# Patient Record
Sex: Male | Born: 1950 | Race: White | Hispanic: No | Marital: Married | Smoking: Former smoker
Health system: Southern US, Community
[De-identification: ages and names within clinical notes are randomized; demographics above are authoritative.]

## PROBLEM LIST (undated history)

## (undated) DIAGNOSIS — D1391 Familial adenomatous polyposis: Secondary | ICD-10-CM

## (undated) DIAGNOSIS — Z972 Presence of dental prosthetic device (complete) (partial): Secondary | ICD-10-CM

## (undated) DIAGNOSIS — I10 Essential (primary) hypertension: Secondary | ICD-10-CM

## (undated) DIAGNOSIS — D126 Benign neoplasm of colon, unspecified: Secondary | ICD-10-CM

## (undated) DIAGNOSIS — Z7901 Long term (current) use of anticoagulants: Secondary | ICD-10-CM

## (undated) DIAGNOSIS — E0590 Thyrotoxicosis, unspecified without thyrotoxic crisis or storm: Secondary | ICD-10-CM

## (undated) DIAGNOSIS — I2510 Atherosclerotic heart disease of native coronary artery without angina pectoris: Secondary | ICD-10-CM

## (undated) DIAGNOSIS — C61 Malignant neoplasm of prostate: Secondary | ICD-10-CM

## (undated) DIAGNOSIS — E210 Primary hyperparathyroidism: Secondary | ICD-10-CM

## (undated) DIAGNOSIS — H409 Unspecified glaucoma: Secondary | ICD-10-CM

## (undated) DIAGNOSIS — Z860100 Personal history of colon polyps, unspecified: Secondary | ICD-10-CM

## (undated) DIAGNOSIS — Z955 Presence of coronary angioplasty implant and graft: Secondary | ICD-10-CM

## (undated) DIAGNOSIS — Z803 Family history of malignant neoplasm of breast: Secondary | ICD-10-CM

## (undated) DIAGNOSIS — M1990 Unspecified osteoarthritis, unspecified site: Secondary | ICD-10-CM

## (undated) DIAGNOSIS — E785 Hyperlipidemia, unspecified: Secondary | ICD-10-CM

## (undated) DIAGNOSIS — K635 Polyp of colon: Secondary | ICD-10-CM

## (undated) DIAGNOSIS — I219 Acute myocardial infarction, unspecified: Secondary | ICD-10-CM

## (undated) HISTORY — DX: Polyp of colon: K63.5

## (undated) HISTORY — DX: Hyperlipidemia, unspecified: E78.5

## (undated) HISTORY — PX: APPENDECTOMY: SHX54

## (undated) HISTORY — PX: COLONOSCOPY: SHX174

## (undated) HISTORY — PX: TONSILLECTOMY: SUR1361

## (undated) HISTORY — DX: Essential (primary) hypertension: I10

## (undated) HISTORY — DX: Family history of malignant neoplasm of breast: Z80.3

## (undated) HISTORY — DX: Acute myocardial infarction, unspecified: I21.9

## (undated) HISTORY — DX: Familial adenomatous polyposis: D13.91

## (undated) HISTORY — DX: Benign neoplasm of colon, unspecified: D12.6

---
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
## 1972-03-18 HISTORY — PX: APPENDECTOMY: SHX54

## 2007-03-19 DIAGNOSIS — I219 Acute myocardial infarction, unspecified: Secondary | ICD-10-CM

## 2007-03-19 DIAGNOSIS — I252 Old myocardial infarction: Secondary | ICD-10-CM

## 2007-03-19 HISTORY — PX: CORONARY ANGIOPLASTY WITH STENT PLACEMENT: SHX49

## 2007-03-19 HISTORY — DX: Acute myocardial infarction, unspecified: I21.9

## 2007-03-19 HISTORY — DX: Old myocardial infarction: I25.2

## 2008-03-18 DIAGNOSIS — Z955 Presence of coronary angioplasty implant and graft: Secondary | ICD-10-CM

## 2008-03-18 HISTORY — DX: Presence of coronary angioplasty implant and graft: Z95.5

## 2008-03-18 HISTORY — PX: CORONARY ANGIOPLASTY WITH STENT PLACEMENT: SHX49

## 2016-02-06 ENCOUNTER — Encounter: Payer: Self-pay | Admitting: Family Medicine

## 2016-02-06 ENCOUNTER — Encounter: Payer: Self-pay | Admitting: Gastroenterology

## 2016-02-06 ENCOUNTER — Ambulatory Visit (INDEPENDENT_AMBULATORY_CARE_PROVIDER_SITE_OTHER): Payer: Medicare Other | Admitting: Family Medicine

## 2016-02-06 VITALS — BP 180/110 | HR 94 | Temp 98.3°F | Resp 16 | Ht 70.0 in | Wt 197.0 lb

## 2016-02-06 DIAGNOSIS — Z7689 Persons encountering health services in other specified circumstances: Secondary | ICD-10-CM

## 2016-02-06 DIAGNOSIS — I252 Old myocardial infarction: Secondary | ICD-10-CM | POA: Diagnosis not present

## 2016-02-06 DIAGNOSIS — Z1211 Encounter for screening for malignant neoplasm of colon: Secondary | ICD-10-CM | POA: Diagnosis not present

## 2016-02-06 DIAGNOSIS — I10 Essential (primary) hypertension: Secondary | ICD-10-CM

## 2016-02-06 DIAGNOSIS — Z125 Encounter for screening for malignant neoplasm of prostate: Secondary | ICD-10-CM | POA: Diagnosis not present

## 2016-02-06 DIAGNOSIS — Z23 Encounter for immunization: Secondary | ICD-10-CM | POA: Diagnosis not present

## 2016-02-06 DIAGNOSIS — I2510 Atherosclerotic heart disease of native coronary artery without angina pectoris: Secondary | ICD-10-CM | POA: Insufficient documentation

## 2016-02-06 LAB — CBC WITH DIFFERENTIAL/PLATELET
BASOS ABS: 0 {cells}/uL (ref 0–200)
BASOS PCT: 0 %
EOS ABS: 384 {cells}/uL (ref 15–500)
Eosinophils Relative: 4 %
HCT: 48.1 % (ref 38.5–50.0)
HEMOGLOBIN: 16.1 g/dL (ref 13.0–17.0)
LYMPHS ABS: 2592 {cells}/uL (ref 850–3900)
Lymphocytes Relative: 27 %
MCH: 30 pg (ref 27.0–33.0)
MCHC: 33.5 g/dL (ref 32.0–36.0)
MCV: 89.7 fL (ref 80.0–100.0)
MONO ABS: 1632 {cells}/uL — AB (ref 200–950)
MPV: 10.7 fL (ref 7.5–12.5)
Monocytes Relative: 17 %
NEUTROS ABS: 4992 {cells}/uL (ref 1500–7800)
Neutrophils Relative %: 52 %
PLATELETS: 268 10*3/uL (ref 140–400)
RBC: 5.36 MIL/uL (ref 4.20–5.80)
RDW: 13.7 % (ref 11.0–15.0)
WBC: 9.6 10*3/uL (ref 3.8–10.8)

## 2016-02-06 LAB — COMPLETE METABOLIC PANEL WITH GFR
ALBUMIN: 4.3 g/dL (ref 3.6–5.1)
ALK PHOS: 73 U/L (ref 40–115)
ALT: 21 U/L (ref 9–46)
AST: 17 U/L (ref 10–35)
BILIRUBIN TOTAL: 0.4 mg/dL (ref 0.2–1.2)
BUN: 14 mg/dL (ref 7–25)
CO2: 28 mmol/L (ref 20–31)
CREATININE: 0.72 mg/dL (ref 0.70–1.25)
Calcium: 11.4 mg/dL — ABNORMAL HIGH (ref 8.6–10.3)
Chloride: 107 mmol/L (ref 98–110)
GLUCOSE: 100 mg/dL — AB (ref 70–99)
Potassium: 4.7 mmol/L (ref 3.5–5.3)
Sodium: 142 mmol/L (ref 135–146)
TOTAL PROTEIN: 6.9 g/dL (ref 6.1–8.1)

## 2016-02-06 LAB — LIPID PANEL
Cholesterol: 180 mg/dL (ref <200)
HDL: 66 mg/dL (ref >40)
LDL CALC: 79 mg/dL (ref <100)
TRIGLYCERIDES: 175 mg/dL — AB (ref <150)
Total CHOL/HDL Ratio: 2.7 Ratio (ref <5.0)
VLDL: 35 mg/dL — ABNORMAL HIGH (ref <30)

## 2016-02-06 LAB — PSA: PSA: 4.1 ng/mL — AB (ref <=4.0)

## 2016-02-06 MED ORDER — SIMVASTATIN 40 MG PO TABS: 40.0000 mg | ORAL_TABLET | Freq: Every day | ORAL | 3 refills | Status: DC

## 2016-02-06 MED ORDER — LISINOPRIL 20 MG PO TABS: 20.0000 mg | ORAL_TABLET | Freq: Every day | ORAL | 3 refills | Status: DC

## 2016-02-06 MED ORDER — CARVEDILOL 6.25 MG PO TABS: 6.2500 mg | ORAL_TABLET | Freq: Two times a day (BID) | ORAL | 3 refills | Status: DC

## 2016-02-06 NOTE — Progress Notes (Signed)
Subjective:    Patient ID: Todd Patterson, male    DOB: Jun 16, 1950, 65 y.o.   MRN: [id number]  HPI Patient is a very pleasant 65 year old white male here today to establish care. Past medical history is significant for myocardial infarction in 2008. Patient underwent PTCA with stenting.  Patient states that he had a bare-metal stent. He was continued on aspirin and Plavix for 2 years and then was instructed to continue aspirin thereafter. He also has a history of hyperlipidemia and hypertension.  Blood pressure is elevated today significantly. Patient has not taking carvedilol this morning. He is never had prostate cancer screening. He has never had a colonoscopy. He has never had Pneumovax 23 N. Prevnar 13. He is due for a shingles shot. He is also due for a flu shot. History reviewed. No pertinent past medical history. History reviewed. No pertinent surgical history. No current outpatient prescriptions on file prior to visit.   No current facility-administered medications on file prior to visit.    No Known Allergies Social History   Social History  . Marital status: Married    Spouse name: N/A  . Number of children: N/A  . Years of education: N/A   Occupational History  . Not on file.   Social History Main Topics  . Smoking status: Former Smoker    Types: Cigarettes    Quit date: 03/19/2007  . Smokeless tobacco: Never Used  . Alcohol use 1.2 oz/week    2 Cans of beer per week  . Drug use: No  . Sexual activity: Not on file   Other Topics Concern  . Not on file   Social History Narrative  . No narrative on file   History reviewed. No pertinent family history.    Review of Systems  All other systems reviewed and are negative.      Objective:   Physical Exam  Constitutional: He is oriented to person, place, and time. He appears well-developed and well-nourished. No distress.  HENT:  Head: Normocephalic and atraumatic.  Right Ear: External ear normal.  Left Ear:  External ear normal.  Nose: Nose normal.  Mouth/Throat: Oropharynx is clear and moist. No oropharyngeal exudate.  Eyes: Conjunctivae and EOM are normal. Pupils are equal, round, and reactive to light. Right eye exhibits no discharge. Left eye exhibits no discharge. No scleral icterus.  Neck: Normal range of motion. Neck supple. No JVD present. No tracheal deviation present. No thyromegaly present.  Cardiovascular: Normal rate, regular rhythm, normal heart sounds and intact distal pulses.  Exam reveals no gallop and no friction rub.   No murmur heard. Pulmonary/Chest: Effort normal and breath sounds normal. No stridor. No respiratory distress. He has no wheezes. He has no rales. He exhibits no tenderness.  Abdominal: Soft. Bowel sounds are normal. He exhibits no distension and no mass. There is no tenderness. There is no rebound and no guarding.  Musculoskeletal: Normal range of motion. He exhibits no edema, tenderness or deformity.  Lymphadenopathy:    He has no cervical adenopathy.  Neurological: He is alert and oriented to person, place, and time. He has normal reflexes. He displays normal reflexes. No cranial nerve deficit. He exhibits normal muscle tone. Coordination normal.  Skin: Skin is warm. No rash noted. He is not diaphoretic. No erythema. No pallor.  Psychiatric: He has a normal mood and affect. His behavior is normal. Judgment and thought content normal.  Vitals reviewed.         Assessment & Plan:  Benign essential HTN - Plan: lisinopril (PRINIVIL,ZESTRIL) 20 MG tablet, CBC with Differential/Platelet, COMPLETE METABOLIC PANEL WITH GFR, Lipid panel  Prostate cancer screening - Plan: PSA  Need for prophylactic vaccination against Streptococcus pneumoniae (pneumococcus) and influenza - Plan: Flu Vaccine QUAD 36+ mos IM, Pneumococcal polysaccharide vaccine 23-valent greater than or equal to 2yo subcutaneous/IM  Establishing care with new doctor, encounter for  ASCVD  (arteriosclerotic cardiovascular disease) - Plan: Ambulatory referral to Cardiology  History of MI (myocardial infarction) - Plan: Ambulatory referral to Cardiology  Colon cancer screening - Plan: Ambulatory referral to Gastroenterology  First concern is his elevated blood pressure. Begin lisinopril 20 mg by mouth daily and recheck blood pressure in 2 weeks. I would like to consult cardiology to get him established with a local cardiologist. I'll check a CMP and a fasting lipid panel. His goal LDL cholesterol be less than 70. If not at goal, I will switch the patient to high-dose statin such as Lipitor or Crestor. Patient received Pneumovax 23 today. I will give him Prevnar 13 next year. He also received his flu shot. We discussed the shingles shot but he deferred that for now. I will schedule the patient to meet with the gastroenterologist to schedule a colonoscopy. I will check a PSA for prostate cancer screening. Patient also due for AAA screening, will defer that for now until the patient gets established locally with cardiologist

## 2016-02-15 ENCOUNTER — Other Ambulatory Visit: Payer: Self-pay | Admitting: Family Medicine

## 2016-02-19 ENCOUNTER — Other Ambulatory Visit: Payer: Medicare Other

## 2016-02-20 LAB — PTH, INTACT AND CALCIUM
Calcium: 11.1 mg/dL — ABNORMAL HIGH (ref 8.6–10.3)
PTH: 108 pg/mL — ABNORMAL HIGH (ref 14–64)

## 2016-02-26 ENCOUNTER — Encounter: Payer: Self-pay | Admitting: Family Medicine

## 2016-02-26 ENCOUNTER — Ambulatory Visit (INDEPENDENT_AMBULATORY_CARE_PROVIDER_SITE_OTHER): Payer: Medicare Other | Admitting: Family Medicine

## 2016-02-26 VITALS — BP 130/80 | HR 82 | Temp 98.7°F | Resp 18 | Wt 196.0 lb

## 2016-02-26 DIAGNOSIS — R972 Elevated prostate specific antigen [PSA]: Secondary | ICD-10-CM

## 2016-02-26 DIAGNOSIS — I2510 Atherosclerotic heart disease of native coronary artery without angina pectoris: Secondary | ICD-10-CM | POA: Diagnosis not present

## 2016-02-26 DIAGNOSIS — R059 Cough, unspecified: Secondary | ICD-10-CM

## 2016-02-26 DIAGNOSIS — R05 Cough: Secondary | ICD-10-CM

## 2016-02-26 DIAGNOSIS — E213 Hyperparathyroidism, unspecified: Secondary | ICD-10-CM

## 2016-02-26 MED ORDER — HYDROCODONE-HOMATROPINE 5-1.5 MG/5ML PO SYRP: 5.0000 mL | ORAL_SOLUTION | Freq: Three times a day (TID) | ORAL | 0 refills | Status: DC | PRN

## 2016-02-26 NOTE — Progress Notes (Signed)
Subjective:    Patient ID: Todd Patterson, male    DOB: 1950/12/18, 65 y.o.   MRN: [id number]  HPI  Please see the patient's last office visit when he establish care. There was a coincidental finding of an elevated calcium level of 11.4. I had the patient recheck that here and it was confirmed to be elevated 11.1 and his PTH was also elevated at 108. This is consistent with hyperparathyroidism/primary hyperparathyroidism. Patient would like to discuss this prior see an endocrinologist. He has no history of kidney stones. He has no history of osteoporosis that he knows of. He is not taking calcium supplements or any medication that would raise his calcium. He was also found to have an elevated PSA of 4.1. He has not seen a primary care physician in years and therefore no one has been checking his PSA so we have no values to compare to. Otherwise he is completely asymptomatic. He, hesitancy, frequency, or nocturia. He also reports a 5 day history of cough and congestion and rhinorrhea. He denies any shortness of breath or chest pain or fever. Past Medical History:  Diagnosis Date  . CAD (coronary artery disease)   . Hyperlipidemia   . Hypertension   . Myocardial infarction    Past Surgical History:  Procedure Laterality Date  . APPENDECTOMY    . CORONARY ANGIOPLASTY WITH STENT PLACEMENT     Current Outpatient Prescriptions on File Prior to Visit  Medication Sig Dispense Refill  . aspirin 325 MG tablet Take 325 mg by mouth daily.    . carvedilol (COREG) 6.25 MG tablet Take 1 tablet (6.25 mg total) by mouth 2 (two) times daily with a meal. 180 tablet 3  . lisinopril (PRINIVIL,ZESTRIL) 20 MG tablet Take 1 tablet (20 mg total) by mouth daily. 90 tablet 3  . simvastatin (ZOCOR) 40 MG tablet Take 1 tablet (40 mg total) by mouth daily. 90 tablet 3   No current facility-administered medications on file prior to visit.    No Known Allergies Social History   Social History  . Marital status:  Married    Spouse name: N/A  . Number of children: N/A  . Years of education: N/A   Occupational History  . Not on file.   Social History Main Topics  . Smoking status: Former Smoker    Types: Cigarettes    Quit date: 03/19/2007  . Smokeless tobacco: Never Used  . Alcohol use 1.2 oz/week    2 Cans of beer per week  . Drug use: No  . Sexual activity: Not on file   Other Topics Concern  . Not on file   Social History Narrative  . No narrative on file     Review of Systems  All other systems reviewed and are negative.      Objective:   Physical Exam  HENT:  Right Ear: External ear normal.  Left Ear: External ear normal.  Nose: Mucosal edema and rhinorrhea present.  Mouth/Throat: Oropharynx is clear and moist. No oropharyngeal exudate.  Neck: Neck supple.  Cardiovascular: Normal rate, regular rhythm and normal heart sounds.   Pulmonary/Chest: Effort normal and breath sounds normal. No respiratory distress. He has no wheezes. He has no rales.  Vitals reviewed.         Assessment & Plan:  Cough - Plan: HYDROcodone-homatropine (HYCODAN) 5-1.5 MG/5ML syrup  Hyperparathyroidism ([place]) - Plan: Ambulatory referral to Endocrinology  Elevated PSA  Patient's cough is consistent with a viral upper respiratory infection. I  recommended Mucinex DM. He can use Hycodan as needed for breakthrough coughing. Symptoms should improve over the next 3-4 days. I see no indication for serious bacterial illness. 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 I will consult an endocrinologist to discuss management options such as surgery versus monitoring and bisphosphonate therapy. Patient is asymptomatic. Therefore recommended that we recheck his PSA in 6 months to follow his trajectory. If rapidly rising, he would benefit from urology consultation.

## 2016-02-27 ENCOUNTER — Ambulatory Visit: Payer: Medicare Other | Admitting: Family Medicine

## 2016-03-19 ENCOUNTER — Other Ambulatory Visit: Payer: Self-pay | Admitting: *Deleted

## 2016-03-19 ENCOUNTER — Telehealth: Payer: Self-pay | Admitting: Family Medicine

## 2016-03-19 DIAGNOSIS — I10 Essential (primary) hypertension: Secondary | ICD-10-CM

## 2016-03-19 MED ORDER — LISINOPRIL 20 MG PO TABS: 20.0000 mg | ORAL_TABLET | Freq: Every day | ORAL | 3 refills | Status: DC

## 2016-03-19 NOTE — Telephone Encounter (Signed)
Medication called/sent to requested pharmacy  

## 2016-03-19 NOTE — Telephone Encounter (Signed)
[facility] does not have lisinopril on file for patient so if you can call this in for him in a three month supply  [phone]

## 2016-03-26 ENCOUNTER — Ambulatory Visit (AMBULATORY_SURGERY_CENTER): Payer: Self-pay

## 2016-03-26 VITALS — Ht 69.0 in | Wt 196.6 lb

## 2016-03-26 DIAGNOSIS — Z1211 Encounter for screening for malignant neoplasm of colon: Secondary | ICD-10-CM

## 2016-03-26 MED ORDER — NA SULFATE-K SULFATE-MG SULF 17.5-3.13-1.6 GM/177ML PO SOLN: ORAL | 0 refills | Status: DC

## 2016-03-26 NOTE — Progress Notes (Signed)
Per pt, no allergies to soy or egg products.Pt not taking any weight loss meds or using  O2 at home. 

## 2016-03-27 ENCOUNTER — Encounter: Payer: Self-pay | Admitting: Gastroenterology

## 2016-04-04 ENCOUNTER — Ambulatory Visit: Payer: Medicare HMO | Admitting: Cardiology

## 2016-04-09 ENCOUNTER — Other Ambulatory Visit: Payer: Self-pay | Admitting: Internal Medicine

## 2016-04-09 ENCOUNTER — Encounter: Payer: Self-pay | Admitting: Gastroenterology

## 2016-04-09 DIAGNOSIS — R7989 Other specified abnormal findings of blood chemistry: Secondary | ICD-10-CM

## 2016-04-09 DIAGNOSIS — E213 Hyperparathyroidism, unspecified: Secondary | ICD-10-CM | POA: Diagnosis not present

## 2016-04-15 ENCOUNTER — Encounter (HOSPITAL_COMMUNITY): Payer: Medicare HMO

## 2016-04-15 ENCOUNTER — Other Ambulatory Visit (HOSPITAL_COMMUNITY): Payer: Medicare HMO

## 2016-04-15 ENCOUNTER — Telehealth: Payer: Self-pay | Admitting: Cardiovascular Disease

## 2016-04-16 ENCOUNTER — Encounter (HOSPITAL_COMMUNITY)
Admission: RE | Admit: 2016-04-16 | Discharge: 2016-04-16 | Disposition: A | Discharge status: Home / Self Care | Payer: Medicare HMO | Source: Ambulatory Visit | Attending: Internal Medicine | Admitting: Internal Medicine

## 2016-04-16 DIAGNOSIS — R7989 Other specified abnormal findings of blood chemistry: Secondary | ICD-10-CM

## 2016-04-16 DIAGNOSIS — E215 Disorder of parathyroid gland, unspecified: Secondary | ICD-10-CM | POA: Diagnosis present

## 2016-04-16 DIAGNOSIS — E213 Hyperparathyroidism, unspecified: Secondary | ICD-10-CM | POA: Diagnosis not present

## 2016-04-16 MED ORDER — TECHNETIUM TC 99M SESTAMIBI GENERIC - CARDIOLITE
25.0000 | Freq: Once | INTRAVENOUS | Status: AC | PRN
  Administered 2016-04-16: 25 via INTRAVENOUS

## 2016-04-22 DIAGNOSIS — E213 Hyperparathyroidism, unspecified: Secondary | ICD-10-CM | POA: Diagnosis not present

## 2016-04-23 ENCOUNTER — Ambulatory Visit: Payer: Medicare HMO | Admitting: Cardiovascular Disease

## 2016-04-24 ENCOUNTER — Encounter: Payer: Self-pay | Admitting: Gastroenterology

## 2016-04-24 ENCOUNTER — Ambulatory Visit (AMBULATORY_SURGERY_CENTER): Payer: Medicare HMO | Admitting: Gastroenterology

## 2016-04-24 VITALS — BP 148/80 | HR 57 | Temp 99.1°F | Resp 14 | Ht 69.0 in | Wt 196.0 lb

## 2016-04-24 DIAGNOSIS — D121 Benign neoplasm of appendix: Secondary | ICD-10-CM

## 2016-04-24 DIAGNOSIS — D124 Benign neoplasm of descending colon: Secondary | ICD-10-CM | POA: Diagnosis not present

## 2016-04-24 DIAGNOSIS — D120 Benign neoplasm of cecum: Secondary | ICD-10-CM | POA: Diagnosis not present

## 2016-04-24 DIAGNOSIS — D125 Benign neoplasm of sigmoid colon: Secondary | ICD-10-CM | POA: Diagnosis not present

## 2016-04-24 DIAGNOSIS — D122 Benign neoplasm of ascending colon: Secondary | ICD-10-CM | POA: Diagnosis not present

## 2016-04-24 DIAGNOSIS — Z1212 Encounter for screening for malignant neoplasm of rectum: Secondary | ICD-10-CM | POA: Diagnosis not present

## 2016-04-24 DIAGNOSIS — D123 Benign neoplasm of transverse colon: Secondary | ICD-10-CM | POA: Diagnosis not present

## 2016-04-24 DIAGNOSIS — Z1211 Encounter for screening for malignant neoplasm of colon: Secondary | ICD-10-CM | POA: Diagnosis not present

## 2016-04-24 DIAGNOSIS — K635 Polyp of colon: Secondary | ICD-10-CM

## 2016-04-24 MED ORDER — SODIUM CHLORIDE 0.9 % IV SOLN: 500.0000 mL | INTRAVENOUS | Status: DC

## 2016-04-24 NOTE — Op Note (Signed)
[place] Patient Name: Kolston Huso Procedure Date: 04/24/2016 10:52 AM MRN: [id number] Endoscopist: Mauri Pole , MD Age: 66 Referring MD:  Date of Birth: March 16, 1951 Gender: Male Account #: [id number] Procedure:                Colonoscopy Indications:              Screening for colorectal malignant neoplasm, This                            is the patient's first colonoscopy Medicines:                Monitored Anesthesia Care Procedure:                Pre-Anesthesia Assessment:                           - Prior to the procedure, a History and Physical                            was performed, and patient medications and                            allergies were reviewed. The patient's tolerance of                            previous anesthesia was also reviewed. The risks                            and benefits of the procedure and the sedation                            options and risks were discussed with the patient.                            All questions were answered, and informed consent                            was obtained. Prior Anticoagulants: The patient has                            taken no previous anticoagulant or antiplatelet                            agents. ASA Grade Assessment: II - A patient with                            mild systemic disease. After reviewing the risks                            and benefits, the patient was deemed in                            satisfactory condition to undergo the procedure.  After obtaining informed consent, the colonoscope                            was passed under direct vision. Throughout the                            procedure, the patient's blood pressure, pulse, and                            oxygen saturations were monitored continuously. The                            Model CF-HQ190L [id number]) scope was introduced                            through the anus and  advanced to the the terminal                            ileum, with identification of the appendiceal                            orifice and IC valve. The colonoscopy was                            technically difficult and complex due to the                            patient's body habitus, the patient's oxygen                            desaturation and ineffective sedation. Successful                            completion of the procedure was aided by applying                            abdominal pressure. The patient tolerated the                            procedure well. The quality of the bowel                            preparation was excellent. The terminal ileum,                            ileocecal valve, appendiceal orifice, and rectum                            were photographed. Scope In: 10:59:29 AM Scope Out: 11:41:13 AM Scope Withdrawal Time: 0 hours 33 minutes 48 seconds  Total Procedure Duration: 0 hours 41 minutes 44 seconds  Findings:                 The perianal and digital rectal examinations were  normal.                           A 4 mm polyp was found in the appendiceal orifice.                            The polyp was sessile. The polyp was removed with a                            cold snare. Resection and retrieval were complete.                           A 40 mm polypoid lesion was found in the cecum just                            behind the ileocecal valve. The lesion was                            multi-lobulated, friable and was bleeding with                            brisk ooze at biopsy sites. Mucosa was biopsied                            with a cold forceps for histology. One specimen                            bottle was sent to pathology.                           Four sessile to semi-pedunculated polyps were found                            in the sigmoid colon x1, descending colon x2 and                             transverse colon x1 . The polyps were 15 to 20 mm                            in size. These polyps were removed with a hot                            snare. Resection and retrieval were complete.                           Two sessile polyps were found in the sigmoid colon                            X1 and ascending colon x1. The polyps were 7 to 9                            mm in size. These polyps were removed with a  cold                            snare. Resection and retrieval were complete.                           Non-bleeding internal hemorrhoids were found during                            retroflexion. The hemorrhoids were small. Complications:            No immediate complications. Estimated Blood Loss:     Estimated blood loss was minimal. Impression:               - One 4 mm polyp at the appendiceal orifice,                            removed with a cold snare. Resected and retrieved.                           - Rule out malignancy, polypoid lesion in the                            cecum. Biopsied.                           - Four 15 to 20 mm polyps in the sigmoid colon, in                            the descending colon and in the transverse colon,                            removed with a hot snare. Resected and retrieved.                           - Two 7 to 9 mm polyps in the sigmoid colon and in                            the ascending colon, removed with a cold snare.                            Resected and retrieved.                           - Non-bleeding internal hemorrhoids. Recommendation:           - Patient has a contact number available for                            emergencies. The signs and symptoms of potential                            delayed complications were discussed with the  patient. Return to normal activities tomorrow.                            Written discharge instructions were provided to the                             patient.                           - Resume previous diet.                           - Continue present medications.                           - No aspirin, ibuprofen, naproxen, or other                            non-steroidal anti-inflammatory drugs for 2 weeks.                           - Await pathology results.                           - Repeat colonoscopy is recommended for                            surveillance of multiple polyps and also removal of                            large cecal polyp. The colonoscopy date will be                            determined after pathology results from today's                            exam become available for review.                           - Return to GI clinic PRN. Mauri Pole, MD 04/24/2016 11:51:55 AM This report has been signed electronically.

## 2016-04-24 NOTE — Patient Instructions (Signed)
Discharge instructions given. Handouts on polyps and hemorrhoids. No aspirin,ibuprofen,naproxen,or other non-steroidal anti-inflammatory drugs for 2 weeks. Resume previous medications. YOU HAD AN ENDOSCOPIC PROCEDURE TODAY AT [facility]:   Refer to the procedure report that was given to you for any specific questions about what was found during the examination.  If the procedure report does not answer your questions, please call your gastroenterologist to clarify.  If you requested that your care partner not be given the details of your procedure findings, then the procedure report has been included in a sealed envelope for you to review at your convenience later.  YOU SHOULD EXPECT: Some feelings of bloating in the abdomen. Passage of more gas than usual.  Walking can help get rid of the air that was put into your GI tract during the procedure and reduce the bloating. If you had a lower endoscopy (such as a colonoscopy or flexible sigmoidoscopy) you may notice spotting of blood in your stool or on the toilet paper. If you underwent a bowel prep for your procedure, you may not have a normal bowel movement for a few days.  Please Note:  You might notice some irritation and congestion in your nose or some drainage.  This is from the oxygen used during your procedure.  There is no need for concern and it should clear up in a day or so.  SYMPTOMS TO REPORT IMMEDIATELY:   Following lower endoscopy (colonoscopy or flexible sigmoidoscopy):  Excessive amounts of blood in the stool  Significant tenderness or worsening of abdominal pains  Swelling of the abdomen that is new, acute  Fever of 100F or higher  For urgent or emergent issues, a gastroenterologist can be reached at any hour by calling [phone].   DIET:  We do recommend a small meal at first, but then you may proceed to your regular diet.  Drink plenty of fluids but you should avoid alcoholic beverages for 24  hours.  ACTIVITY:  You should plan to take it easy for the rest of today and you should NOT DRIVE or use heavy machinery until tomorrow (because of the sedation medicines used during the test).    FOLLOW UP: Our staff will call the number listed on your records the next business day following your procedure to check on you and address any questions or concerns that you may have regarding the information given to you following your procedure. If we do not reach you, we will leave a message.  However, if you are feeling well and you are not experiencing any problems, there is no need to return our call.  We will assume that you have returned to your regular daily activities without incident.  If any biopsies were taken you will be contacted by phone or by letter within the next 1-3 weeks.  Please call us at [phone] if you have not heard about the biopsies in 3 weeks.    SIGNATURES/CONFIDENTIALITY: You and/or your care partner have signed paperwork which will be entered into your electronic medical record.  These signatures attest to the fact that that the information above on your After Visit Summary has been reviewed and is understood.  Full responsibility of the confidentiality of this discharge information lies with you and/or your care-partner.

## 2016-04-24 NOTE — Progress Notes (Signed)
Called to room to assist during endoscopic procedure.  Patient ID and intended procedure confirmed with present staff. Received instructions for my participation in the procedure from the performing physician.  

## 2016-04-24 NOTE — Progress Notes (Signed)
1150 called [facility] TO PICK UP [facility] SPECIMEN.

## 2016-04-24 NOTE — Progress Notes (Signed)
A and O x3. Report to RN. Tolerated MAC anesthesia well.

## 2016-04-24 NOTE — Progress Notes (Signed)
Patient's pathology to be sent [facility], pe Dr. Silverio Decamp.

## 2016-04-25 ENCOUNTER — Telehealth: Payer: Self-pay | Admitting: *Deleted

## 2016-04-25 NOTE — Telephone Encounter (Signed)
  Follow up Call-  Call back number 04/24/2016  Post procedure Call Back phone  # [phone]  Permission to leave phone message Yes  Some recent data might be hidden     Patient questions:  Do you have a fever, pain , or abdominal swelling? No. Pain Score  0 *  Have you tolerated food without any problems? Yes.    Have you been able to return to your normal activities? Yes.    Do you have any questions about your discharge instructions: Diet   No. Medications  No. Follow up visit  No.  Do you have questions or concerns about your Care? No.  Actions: * If pain score is 4 or above: No action needed, pain <4.

## 2016-04-26 ENCOUNTER — Ambulatory Visit (INDEPENDENT_AMBULATORY_CARE_PROVIDER_SITE_OTHER): Payer: Medicare HMO | Admitting: Cardiovascular Disease

## 2016-04-26 ENCOUNTER — Encounter: Payer: Self-pay | Admitting: Cardiovascular Disease

## 2016-04-26 VITALS — BP 148/80 | HR 68 | Ht 69.0 in | Wt 195.6 lb

## 2016-04-26 DIAGNOSIS — E785 Hyperlipidemia, unspecified: Secondary | ICD-10-CM | POA: Insufficient documentation

## 2016-04-26 DIAGNOSIS — I10 Essential (primary) hypertension: Secondary | ICD-10-CM | POA: Diagnosis not present

## 2016-04-26 DIAGNOSIS — I2510 Atherosclerotic heart disease of native coronary artery without angina pectoris: Secondary | ICD-10-CM | POA: Diagnosis not present

## 2016-04-26 DIAGNOSIS — E7800 Pure hypercholesterolemia, unspecified: Secondary | ICD-10-CM

## 2016-04-26 DIAGNOSIS — I270 Primary pulmonary hypertension: Secondary | ICD-10-CM | POA: Insufficient documentation

## 2016-04-26 NOTE — Progress Notes (Signed)
04/26/2016 Todd Patterson   Jun 20, 1950  [id number]  Primary Physician Odette Fraction, MD Primary Cardiologist: Lorretta Harp MD Renae Gloss  HPI:  Todd Patterson is a delightful 66 year old mildly overweight married Caucasian male father of 2 daughters, grandfather of 4 grandchildren who was referred by Dr. Dennard Schaumann to be established in our cardiovascular practice for ongoing care. He has a history of treated hypertension and hyperlipidemia as well as discontinue tobacco abuse. He smoked 2 packs a day up until 2010 when he had his heart attack (80 pack years). He also takes several beers a day. He had a heart attack back in 2000 and had LAD stenting in [place] with a drug-eluting stent. He was followed by cardiologist in [place] after that who did a stress echo that was apparently normal. He was relocated from [place] to [place] 7 months ago to retiring to be closer to family. He is completely symptomatic.   Current Outpatient Prescriptions  Medication Sig Dispense Refill  . aspirin 325 MG tablet Take 325 mg by mouth daily.    . carvedilol (COREG) 6.25 MG tablet Take 1 tablet (6.25 mg total) by mouth 2 (two) times daily with a meal. 180 tablet 3  . HYDROcodone-homatropine (HYCODAN) 5-1.5 MG/5ML syrup Take 5 mLs by mouth every 8 (eight) hours as needed for cough. 120 mL 0  . ibuprofen (ADVIL,MOTRIN) 200 MG tablet Take 200 mg by mouth as needed.    Marland Kitchen lisinopril (PRINIVIL,ZESTRIL) 20 MG tablet Take 1 tablet (20 mg total) by mouth daily. 90 tablet 3  . simvastatin (ZOCOR) 40 MG tablet Take 1 tablet (40 mg total) by mouth daily. 90 tablet 3   Current Facility-Administered Medications  Medication Dose Route Frequency Provider Last Rate Last Dose  . 0.9 %  sodium chloride infusion  500 mL Intravenous Continuous Mauri Pole, MD        No Known Allergies  Social History   Social History  . Marital status: Married    Spouse name: N/A  . Number of children: N/A  . Years  of education: N/A   Occupational History  . Not on file.   Social History Main Topics  . Smoking status: Former Smoker    Types: Cigarettes    Quit date: 03/19/2007  . Smokeless tobacco: Never Used  . Alcohol use 7.2 - 8.4 oz/week    12 - 14 Cans of beer per week  . Drug use: No  . Sexual activity: Not on file   Other Topics Concern  . Not on file   Social History Narrative  . No narrative on file     Review of Systems: General: negative for chills, fever, night sweats or weight changes.  Cardiovascular: negative for chest pain, dyspnea on exertion, edema, orthopnea, palpitations, paroxysmal nocturnal dyspnea or shortness of breath Dermatological: negative for rash Respiratory: negative for cough or wheezing Urologic: negative for hematuria Abdominal: negative for nausea, vomiting, diarrhea, bright red blood per rectum, melena, or hematemesis Neurologic: negative for visual changes, syncope, or dizziness All other systems reviewed and are otherwise negative except as noted above.    Blood pressure (!) 148/80, pulse 68, height 5\' 9"  (1.753 m), weight 195 lb 9.6 oz (88.7 kg), SpO2 95 %.  General appearance: alert and no distress Neck: no adenopathy, no carotid bruit, no JVD, supple, symmetrical, trachea midline and thyroid not enlarged, symmetric, no tenderness/mass/nodules Lungs: clear to auscultation bilaterally Heart: regular rate and rhythm, S1, S2 normal, no murmur,  click, rub or gallop Extremities: extremities normal, atraumatic, no cyanosis or edema  EKG sinus rhythm at 68 with septal Q waves. I personally reviewed this EKG  ASSESSMENT AND PLAN:   CAD (coronary artery disease) History of CAD status post anterior MI, Roxanol LAD PCI and drug-eluting stenting in [place]. He was seen a cardiologist in [place] up until he recently moved to [place] 7 months ago. He had a stress echo which apparently was normal. He is totally asymptomatic. He was taken off Plavix 2  years after stent implantation.  Hyperlipidemia History of hyperlipidemia on statin therapy with recent lipid profile performed by his PCP 02/06/16 revealing total cholesterol 180, LDL 79 and HDL of 66.  Essential hypertension History of hypertension blood pressure measured 140/80. He is on carvedilol and lisinopril. Continue current meds at current dosing      Lorretta Harp MD [facility], [facility] 04/26/2016 9:50 AM

## 2016-04-26 NOTE — Assessment & Plan Note (Signed)
History of hypertension blood pressure measured 140/80. He is on carvedilol and lisinopril. Continue current meds at current dosing

## 2016-04-26 NOTE — Assessment & Plan Note (Signed)
History of CAD status post anterior MI, Roxanol LAD PCI and drug-eluting stenting in [place]. He was seen a cardiologist in [place] up until he recently moved to [place] 7 months ago. He had a stress echo which apparently was normal. He is totally asymptomatic. He was taken off Plavix 2 years after stent implantation.

## 2016-04-26 NOTE — Patient Instructions (Signed)

## 2016-04-26 NOTE — Assessment & Plan Note (Signed)
History of hyperlipidemia on statin therapy with recent lipid profile performed by his PCP 02/06/16 revealing total cholesterol 180, LDL 79 and HDL of 66.

## 2016-05-08 ENCOUNTER — Encounter: Payer: Medicare HMO | Admitting: Gastroenterology

## 2016-05-15 ENCOUNTER — Encounter: Payer: Self-pay | Admitting: Physician Assistant

## 2016-05-15 ENCOUNTER — Ambulatory Visit (INDEPENDENT_AMBULATORY_CARE_PROVIDER_SITE_OTHER): Payer: Medicare HMO | Admitting: Physician Assistant

## 2016-05-15 VITALS — BP 140/70 | HR 67 | Temp 98.1°F | Resp 14 | Wt 198.6 lb

## 2016-05-15 DIAGNOSIS — J988 Other specified respiratory disorders: Secondary | ICD-10-CM | POA: Diagnosis not present

## 2016-05-15 DIAGNOSIS — B9689 Other specified bacterial agents as the cause of diseases classified elsewhere: Principal | ICD-10-CM

## 2016-05-15 MED ORDER — AZITHROMYCIN 250 MG PO TABS: ORAL_TABLET | ORAL | 0 refills | Status: DC

## 2016-05-15 NOTE — Progress Notes (Signed)
Patient ID: Todd Patterson MRN: [id number], DOB: 07/03/1950, 66 y.o. Date of Encounter: 05/15/2016, 4:27 PM    Chief Complaint:  Chief Complaint  Patient presents with  . Sore Throat    goes up into ear  . Cough    x3weeks  . Nasal Congestion    a little     HPI: 66 y.o. year old male presents with above.   Initially he says that he has had a tickle and scratch in his throat for about 2-3 weeks. Using cough drops and Mucinex. Later in the conversation says that he had a bad cough but that went away and then this developed. Made mention of visit here--- reviewed chart-- that OV was back on December 11 that he reported having a cough-=- at that time was felt to be viral and was to treat with Mucinex and Hycodan.  He then says-- yes, that was right-- he did not realize it had been that long--- but that it started back then and now has persisted with this. He has had no mucus/drainage from his nose and does not feel congestion in his nose. Does feel drainage in his throat and coughs up phlegm. Has had no fever.    Home Meds:   Outpatient Medications Prior to Visit  Medication Sig Dispense Refill  . aspirin 325 MG tablet Take 325 mg by mouth daily.    . carvedilol (COREG) 6.25 MG tablet Take 1 tablet (6.25 mg total) by mouth 2 (two) times daily with a meal. 180 tablet 3  . HYDROcodone-homatropine (HYCODAN) 5-1.5 MG/5ML syrup Take 5 mLs by mouth every 8 (eight) hours as needed for cough. 120 mL 0  . ibuprofen (ADVIL,MOTRIN) 200 MG tablet Take 200 mg by mouth as needed.    Marland Kitchen lisinopril (PRINIVIL,ZESTRIL) 20 MG tablet Take 1 tablet (20 mg total) by mouth daily. 90 tablet 3  . simvastatin (ZOCOR) 40 MG tablet Take 1 tablet (40 mg total) by mouth daily. 90 tablet 3   Facility-Administered Medications Prior to Visit  Medication Dose Route Frequency Provider Last Rate Last Dose  . 0.9 %  sodium chloride infusion  500 mL Intravenous Continuous Kavitha Nandigam V, MD        Allergies: No  Known Allergies    Review of Systems: See HPI for pertinent ROS. All other ROS negative.    Physical Exam: Blood pressure 140/70, pulse 67, temperature 98.1 F (36.7 C), temperature source Oral, resp. rate 14, weight 198 lb 9.6 oz (90.1 kg), SpO2 98 %., Body mass index is 29.33 kg/m. General:  WNWD WM. Appears in no acute distress. HEENT: Normocephalic, atraumatic, eyes without discharge, sclera non-icteric, nares are without discharge. Bilateral auditory canals clear, TM's are without perforation, pearly grey and translucent with reflective cone of light bilaterally. Oral cavity moist, posterior pharynx without exudate, erythema, peritonsillar abscess.  Neck: Supple. No thyromegaly. No lymphadenopathy. Lungs: Clear bilaterally to auscultation without wheezes, rales, or rhonchi. Breathing is unlabored. Heart: Regular rhythm. No murmurs, rubs, or gallops. Msk:  Strength and tone normal for age. Extremities/Skin: Warm and dry.  Neuro: Alert and oriented X 3. Moves all extremities spontaneously. Gait is normal. CNII-XII grossly in tact. Psych:  Responds to questions appropriately with a normal affect.     ASSESSMENT AND PLAN:  66 y.o. year old male with  1. Bacterial respiratory infection He is to take antibiotic as directed. Follow-up if symptoms do not resolve within 1 week after completion of antibiotic. - azithromycin (ZITHROMAX) 250  MG tablet; Day 1: Take 2 daily. Days 2 -5: Take 1 daily.  Dispense: 6 tablet; Refill: 0   Signed, [address] [place], [place], [facility] 05/15/2016 4:27 PM

## 2016-06-04 ENCOUNTER — Ambulatory Visit: Payer: Self-pay | Admitting: General Surgery

## 2016-06-04 DIAGNOSIS — E210 Primary hyperparathyroidism: Secondary | ICD-10-CM | POA: Diagnosis not present

## 2016-06-04 NOTE — H&P (Signed)
STEPEHN Patterson 06/04/2016 9:22 AM Location: [facility] Patient #: [id number] DOB: Nov 08, 1950 Married / Language: English / Race: White Male  History of Present Illness Todd Hollingshead MD; 06/04/2016 12:20 PM) The patient is a 66 year old male.   Note:He is referred by Dr. Jeanann Lewandowsky for consultation regarding hyperparathyroidism and a nuclear medicine scan suggesting a parathyroid adenoma on the right side. He was noted to have hypercalcemia. Further workup demonstrated increased parathyroid hormone level (Calcium 11.1, intact PTH 108). 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  He does not have any history of kidney stones or specific overt symptoms of hyperparathyroidism. His past medical history is notable for a myocardial infarction which required cardiac catheterization and a stent placement in 2010. He saw Dr. Quay Burow here recently and was felt to be doing well from a heart standpoint.  Past Surgical History Todd Patterson, [place]; 06/04/2016 9:23 AM) No pertinent past surgical history  Diagnostic Studies History Todd Patterson, [place]; 06/04/2016 9:23 AM) Colonoscopy within last year  Allergies Todd Patterson, CMA; 06/04/2016 9:23 AM) No Known Drug Allergies 06/04/2016 Allergies Reconciled  Medication History Todd Patterson, CMA; 06/04/2016 9:24 AM) Carvedilol (6.25MG  Tablet, Oral daily) Active. Lisinopril (20MG  Tablet, Oral daily) Active. Simvastatin (40MG  Tablet, Oral daily) Active. Aspirin (81MG  Tablet, Oral daily) Active. Ibuprofen (200MG  Tablet, Oral as needed) Active. Medications Reconciled  Social History Todd Patterson, [place]; 06/04/2016 9:23 AM) Alcohol use Occasional alcohol use. Caffeine use  Coffee. No drug use Tobacco use Former smoker.  Family History Todd Patterson, [place]; 06/04/2016 9:23 AM) Breast Cancer Sister.  Other Problems Todd Patterson, [place]; 06/04/2016 9:23 AM) Chest pain Congestive Heart Failure High blood pressure Hypercholesterolemia Myocardial infarction     Review of Systems Todd Patterson CMA; 06/04/2016 9:23 AM) General Not Present- Appetite Loss, Chills, Fatigue, Fever, Night Sweats, Weight Gain and Weight Loss. Skin Not Present- Change in Wart/Mole, Dryness, Hives, Jaundice, New Lesions, Non-Healing Wounds, Rash and Ulcer. HEENT Not Present- Earache, Hearing Loss, Hoarseness, Nose Bleed, Oral Ulcers, Ringing in the Ears, Seasonal Allergies, Sinus Pain, Sore Throat, Visual Disturbances, Wears glasses/contact lenses and Yellow Eyes. Respiratory Present- Snoring. Not Present- Bloody sputum, Chronic Cough, Difficulty Breathing and Wheezing. Cardiovascular Not Present- Chest Pain, Difficulty Breathing Lying Down, Leg Cramps, Palpitations, Rapid Heart Rate, Shortness of Breath and Swelling of Extremities. Gastrointestinal Not Present- Abdominal Pain, Bloating, Bloody Stool, Change in Bowel Habits, Chronic diarrhea, Constipation, Difficulty Swallowing, Excessive gas, Gets full quickly at meals, Hemorrhoids, Indigestion, Nausea, Rectal Pain and Vomiting. Male Genitourinary Not Present- Blood in Urine, Change in Urinary Stream, Frequency, Impotence, Nocturia, Painful Urination, Urgency and Urine Leakage. Neurological Not Present- Decreased Memory, Fainting, Headaches, Numbness, Seizures, Tingling, Tremor, Trouble walking and Weakness. Psychiatric Not Present- Anxiety, Bipolar, Change in Sleep Pattern, Depression, Fearful and Frequent crying. Endocrine Not Present- Cold Intolerance, Excessive Hunger, Hair Changes, Heat Intolerance, Hot flashes and New Diabetes.  Vitals Bary Castilla Bradford CMA; 06/04/2016 9:25 AM) 06/04/2016 9:24 AM Weight: 197.2 lb Height:  70in Body Surface Area: 2.07 m Body Mass Index: 28.29 kg/m  Temp.: 97.45F  Pulse: 73 (Regular)  BP: 142/78 (Sitting, Left Arm, Standard)      Physical Exam Todd Hollingshead MD; 06/04/2016 10:03 AM)  The physical exam findings are as follows: Note:GENERAL APPEARANCE: Overweight male in NAD. Pleasant and cooperative.  EARS,  NOSE, MOUTH THROAT: [place]/AT external ears: no lesions or deformities external nose: no lesions or deformities hearing: grossly normal lips: moist, no deformities EYES external: conjunctiva, lids, sclerae normal pupils: equal, round   NECK: Supple, no obvious mass or thyroid mass/enlargement, no trachea deviation  CV ascultation: RRR, no murmur extremity edema: no extremity varicosities: no  RESP auscultation: breath sounds equal and clear respiratory effort: normal  GASTROINTESTINAL abdomen: Soft, non-tender, non-distended, no masses liver and spleen: not enlarged. hernia: none present scar: none present  MUSCULOSKELETAL station and gait: normal muscle strength: grossly normal all extremities deformities: none instability: none  LYMPHATIC: No palpable cervical, supraclavicular adenopathy.  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  Plan: I recommended parathyroidectomy. The procedure and risks have been discussed. Risks include but not limited to bleeding, infection, wound healing problems, anesthesia, injury to recurrent laryngeal nerve and permanent hoarseness, hypocalcemia, voice changes, dysphagia. He seems to understand and would like to proceed. I will send a note to Dr. Gwenlyn Found to see if he needs any preoperative cardiac  workup.  Jackolyn Confer, M.D.

## 2016-06-05 ENCOUNTER — Ambulatory Visit: Payer: Self-pay | Admitting: General Surgery

## 2016-06-05 ENCOUNTER — Telehealth: Payer: Self-pay | Admitting: Cardiovascular Disease

## 2016-06-05 NOTE — Telephone Encounter (Signed)
Requesting surgical clearance:  1. Type of surgery: Parathyroidectomy  2. Surgeon: Jackolyn Confer, MD  3.Surgical Date:  Pending clearance  4. Medications that need to be held: ASA 325   5. CAD: Yes  6. I will defer to:  Dr. Pearla Dubonnet Information:  [facility], [place] Phone:  [phone] Fax:  [phone]

## 2016-06-06 NOTE — Telephone Encounter (Signed)
Cleared at low risk for parathyroidectomy. OK to interrupt anti platelet Rx

## 2016-06-06 NOTE — Telephone Encounter (Signed)
Clearance routed to number provided via EPIC. 

## 2016-06-14 NOTE — Telephone Encounter (Signed)
Closed encounter °

## 2016-06-17 ENCOUNTER — Ambulatory Visit (AMBULATORY_SURGERY_CENTER): Payer: Self-pay

## 2016-06-17 VITALS — Ht 70.0 in | Wt 199.2 lb

## 2016-06-17 DIAGNOSIS — Z860100 Personal history of colon polyps, unspecified: Secondary | ICD-10-CM

## 2016-06-17 MED ORDER — SUPREP BOWEL PREP KIT 17.5-3.13-1.6 GM/177ML PO SOLN: 1.0000 | Freq: Once | ORAL | 0 refills | Status: AC

## 2016-06-17 NOTE — Progress Notes (Signed)
No allergies to eggs or soy No past problems with anesthesia No home oxygen No diet meds  Declined emmi 

## 2016-06-27 NOTE — Patient Instructions (Signed)
Todd Patterson  06/27/2016   Your procedure is scheduled on: 07/04/16  Report to [facility] Main  Entrance Take [place]  elevators to 3rd floor to  [place] at     0800 AM.    Call this number if you have problems the morning of surgery [phone]    Remember: ONLY 1 PERSON MAY GO WITH YOU TO SHORT STAY TO GET  READY MORNING OF YOUR SURGERY.  Do not eat food or drink liquids :After Midnight.     Take these medicines the morning of surgery with A SIP OF WATER: Coreg                                You may not have any metal on your body including hair pins and              piercings  Do not wear jewelry,  lotions, powders or perfumes, deodorant                       Men may shave face and neck.   Do not bring valuables to the hospital. [place].  Contacts, dentures or bridgework may not be worn into surgery.  Leave suitcase in the car. After surgery it may be brought to your room.                 Please read over the following fact sheets you were given: _____________________________________________________________________           [facility] - Preparing for Surgery Before surgery, you can play an important role.  Because skin is not sterile, your skin needs to be as free of germs as possible.  You can reduce the number of germs on your skin by washing with CHG (chlorahexidine gluconate) soap before surgery.  CHG is an antiseptic cleaner which kills germs and bonds with the skin to continue killing germs even after washing. Please DO NOT use if you have an allergy to CHG or antibacterial soaps.  If your skin becomes reddened/irritated stop using the CHG and inform your nurse when you arrive at Short Stay. Do not shave (including legs and underarms) for at least 48 hours prior to the first CHG shower.  You may shave your face/neck. Please follow these instructions carefully:  1.  Shower with CHG Soap  the night before surgery and the  morning of Surgery.  2.  If you choose to wash your hair, wash your hair first as usual with your  normal  shampoo.  3.  After you shampoo, rinse your hair and body thoroughly to remove the  shampoo.                           4.  Use CHG as you would any other liquid soap.  You can apply chg directly  to the skin and wash                       Gently with a scrungie or clean washcloth.  5.  Apply the CHG Soap to your body ONLY FROM THE NECK DOWN.   Do not use on face/  open                           Wound or open sores. Avoid contact with eyes, ears mouth and genitals (private parts).                       Wash face,  Genitals (private parts) with your normal soap.             6.  Wash thoroughly, paying special attention to the area where your surgery  will be performed.  7.  Thoroughly rinse your body with warm water from the neck down.  8.  DO NOT shower/wash with your normal soap after using and rinsing off  the CHG Soap.                9.  Pat yourself dry with a clean towel.            10.  Wear clean pajamas.            11.  Place clean sheets on your bed the night of your first shower and do not  sleep with pets. Day of Surgery : Do not apply any lotions/deodorants the morning of surgery.  Please wear clean clothes to the hospital/surgery center.  FAILURE TO FOLLOW THESE INSTRUCTIONS MAY RESULT IN THE CANCELLATION OF YOUR SURGERY PATIENT SIGNATURE_________________________________  NURSE SIGNATURE__________________________________  ________________________________________________________________________

## 2016-07-02 ENCOUNTER — Encounter (HOSPITAL_COMMUNITY): Payer: Self-pay

## 2016-07-02 ENCOUNTER — Encounter (HOSPITAL_COMMUNITY)
Admission: RE | Admit: 2016-07-02 | Discharge: 2016-07-02 | Disposition: A | Discharge status: Home / Self Care | Payer: Medicare HMO | Source: Ambulatory Visit | Attending: General Surgery | Admitting: General Surgery

## 2016-07-02 ENCOUNTER — Ambulatory Visit (HOSPITAL_COMMUNITY)
Admission: RE | Admit: 2016-07-02 | Discharge: 2016-07-02 | Disposition: A | Discharge status: Home / Self Care | Payer: Medicare HMO | Source: Ambulatory Visit | Attending: Anesthesiology | Admitting: Anesthesiology

## 2016-07-02 DIAGNOSIS — E210 Primary hyperparathyroidism: Secondary | ICD-10-CM | POA: Diagnosis not present

## 2016-07-02 DIAGNOSIS — I10 Essential (primary) hypertension: Secondary | ICD-10-CM | POA: Diagnosis not present

## 2016-07-02 DIAGNOSIS — Z01818 Encounter for other preprocedural examination: Secondary | ICD-10-CM

## 2016-07-02 DIAGNOSIS — R918 Other nonspecific abnormal finding of lung field: Secondary | ICD-10-CM | POA: Insufficient documentation

## 2016-07-02 DIAGNOSIS — Z01812 Encounter for preprocedural laboratory examination: Secondary | ICD-10-CM | POA: Insufficient documentation

## 2016-07-02 DIAGNOSIS — I700 Atherosclerosis of aorta: Secondary | ICD-10-CM | POA: Diagnosis not present

## 2016-07-02 HISTORY — DX: Thyrotoxicosis, unspecified without thyrotoxic crisis or storm: E05.90

## 2016-07-02 LAB — CBC WITH DIFFERENTIAL/PLATELET
Basophils Absolute: 0 10*3/uL (ref 0.0–0.1)
Basophils Relative: 0 %
Eosinophils Absolute: 0.4 10*3/uL (ref 0.0–0.7)
Eosinophils Relative: 4 %
HCT: 43.2 % (ref 39.0–52.0)
HEMOGLOBIN: 14.6 g/dL (ref 13.0–17.0)
LYMPHS ABS: 2.4 10*3/uL (ref 0.7–4.0)
LYMPHS PCT: 26 %
MCH: 30.1 pg (ref 26.0–34.0)
MCHC: 33.8 g/dL (ref 30.0–36.0)
MCV: 89.1 fL (ref 78.0–100.0)
MONO ABS: 1.3 10*3/uL — AB (ref 0.1–1.0)
MONOS PCT: 14 %
NEUTROS ABS: 5 10*3/uL (ref 1.7–7.7)
NEUTROS PCT: 56 %
Platelets: 240 10*3/uL (ref 150–400)
RBC: 4.85 MIL/uL (ref 4.22–5.81)
RDW: 13.1 % (ref 11.5–15.5)
WBC: 9.2 10*3/uL (ref 4.0–10.5)

## 2016-07-02 LAB — COMPREHENSIVE METABOLIC PANEL
ALBUMIN: 3.9 g/dL (ref 3.5–5.0)
ALK PHOS: 63 U/L (ref 38–126)
ALT: 20 U/L (ref 17–63)
ANION GAP: 6 (ref 5–15)
AST: 19 U/L (ref 15–41)
BILIRUBIN TOTAL: 0.7 mg/dL (ref 0.3–1.2)
BUN: 20 mg/dL (ref 6–20)
CALCIUM: 11.1 mg/dL — AB (ref 8.9–10.3)
CO2: 27 mmol/L (ref 22–32)
Chloride: 105 mmol/L (ref 101–111)
Creatinine, Ser: 0.68 mg/dL (ref 0.61–1.24)
GLUCOSE: 91 mg/dL (ref 65–99)
Potassium: 4.4 mmol/L (ref 3.5–5.1)
Sodium: 138 mmol/L (ref 135–145)
TOTAL PROTEIN: 7 g/dL (ref 6.5–8.1)

## 2016-07-02 NOTE — Progress Notes (Signed)
ekg 04/26/16 epic

## 2016-07-02 NOTE — Progress Notes (Signed)
CXR done 07/02/16 routed via epic to Dr. Zella Richer

## 2016-07-04 ENCOUNTER — Encounter (HOSPITAL_COMMUNITY)
Admission: RE | Disposition: A | Discharge status: Home / Self Care | Payer: Self-pay | Source: Ambulatory Visit | Attending: General Surgery

## 2016-07-04 ENCOUNTER — Encounter (HOSPITAL_COMMUNITY): Payer: Self-pay | Admitting: *Deleted

## 2016-07-04 ENCOUNTER — Ambulatory Visit (HOSPITAL_COMMUNITY)
Admission: RE | Admit: 2016-07-04 | Discharge: 2016-07-05 | Disposition: A | Discharge status: Home / Self Care | Payer: Medicare HMO | Source: Ambulatory Visit | Attending: General Surgery | Admitting: General Surgery

## 2016-07-04 ENCOUNTER — Ambulatory Visit (HOSPITAL_COMMUNITY): Payer: Medicare HMO | Admitting: Certified Registered Nurse Anesthetist

## 2016-07-04 ENCOUNTER — Encounter: Payer: Medicare HMO | Admitting: Gastroenterology

## 2016-07-04 DIAGNOSIS — Z87891 Personal history of nicotine dependence: Secondary | ICD-10-CM | POA: Diagnosis not present

## 2016-07-04 DIAGNOSIS — I10 Essential (primary) hypertension: Secondary | ICD-10-CM | POA: Diagnosis not present

## 2016-07-04 DIAGNOSIS — E785 Hyperlipidemia, unspecified: Secondary | ICD-10-CM | POA: Diagnosis not present

## 2016-07-04 DIAGNOSIS — E7800 Pure hypercholesterolemia, unspecified: Secondary | ICD-10-CM | POA: Insufficient documentation

## 2016-07-04 DIAGNOSIS — I252 Old myocardial infarction: Secondary | ICD-10-CM | POA: Insufficient documentation

## 2016-07-04 DIAGNOSIS — Z79899 Other long term (current) drug therapy: Secondary | ICD-10-CM | POA: Diagnosis not present

## 2016-07-04 DIAGNOSIS — E210 Primary hyperparathyroidism: Secondary | ICD-10-CM | POA: Diagnosis not present

## 2016-07-04 DIAGNOSIS — D351 Benign neoplasm of parathyroid gland: Secondary | ICD-10-CM | POA: Diagnosis not present

## 2016-07-04 DIAGNOSIS — Z955 Presence of coronary angioplasty implant and graft: Secondary | ICD-10-CM | POA: Diagnosis not present

## 2016-07-04 DIAGNOSIS — I509 Heart failure, unspecified: Secondary | ICD-10-CM | POA: Diagnosis not present

## 2016-07-04 DIAGNOSIS — Z7982 Long term (current) use of aspirin: Secondary | ICD-10-CM | POA: Insufficient documentation

## 2016-07-04 DIAGNOSIS — I2510 Atherosclerotic heart disease of native coronary artery without angina pectoris: Secondary | ICD-10-CM | POA: Insufficient documentation

## 2016-07-04 DIAGNOSIS — I110 Hypertensive heart disease with heart failure: Secondary | ICD-10-CM | POA: Insufficient documentation

## 2016-07-04 HISTORY — PX: PARATHYROIDECTOMY: SHX19

## 2016-07-04 SURGERY — PARATHYROIDECTOMY
Anesthesia: General

## 2016-07-04 MED ORDER — CEFAZOLIN SODIUM-DEXTROSE 2-4 GM/100ML-% IV SOLN
2.0000 g | INTRAVENOUS | Status: AC
  Administered 2016-07-04: 2 g via INTRAVENOUS
  Filled 2016-07-04: qty 100

## 2016-07-04 MED ORDER — SCOPOLAMINE 1 MG/3DAYS TD PT72
MEDICATED_PATCH | TRANSDERMAL | Status: AC
  Administered 2016-07-04: 1.5 mg via TRANSDERMAL
  Filled 2016-07-04: qty 1

## 2016-07-04 MED ORDER — LIDOCAINE 2% (20 MG/ML) 5 ML SYRINGE
INTRAMUSCULAR | Status: AC
  Filled 2016-07-04: qty 5

## 2016-07-04 MED ORDER — CARVEDILOL 6.25 MG PO TABS
6.2500 mg | ORAL_TABLET | Freq: Two times a day (BID) | ORAL | Status: AC
  Administered 2016-07-04: 6.25 mg via ORAL
  Filled 2016-07-04: qty 1

## 2016-07-04 MED ORDER — CHLORHEXIDINE GLUCONATE CLOTH 2 % EX PADS: 6.0000 | MEDICATED_PAD | Freq: Once | CUTANEOUS | Status: DC

## 2016-07-04 MED ORDER — MIDAZOLAM HCL 2 MG/2ML IJ SOLN
INTRAMUSCULAR | Status: AC
  Filled 2016-07-04: qty 2

## 2016-07-04 MED ORDER — BUPIVACAINE HCL (PF) 0.25 % IJ SOLN
INTRAMUSCULAR | Status: AC
  Filled 2016-07-04: qty 30

## 2016-07-04 MED ORDER — ONDANSETRON HCL 4 MG/2ML IJ SOLN: 4.0000 mg | INTRAMUSCULAR | Status: DC | PRN

## 2016-07-04 MED ORDER — MORPHINE SULFATE (PF) 10 MG/ML IV SOLN
2.0000 mg | INTRAVENOUS | Status: DC | PRN
  Administered 2016-07-04: 3 mg via INTRAVENOUS
  Filled 2016-07-04: qty 1

## 2016-07-04 MED ORDER — ONDANSETRON HCL 4 MG/2ML IJ SOLN
INTRAMUSCULAR | Status: DC | PRN
  Administered 2016-07-04: 4 mg via INTRAVENOUS

## 2016-07-04 MED ORDER — MIDAZOLAM HCL 5 MG/5ML IJ SOLN
INTRAMUSCULAR | Status: DC | PRN
  Administered 2016-07-04: 2 mg via INTRAVENOUS

## 2016-07-04 MED ORDER — FENTANYL CITRATE (PF) 100 MCG/2ML IJ SOLN
INTRAMUSCULAR | Status: AC
  Filled 2016-07-04: qty 2

## 2016-07-04 MED ORDER — SCOPOLAMINE 1 MG/3DAYS TD PT72
1.0000 | MEDICATED_PATCH | TRANSDERMAL | Status: DC
  Administered 2016-07-04: 1.5 mg via TRANSDERMAL

## 2016-07-04 MED ORDER — LISINOPRIL 20 MG PO TABS
20.0000 mg | ORAL_TABLET | Freq: Every day | ORAL | Status: DC
  Administered 2016-07-04 – 2016-07-05 (×2): 20 mg via ORAL
  Filled 2016-07-04 (×2): qty 1

## 2016-07-04 MED ORDER — OXYCODONE HCL 5 MG PO TABS
5.0000 mg | ORAL_TABLET | ORAL | Status: DC | PRN
  Administered 2016-07-04 – 2016-07-05 (×4): 10 mg via ORAL
  Filled 2016-07-04 (×4): qty 2

## 2016-07-04 MED ORDER — DEXAMETHASONE SODIUM PHOSPHATE 10 MG/ML IJ SOLN
INTRAMUSCULAR | Status: AC
  Filled 2016-07-04: qty 1

## 2016-07-04 MED ORDER — HYDROMORPHONE HCL 2 MG/ML IJ SOLN
0.2500 mg | INTRAMUSCULAR | Status: DC | PRN
  Administered 2016-07-04 (×4): 0.5 mg via INTRAVENOUS

## 2016-07-04 MED ORDER — HYDROMORPHONE HCL 2 MG/ML IJ SOLN
INTRAMUSCULAR | Status: AC
  Administered 2016-07-04: 0.5 mg via INTRAVENOUS
  Filled 2016-07-04: qty 1

## 2016-07-04 MED ORDER — ONDANSETRON 4 MG PO TBDP: 4.0000 mg | ORAL_TABLET | Freq: Four times a day (QID) | ORAL | Status: DC | PRN

## 2016-07-04 MED ORDER — EPHEDRINE 5 MG/ML INJ
INTRAVENOUS | Status: AC
  Filled 2016-07-04: qty 10

## 2016-07-04 MED ORDER — LISINOPRIL 20 MG PO TABS: 20.0000 mg | ORAL_TABLET | Freq: Every day | ORAL | Status: DC

## 2016-07-04 MED ORDER — LACTATED RINGERS IV SOLN
INTRAVENOUS | Status: DC
  Administered 2016-07-04: 08:00:00 via INTRAVENOUS

## 2016-07-04 MED ORDER — ONDANSETRON HCL 4 MG/2ML IJ SOLN
INTRAMUSCULAR | Status: AC
  Filled 2016-07-04: qty 2

## 2016-07-04 MED ORDER — KCL IN DEXTROSE-NACL 20-5-0.9 MEQ/L-%-% IV SOLN
INTRAVENOUS | Status: DC
  Administered 2016-07-04 – 2016-07-05 (×2): via INTRAVENOUS
  Filled 2016-07-04 (×2): qty 1000

## 2016-07-04 MED ORDER — HYDROMORPHONE HCL 2 MG/ML IJ SOLN: 0.2500 mg | INTRAMUSCULAR | Status: DC | PRN

## 2016-07-04 MED ORDER — PROPOFOL 10 MG/ML IV BOLUS
INTRAVENOUS | Status: AC
  Filled 2016-07-04: qty 20

## 2016-07-04 MED ORDER — CARVEDILOL 6.25 MG PO TABS
6.2500 mg | ORAL_TABLET | Freq: Two times a day (BID) | ORAL | Status: DC
  Administered 2016-07-04 – 2016-07-05 (×2): 6.25 mg via ORAL
  Filled 2016-07-04 (×2): qty 1

## 2016-07-04 MED ORDER — ROCURONIUM BROMIDE 50 MG/5ML IV SOSY
PREFILLED_SYRINGE | INTRAVENOUS | Status: AC
  Filled 2016-07-04: qty 5

## 2016-07-04 MED ORDER — FENTANYL CITRATE (PF) 100 MCG/2ML IJ SOLN
INTRAMUSCULAR | Status: DC | PRN
  Administered 2016-07-04 (×4): 50 ug via INTRAVENOUS

## 2016-07-04 MED ORDER — EPHEDRINE SULFATE-NACL 50-0.9 MG/10ML-% IV SOSY
PREFILLED_SYRINGE | INTRAVENOUS | Status: DC | PRN
  Administered 2016-07-04 (×2): 5 mg via INTRAVENOUS

## 2016-07-04 MED ORDER — SUGAMMADEX SODIUM 200 MG/2ML IV SOLN
INTRAVENOUS | Status: AC
  Filled 2016-07-04: qty 2

## 2016-07-04 MED ORDER — SUGAMMADEX SODIUM 200 MG/2ML IV SOLN
INTRAVENOUS | Status: DC | PRN
  Administered 2016-07-04: 200 mg via INTRAVENOUS

## 2016-07-04 MED ORDER — PROMETHAZINE HCL 25 MG/ML IJ SOLN: 6.2500 mg | INTRAMUSCULAR | Status: DC | PRN

## 2016-07-04 MED ORDER — LIDOCAINE 2% (20 MG/ML) 5 ML SYRINGE
INTRAMUSCULAR | Status: DC | PRN
  Administered 2016-07-04: 100 mg via INTRAVENOUS

## 2016-07-04 MED ORDER — PROPOFOL 10 MG/ML IV BOLUS
INTRAVENOUS | Status: DC | PRN
  Administered 2016-07-04: 180 mg via INTRAVENOUS

## 2016-07-04 MED ORDER — ROCURONIUM BROMIDE 10 MG/ML (PF) SYRINGE
PREFILLED_SYRINGE | INTRAVENOUS | Status: DC | PRN
  Administered 2016-07-04 (×2): 10 mg via INTRAVENOUS
  Administered 2016-07-04: 50 mg via INTRAVENOUS

## 2016-07-04 MED ORDER — DEXAMETHASONE SODIUM PHOSPHATE 10 MG/ML IJ SOLN
INTRAMUSCULAR | Status: DC | PRN
  Administered 2016-07-04: 10 mg via INTRAVENOUS

## 2016-07-04 SURGICAL SUPPLY — 41 items
ATTRACTOMAT 16X20 MAGNETIC DRP (DRAPES) ×2 IMPLANT
BENZOIN TINCTURE PRP APPL 2/3 (GAUZE/BANDAGES/DRESSINGS) ×2 IMPLANT
BLADE HEX COATED 2.75 (ELECTRODE) ×2 IMPLANT
BLADE SURG 15 STRL LF DISP TIS (BLADE) ×2 IMPLANT
BLADE SURG 15 STRL SS (BLADE) ×2
CHLORAPREP W/TINT 26ML (MISCELLANEOUS) ×2 IMPLANT
CLIP TI MEDIUM 6 (CLIP) ×8 IMPLANT
CLIP TI WIDE RED SMALL 6 (CLIP) ×8 IMPLANT
DISSECTOR ROUND CHERRY 3/8 STR (MISCELLANEOUS) ×2 IMPLANT
DRAIN PENROSE 18X1/4 LTX STRL (WOUND CARE) IMPLANT
DRAPE LAPAROTOMY T 98X78 PEDS (DRAPES) ×2 IMPLANT
ELECT PENCIL ROCKER SW 15FT (MISCELLANEOUS) ×2 IMPLANT
ELECT REM PT RETURN 15FT ADLT (MISCELLANEOUS) ×2 IMPLANT
GAUZE SPONGE 4X4 12PLY STRL (GAUZE/BANDAGES/DRESSINGS) ×2 IMPLANT
GAUZE SPONGE 4X4 16PLY XRAY LF (GAUZE/BANDAGES/DRESSINGS) ×4 IMPLANT
GLOVE BIOGEL PI IND STRL 7.0 (GLOVE) ×1 IMPLANT
GLOVE BIOGEL PI INDICATOR 7.0 (GLOVE) ×1
GLOVE ECLIPSE 8.0 STRL XLNG CF (GLOVE) ×2 IMPLANT
GLOVE INDICATOR 8.0 STRL GRN (GLOVE) ×4 IMPLANT
GOWN STRL REUS W/TWL LRG LVL3 (GOWN DISPOSABLE) ×2 IMPLANT
GOWN STRL REUS W/TWL XL LVL3 (GOWN DISPOSABLE) ×4 IMPLANT
HEMOSTAT SURGICEL 2X14 (HEMOSTASIS) ×2 IMPLANT
ILLUMINATOR WAVEGUIDE N/F (MISCELLANEOUS) ×2 IMPLANT
KIT BASIN OR (CUSTOM PROCEDURE TRAY) ×2 IMPLANT
NS IRRIG 1000ML POUR BTL (IV SOLUTION) ×2 IMPLANT
PACK BASIC VI WITH GOWN DISP (CUSTOM PROCEDURE TRAY) ×2 IMPLANT
SHEARS HARMONIC 9CM CVD (BLADE) IMPLANT
STAPLER VISISTAT 35W (STAPLE) ×2 IMPLANT
STRIP CLOSURE SKIN 1/2X4 (GAUZE/BANDAGES/DRESSINGS) ×2 IMPLANT
SUT MNCRL AB 4-0 PS2 18 (SUTURE) ×2 IMPLANT
SUT SILK 2 0 (SUTURE) ×1
SUT SILK 2-0 18XBRD TIE 12 (SUTURE) ×1 IMPLANT
SUT SILK 3 0 (SUTURE) ×1
SUT SILK 3-0 18XBRD TIE 12 (SUTURE) ×1 IMPLANT
SUT VIC AB 3-0 SH 18 (SUTURE) ×4 IMPLANT
SUT VICRYL 3 0 BR 18  UND (SUTURE)
SUT VICRYL 3 0 BR 18 UND (SUTURE) IMPLANT
SYR BULB IRRIGATION 50ML (SYRINGE) ×2 IMPLANT
TOWEL OR 17X26 10 PK STRL BLUE (TOWEL DISPOSABLE) ×2 IMPLANT
TOWEL OR NON WOVEN STRL DISP B (DISPOSABLE) ×2 IMPLANT
YANKAUER SUCT BULB TIP 10FT TU (MISCELLANEOUS) ×2 IMPLANT

## 2016-07-04 NOTE — Anesthesia Preprocedure Evaluation (Addendum)
Anesthesia Evaluation  Patient identified by MRN, date of birth, ID band Patient awake    Reviewed: Allergy & Precautions, NPO status , Patient's Chart, lab work & pertinent test results  History of Anesthesia Complications Negative for: history of anesthetic complications  Airway Mallampati: II  TM Distance: >3 FB Neck ROM: Full    Dental no notable dental hx. (+) Dental Advisory Given   Pulmonary former smoker,    Pulmonary exam normal        Cardiovascular hypertension, + CAD, + Past MI and + Cardiac Stents  Normal cardiovascular exam     Neuro/Psych negative neurological ROS  negative psych ROS   GI/Hepatic negative GI ROS, Neg liver ROS,   Endo/Other  Hyperthyroidism   Renal/GU negative Renal ROS     Musculoskeletal   Abdominal   Peds  Hematology   Anesthesia Other Findings   Reproductive/Obstetrics                             Anesthesia Physical Anesthesia Plan  ASA: III  Anesthesia Plan: General   Post-op Pain Management:    Induction: Intravenous  Airway Management Planned: Oral ETT  Additional Equipment:   Intra-op Plan:   Post-operative Plan: Extubation in OR  Informed Consent: I have reviewed the patients History and Physical, chart, labs and discussed the procedure including the risks, benefits and alternatives for the proposed anesthesia with the patient or authorized representative who has indicated his/her understanding and acceptance.   Dental advisory given  Plan Discussed with: Anesthesiologist  Anesthesia Plan Comments: (Did not take beta blocker, will need metoprolol intra-op.)       Anesthesia Quick Evaluation

## 2016-07-04 NOTE — Transfer of Care (Signed)
Immediate Anesthesia Transfer of Care Note  Patient: Todd Patterson  Procedure(s) Performed: Procedure(s): PARATHYROIDECTOMY (N/A)  Patient Location: PACU  Anesthesia Type:General  Level of Consciousness:  sedated, patient cooperative and responds to stimulation  Airway & Oxygen Therapy:Patient Spontanous Breathing and Patient connected to face mask oxgen  Post-op Assessment:  Report given to PACU RN and Post -op Vital signs reviewed and stable  Post vital signs:  Reviewed and stable  Last Vitals:  Vitals:   07/04/16 0752  BP: (!) 161/77  Pulse: 68  Resp: 18  Temp: 57.9 C    Complications: No apparent anesthesia complications

## 2016-07-04 NOTE — Anesthesia Postprocedure Evaluation (Addendum)
Anesthesia Post Note  Patient: Todd Patterson  Procedure(s) Performed: Procedure(s) (LRB): PARATHYROIDECTOMY (N/A)  Patient location during evaluation: PACU Anesthesia Type: General Level of consciousness: sedated Pain management: pain level controlled Vital Signs Assessment: post-procedure vital signs reviewed and stable Respiratory status: spontaneous breathing and respiratory function stable Cardiovascular status: stable Anesthetic complications: no       Last Vitals:  Vitals:   07/04/16 1400 07/04/16 1406  BP: (!) 174/84 (!) 175/84  Pulse: 62 65  Resp: 12 14  Temp: 36.6 C 36.4 C    Last Pain:  Vitals:   07/04/16 1400  TempSrc:   PainSc: 3                  Marlane Hirschmann DANIEL

## 2016-07-04 NOTE — Op Note (Signed)
OPERATIVE REPORT - PARATHYROIDECTOMY  Preoperative diagnosis: Primary hyperparathyroidism  Postop diagnosis: Same  Procedure: Right neck exploration and right superior parathyroidectomy  Surgeon:  Jackolyn Confer, M.D.  Asst:  Armandina Gemma M.D.  Anesthesia: General  Estimated blood loss: Less than 100 cc  Indications: This is a 66 year old male with hypercalcemia and elevated parathyroid hormone level. Sestamibi scan demonstrated area of enhancement in the right inferior thyroid lobe area. Findings are all consistent with primary hyperparathyroidism. He now presents for parathyroidectomy.  Procedure: He was seen in the holding area. He was brought to operating room and placed in a supine position on the operating room table. Following administration of general anesthesia, the patient was positioned and then prepped and draped in the usual strict aseptic fashion. A timeout was performed.  After ascertaining that an adequate level of anesthesia been achieved, a small right neck incision was made sharply. Dissection was carried through subcutaneous tissues and platysma. Hemostasis was obtained with the electrocautery. Skin flaps were developed circumferentially and a Weitlander retractor was placed for exposure.  Strap muscles were incised in the midline. Strap muscles were reflected exposing the right thyroid lobe. With gentle blunt dissection the inferior and medial aspect of the thyroid lobe was mobilized.  A sinus scan, this appeared to be near the inferior aspect of the right thyroid lobe. However in looking in that area we did not identify an obvious enlarged parathyroid gland. I extended the incision a little bit toward the left for better exposure. The superior pole was partially mobilized and the medial aspect of the right lobe was completely mobilized. The recurrent laryngeal nerve was identified. Immediately deep to the recurrent laryngeal nerve wasn't fullness. Gentle dissection was  performed and an enlarged right superior parathyroid gland was identified. We continued to use blunt dissection and gently retracted the recurrent laryngeal nerve medially in order to be able to mobilize the enlarged parathyroid gland. The vascular supply was coming superiorly and. These vessels were isolated close to the gland. They were clipped and divided. The gland was then handed off the field and sent to pathology. Frozen section was consistent with parathyroid tissue and highly suspicious for parathyroid adenoma. The recurrent laryngeal nerve was inspected and was intact.  The neck was irrigated with warm saline and good hemostasis was noted. Fibrillar was placed in the operative field. Strap muscles were reapproximated in the midline with interrupted 3-0 Vicryl sutures. Platysma was closed with interrupted 3-0 Vicryl sutures. Skin was closed with a running 4-0 Monocryl subcuticular suture. The wound was washed and dried and benzoin and Steri-Strips were applied. Sterile gauze dressing was applied. He tolerated the procedure well without any apparent complications and was taken to the PACU in satisfactory condition.  Jackolyn Confer, M.D.

## 2016-07-04 NOTE — Interval H&P Note (Signed)
History and Physical Interval Note:  07/04/2016 9:47 AM  Todd Patterson  has presented today for surgery, with the diagnosis of Primary hyperparathyroidism  The various methods of treatment have been discussed with the patient and family. After consideration of risks, benefits and other options for treatment, the patient has consented to  Procedure(s): PARATHYROIDECTOMY (N/A) as a surgical intervention .  The patient's history has been reviewed, patient examined, no change in status, stable for surgery.  I have reviewed the patient's chart and labs.  Questions were answered to the patient's satisfaction.     Patricio Popwell Lenna Sciara

## 2016-07-04 NOTE — Discharge Instructions (Signed)
[facility], [place] [phone]  THYROID/ PARATHYROID SURGERY: POST OP INSTRUCTIONS  Always review your discharge instruction sheet given to you by the facility where your surgery was performed.  IF YOU HAVE DISABILITY OR FAMILY LEAVE FORMS, YOU MUST BRING THEM TO THE OFFICE FOR PROCESSING.  PLEASE DO NOT GIVE THEM TO YOUR DOCTOR.  1. A prescription for pain medication may be given to you upon discharge.  Take your pain medication as prescribed, if needed.  If narcotic pain medicine is not needed, then you may take acetaminophen (Tylenol) or ibuprofen (Advil) as needed. 2. Take your usually prescribed medications unless otherwise directed. 3. If you need a refill on your pain medication, please contact your pharmacy. They will contact our office to request authorization.  Prescriptions will not be filled after 5pm or on week-ends. 4. You should follow a light diet the first 24 hours after arrival home, such as soup and crackers, etc.  Be sure to include lots of fluids daily.  Resume your normal diet the day after surgery. 5. Most patients will experience some swelling and bruising on the chest and neck area.  Ice packs will help.  Swelling and bruising can take several days to resolve.  6. It is common to experience some constipation if taking pain medication after surgery.  Increasing fluid intake and taking a stool softener will usually help or prevent this problem from occurring.  A mild laxative (Milk of Magnesia or Miralax) should be taken according to package directions if there are no bowel movements after 48 hours. 7. Unless discharge instructions indicate otherwise, you may remove your bandages 48 hours after surgery, and you may shower at that time.  You may have steri-strips (small skin tapes) in place directly over the incision.  These strips should be left on the skin.  If your surgeon used skin glue on the incision, you may shower in 24 hours.  The glue will flake off  over the next 2-3 weeks.  Any sutures or staples will be removed at the office during your follow-up visit. 8. ACTIVITIES:  You may resume regular (light) daily activities beginning the next day--such as daily self-care, walking, climbing stairs--gradually increasing activities as tolerated.  You may have sexual intercourse when it is comfortable.  Refrain from any heavy lifting or straining for one week. a. You may drive when you no longer are taking prescription pain medication, you can comfortably wear a seatbelt, and you can safely maneuver your car and apply brakes b. RETURN TO WORK:  __________________________________________________________ 9. You should see your doctor in the office for a follow-up appointment approximately two weeks after your surgery.  Make sure that you call for this appointment within a day or two after you arrive home to insure a convenient appointment time. 10. OTHER INSTRUCTIONS: ____________________________________________________________________________ _________________________________________________________________________________________________________________ _________________________________________________________________________________________________________________   WHEN TO CALL YOUR DOCTOR: 1. Fever over 101.0 2. Inability to urinate 3. Nausea and/or vomiting 4. Extreme swelling or bruising 5. Continued bleeding from incision. 6. Increased pain, redness, or drainage from the incision. 7. Difficulty swallowing or breathing 8. Muscle cramping or spasms. 9. Numbness or tingling in hands or feet or around lips.  The clinic staff is available to answer your questions during regular business hours.  Please dont hesitate to call and ask to speak to one of the nurses if you have concerns.  For further questions, please visit [email or web address]

## 2016-07-04 NOTE — H&P (View-Only) (Signed)
Todd Patterson 06/04/2016 9:22 AM Location: [facility] Patient #: [id number] DOB: 1950/06/12 Married / Language: English / Race: White Male  History of Present Illness Todd Hollingshead MD; 06/04/2016 12:20 PM) The patient is a 66 year old male.   Note:He is referred by Dr. Jeanann Lewandowsky for consultation regarding hyperparathyroidism and a nuclear medicine scan suggesting a parathyroid adenoma on the right side. He was noted to have hypercalcemia. Further workup demonstrated increased parathyroid hormone level (Calcium 11.1, intact PTH 108). 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  He does not have any history of kidney stones or specific overt symptoms of hyperparathyroidism. His past medical history is notable for a myocardial infarction which required cardiac catheterization and a stent placement in 2010. He saw Dr. Quay Burow here recently and was felt to be doing well from a heart standpoint.  Past Surgical History Nance Pear, [place]; 06/04/2016 9:23 AM) No pertinent past surgical history  Diagnostic Studies History Nance Pear, [place]; 06/04/2016 9:23 AM) Colonoscopy within last year  Allergies Nance Pear, CMA; 06/04/2016 9:23 AM) No Known Drug Allergies 06/04/2016 Allergies Reconciled  Medication History Nance Pear, CMA; 06/04/2016 9:24 AM) Carvedilol (6.25MG  Tablet, Oral daily) Active. Lisinopril (20MG  Tablet, Oral daily) Active. Simvastatin (40MG  Tablet, Oral daily) Active. Aspirin (81MG  Tablet, Oral daily) Active. Ibuprofen (200MG  Tablet, Oral as needed) Active. Medications Reconciled  Social History Nance Pear, [place]; 06/04/2016 9:23 AM) Alcohol use Occasional alcohol use. Caffeine use  Coffee. No drug use Tobacco use Former smoker.  Family History Nance Pear, [place]; 06/04/2016 9:23 AM) Breast Cancer Sister.  Other Problems Nance Pear, [place]; 06/04/2016 9:23 AM) Chest pain Congestive Heart Failure High blood pressure Hypercholesterolemia Myocardial infarction     Review of Systems Nance Pear CMA; 06/04/2016 9:23 AM) General Not Present- Appetite Loss, Chills, Fatigue, Fever, Night Sweats, Weight Gain and Weight Loss. Skin Not Present- Change in Wart/Mole, Dryness, Hives, Jaundice, New Lesions, Non-Healing Wounds, Rash and Ulcer. HEENT Not Present- Earache, Hearing Loss, Hoarseness, Nose Bleed, Oral Ulcers, Ringing in the Ears, Seasonal Allergies, Sinus Pain, Sore Throat, Visual Disturbances, Wears glasses/contact lenses and Yellow Eyes. Respiratory Present- Snoring. Not Present- Bloody sputum, Chronic Cough, Difficulty Breathing and Wheezing. Cardiovascular Not Present- Chest Pain, Difficulty Breathing Lying Down, Leg Cramps, Palpitations, Rapid Heart Rate, Shortness of Breath and Swelling of Extremities. Gastrointestinal Not Present- Abdominal Pain, Bloating, Bloody Stool, Change in Bowel Habits, Chronic diarrhea, Constipation, Difficulty Swallowing, Excessive gas, Gets full quickly at meals, Hemorrhoids, Indigestion, Nausea, Rectal Pain and Vomiting. Male Genitourinary Not Present- Blood in Urine, Change in Urinary Stream, Frequency, Impotence, Nocturia, Painful Urination, Urgency and Urine Leakage. Neurological Not Present- Decreased Memory, Fainting, Headaches, Numbness, Seizures, Tingling, Tremor, Trouble walking and Weakness. Psychiatric Not Present- Anxiety, Bipolar, Change in Sleep Pattern, Depression, Fearful and Frequent crying. Endocrine Not Present- Cold Intolerance, Excessive Hunger, Hair Changes, Heat Intolerance, Hot flashes and New Diabetes.  Vitals Bary Castilla Bradford CMA; 06/04/2016 9:25 AM) 06/04/2016 9:24 AM Weight: 197.2 lb Height:  70in Body Surface Area: 2.07 m Body Mass Index: 28.29 kg/m  Temp.: 97.63F  Pulse: 73 (Regular)  BP: 142/78 (Sitting, Left Arm, Standard)      Physical Exam Todd Hollingshead MD; 06/04/2016 10:03 AM)  The physical exam findings are as follows: Note:GENERAL APPEARANCE: Overweight male in NAD. Pleasant and cooperative.  EARS,  NOSE, MOUTH THROAT: [place]/AT external ears: no lesions or deformities external nose: no lesions or deformities hearing: grossly normal lips: moist, no deformities EYES external: conjunctiva, lids, sclerae normal pupils: equal, round   NECK: Supple, no obvious mass or thyroid mass/enlargement, no trachea deviation  CV ascultation: RRR, no murmur extremity edema: no extremity varicosities: no  RESP auscultation: breath sounds equal and clear respiratory effort: normal  GASTROINTESTINAL abdomen: Soft, non-tender, non-distended, no masses liver and spleen: not enlarged. hernia: none present scar: none present  MUSCULOSKELETAL station and gait: normal muscle strength: grossly normal all extremities deformities: none instability: none  LYMPHATIC: No palpable cervical, supraclavicular adenopathy.  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  Plan: I recommended parathyroidectomy. The procedure and risks have been discussed. Risks include but not limited to bleeding, infection, wound healing problems, anesthesia, injury to recurrent laryngeal nerve and permanent hoarseness, hypocalcemia, voice changes, dysphagia. He seems to understand and would like to proceed. I will send a note to Dr. Gwenlyn Found to see if he needs any preoperative cardiac  workup.  Jackolyn Confer, M.D.

## 2016-07-04 NOTE — Anesthesia Procedure Notes (Signed)
Procedure Name: Intubation Date/Time: 07/04/2016 11:16 AM Performed by: Carleene Cooper A Pre-anesthesia Checklist: Patient identified, Emergency Drugs available, Suction available, Patient being monitored and Timeout performed Patient Re-evaluated:Patient Re-evaluated prior to inductionOxygen Delivery Method: Circle system utilized Preoxygenation: Pre-oxygenation with 100% oxygen Intubation Type: IV induction Ventilation: Mask ventilation without difficulty Laryngoscope Size: Miller and 2 Grade View: Grade I Tube type: Oral Tube size: 7.5 mm Number of attempts: 2 Airway Equipment and Method: Stylet Placement Confirmation: ETT inserted through vocal cords under direct vision,  positive ETCO2 and breath sounds checked- equal and bilateral Secured at: 22 cm Tube secured with: Tape Dental Injury: Teeth and Oropharynx as per pre-operative assessment  Comments: DL x 1 by CRNA with MAC 4 blade. Grade 1-2 view. - ETCO2. ETT removed. Masked. VSS. DL x 1 by Dr. Tobias Alexander with Mil 2 blade. Grade 1 view. ATOI. +ETCO2. BBS=

## 2016-07-05 DIAGNOSIS — E210 Primary hyperparathyroidism: Secondary | ICD-10-CM | POA: Diagnosis not present

## 2016-07-05 DIAGNOSIS — Z87891 Personal history of nicotine dependence: Secondary | ICD-10-CM | POA: Diagnosis not present

## 2016-07-05 DIAGNOSIS — Z7982 Long term (current) use of aspirin: Secondary | ICD-10-CM | POA: Diagnosis not present

## 2016-07-05 DIAGNOSIS — I252 Old myocardial infarction: Secondary | ICD-10-CM | POA: Diagnosis not present

## 2016-07-05 DIAGNOSIS — E7800 Pure hypercholesterolemia, unspecified: Secondary | ICD-10-CM | POA: Diagnosis not present

## 2016-07-05 DIAGNOSIS — I110 Hypertensive heart disease with heart failure: Secondary | ICD-10-CM | POA: Diagnosis not present

## 2016-07-05 DIAGNOSIS — I509 Heart failure, unspecified: Secondary | ICD-10-CM | POA: Diagnosis not present

## 2016-07-05 DIAGNOSIS — I2510 Atherosclerotic heart disease of native coronary artery without angina pectoris: Secondary | ICD-10-CM | POA: Diagnosis not present

## 2016-07-05 DIAGNOSIS — Z955 Presence of coronary angioplasty implant and graft: Secondary | ICD-10-CM | POA: Diagnosis not present

## 2016-07-05 DIAGNOSIS — Z79899 Other long term (current) drug therapy: Secondary | ICD-10-CM | POA: Diagnosis not present

## 2016-07-05 LAB — BASIC METABOLIC PANEL
ANION GAP: 9 (ref 5–15)
BUN: 17 mg/dL (ref 6–20)
CHLORIDE: 103 mmol/L (ref 101–111)
CO2: 28 mmol/L (ref 22–32)
Calcium: 9.5 mg/dL (ref 8.9–10.3)
Creatinine, Ser: 0.72 mg/dL (ref 0.61–1.24)
GFR calc Af Amer: >60 mL/min (ref >60)
GFR calc non Af Amer: >60 mL/min (ref >60)
Glucose, Bld: 136 mg/dL — ABNORMAL HIGH (ref 65–99)
POTASSIUM: 4.2 mmol/L (ref 3.5–5.1)
SODIUM: 140 mmol/L (ref 135–145)

## 2016-07-05 MED ORDER — OXYCODONE HCL 5 MG PO TABS: 5.0000 mg | ORAL_TABLET | ORAL | 0 refills | Status: DC | PRN

## 2016-07-05 NOTE — Progress Notes (Signed)
Assessment Active Problems:   Primary hyperparathyroidism ([place]) s/p right superior parathyroidectomy- Calcium down to 9.5.   Plan:  Discharge today.  Instructions given to him.   LOS: 0 days     1 Day Post-Op  Chief Complaint/Subjective: Some neck soreness.  Walking halls.  Wife in room.  Objective: Vital signs in last 24 hours: Temp:  [97.6 F (36.4 C)-97.9 F (36.6 C)] 97.9 F (36.6 C) (04/20 0453) Pulse Rate:  [58-72] 72 (04/20 0833) Resp:  [12-18] 17 (04/20 0453) BP: (155-178)/(73-88) 155/73 (04/20 0833) SpO2:  [94 %-99 %] 97 % (04/20 0453) Last BM Date: 07/04/16  Intake/Output from previous day: 04/19 0701 - 04/20 0700 In: 2471.3 [I.V.:2471.3] Out: 1425 [Urine:1425] Intake/Output this shift: No intake/output data recorded.  PE: General- In NAD.  Awake and alert. Voice good. Neck-dressing dry, minimal swelling.  Lab Results:  No results for input(s): WBC, HGB, HCT, PLT in the last 72 hours. BMET  Recent Labs  07/05/16 0430  NA 140  K 4.2  CL 103  CO2 28  GLUCOSE 136*  BUN 17  CREATININE 0.72  CALCIUM 9.5   PT/INR No results for input(s): LABPROT, INR in the last 72 hours. Comprehensive Metabolic Panel:    Component Value Date/Time   NA 140 07/05/2016 0430   NA 138 07/02/2016 0826   K 4.2 07/05/2016 0430   K 4.4 07/02/2016 0826   CL 103 07/05/2016 0430   CL 105 07/02/2016 0826   CO2 28 07/05/2016 0430   CO2 27 07/02/2016 0826   BUN 17 07/05/2016 0430   BUN 20 07/02/2016 0826   CREATININE 0.72 07/05/2016 0430   CREATININE 0.68 07/02/2016 0826   CREATININE 0.72 02/06/2016 1030   GLUCOSE 136 (H) 07/05/2016 0430   GLUCOSE 91 07/02/2016 0826   CALCIUM 9.5 07/05/2016 0430   CALCIUM 11.1 (H) 07/02/2016 0826   AST 19 07/02/2016 0826   AST 17 02/06/2016 1030   ALT 20 07/02/2016 0826   ALT 21 02/06/2016 1030   ALKPHOS 63 07/02/2016 0826   ALKPHOS 73 02/06/2016 1030   BILITOT 0.7 07/02/2016 0826   BILITOT 0.4 02/06/2016 1030   PROT 7.0  07/02/2016 0826   PROT 6.9 02/06/2016 1030   ALBUMIN 3.9 07/02/2016 0826   ALBUMIN 4.3 02/06/2016 1030     Studies/Results: No results found.  Anti-infectives: Anti-infectives    Start     Dose/Rate Route Frequency Ordered Stop   07/04/16 0752  ceFAZolin (ANCEF) IVPB 2g/100 mL premix     2 g 200 mL/hr over 30 Minutes Intravenous On call to O.R. 07/04/16 0752 07/04/16 1117       Todd Patterson J 07/05/2016

## 2016-07-23 ENCOUNTER — Encounter: Payer: Self-pay | Admitting: Gastroenterology

## 2016-08-06 ENCOUNTER — Ambulatory Visit (AMBULATORY_SURGERY_CENTER): Payer: Medicare HMO | Admitting: Gastroenterology

## 2016-08-06 ENCOUNTER — Encounter: Payer: Self-pay | Admitting: Gastroenterology

## 2016-08-06 VITALS — BP 149/83 | HR 53 | Temp 99.5°F | Resp 12 | Ht 70.0 in | Wt 199.0 lb

## 2016-08-06 DIAGNOSIS — Z860100 Personal history of colon polyps, unspecified: Secondary | ICD-10-CM

## 2016-08-06 DIAGNOSIS — I10 Essential (primary) hypertension: Secondary | ICD-10-CM | POA: Diagnosis not present

## 2016-08-06 DIAGNOSIS — D128 Benign neoplasm of rectum: Secondary | ICD-10-CM

## 2016-08-06 DIAGNOSIS — K621 Rectal polyp: Secondary | ICD-10-CM

## 2016-08-06 DIAGNOSIS — D120 Benign neoplasm of cecum: Secondary | ICD-10-CM | POA: Diagnosis not present

## 2016-08-06 DIAGNOSIS — I252 Old myocardial infarction: Secondary | ICD-10-CM | POA: Diagnosis not present

## 2016-08-06 MED ORDER — SODIUM CHLORIDE 0.9 % IV SOLN: 500.0000 mL | INTRAVENOUS | Status: DC

## 2016-08-06 NOTE — Op Note (Addendum)
[facility] Patient Name: Todd Patterson Procedure Date: 08/06/2016 8:41 AM MRN: [id number] Endoscopist: Mauri Pole , MD Age: 66 Referring MD:  Date of Birth: 12/24/1950 Gender: Male Account #: [id number] Procedure:                Colonoscopy Indications:              High risk colon cancer surveillance: Personal                            history of adenoma (10 mm or greater in size), High                            risk colon cancer surveillance: Personal history of                            adenoma with villous component, High risk colon                            cancer surveillance: Personal history of multiple                            (3 or more) adenomas Medicines:                Monitored Anesthesia Care Procedure:                Pre-Anesthesia Assessment:                           - Prior to the procedure, a History and Physical                            was performed, and patient medications and                            allergies were reviewed. The patient's tolerance of                            previous anesthesia was also reviewed. The risks                            and benefits of the procedure and the sedation                            options and risks were discussed with the patient.                            All questions were answered, and informed consent                            was obtained. Prior Anticoagulants: The patient has                            taken no previous anticoagulant or antiplatelet  agents. ASA Grade Assessment: II - A patient with                            mild systemic disease. After reviewing the risks                            and benefits, the patient was deemed in                            satisfactory condition to undergo the procedure.                           After obtaining informed consent, the colonoscope                            was passed under direct vision.  Throughout the                            procedure, the patient's blood pressure, pulse, and                            oxygen saturations were monitored continuously. The                            Colonoscope was introduced through the anus and                            advanced to the the terminal ileum, with                            identification of the appendiceal orifice and IC                            valve. The colonoscopy was performed without                            difficulty. The patient tolerated the procedure                            well. The quality of the bowel preparation was                            excellent. The terminal ileum, ileocecal valve,                            appendiceal orifice, and rectum were photographed. Scope In: 8:50:16 AM Scope Out: 9:44:54 AM Scope Withdrawal Time: 0 hours 48 minutes 58 seconds  Total Procedure Duration: 0 hours 54 minutes 38 seconds  Findings:                 The perianal exam findings include a perianal rash                            with scratch marks.  A greater than 50 mm polyp was found in the cecum.                            The polyp was multi-lobulated. Polyp resection was                            incomplete due to polyp size (too large to be                            completely excised) with prolonged                            sedation/procedure time and the polypectomy being                            technically difficult and complex. The polyp was                            partially removed with a hot snare. Polyp resection                            was incomplete. The resected tissue was retrieved.                            To prevent bleeding after the polypectomy, one                            hemostatic clip was successfully placed (MR                            conditional) at the edge where noted some oozing.                            There was no bleeding at the  end of the procedure.                            To repair the defect on opposite wall from snare                            trauma while cutting the resected polyp tissue to                            retrieve, the tissue edges were approximated and                            two hemostatic clips were successfully placed (MR                            conditional). Closure of the defect was successful.                            There was no bleeding at the end of the procedure.  A 2 mm polyp was found in the rectum. The polyp was                            sessile. The polyp was removed with a cold biopsy                            forceps. Resection and retrieval were complete.                           Multiple small-mouthed diverticula were found in                            the sigmoid colon.                           Non-bleeding internal hemorrhoids were found during                            retroflexion. The hemorrhoids were small. Complications:            No immediate complications. Estimated Blood Loss:     Estimated blood loss was minimal. Impression:               - Perianal rash found on perianal exam.                           - One greater than 50 mm polyp in the cecum,                            removed with a hot snare. Incomplete resection.                            Resected tissue retrieved. Clip (MR conditional)                            was placed. Clips (MR conditional) were placed.                           - One 2 mm polyp in the rectum, removed with a cold                            biopsy forceps. Resected and retrieved.                           - Diverticulosis in the sigmoid colon.                           - Non-bleeding internal hemorrhoids. Recommendation:           - Patient has a contact number available for                            emergencies. The signs and symptoms of potential  delayed  complications were discussed with the                            patient. Return to normal activities tomorrow.                            Written discharge instructions were provided to the                            patient.                           - Full liquid diet today, then advance as tolerated                            to soft diet for 1 week.                           - Continue present medications.                           - No aspirin, ibuprofen, naproxen, or other                            non-steroidal anti-inflammatory drugs for 2 weeks.                           - Await pathology results.                           - Repeat colonoscopy is recommended for                            retreatment. The colonoscopy date will be                            determined after pathology results from today's                            exam become available for review. Mauri Pole, MD 08/06/2016 9:56:03 AM This report has been signed electronically.

## 2016-08-06 NOTE — Patient Instructions (Addendum)
YOU HAD AN ENDOSCOPIC PROCEDURE TODAY AT [facility]:   Refer to the procedure report that was given to you for any specific questions about what was found during the examination.  If the procedure report does not answer your questions, please call your gastroenterologist to clarify.  If you requested that your care partner not be given the details of your procedure findings, then the procedure report has been included in a sealed envelope for you to review at your convenience later.  YOU SHOULD EXPECT: Some feelings of bloating in the abdomen. Passage of more gas than usual.  Walking can help get rid of the air that was put into your GI tract during the procedure and reduce the bloating. If you had a lower endoscopy (such as a colonoscopy or flexible sigmoidoscopy) you may notice spotting of blood in your stool or on the toilet paper. If you underwent a bowel prep for your procedure, you may not have a normal bowel movement for a few days.  Please Note:  You might notice some irritation and congestion in your nose or some drainage.  This is from the oxygen used during your procedure.  There is no need for concern and it should clear up in a day or so.  SYMPTOMS TO REPORT IMMEDIATELY:   Following lower endoscopy (colonoscopy or flexible sigmoidoscopy):  Excessive amounts of blood in the stool  Significant tenderness or worsening of abdominal pains  Swelling of the abdomen that is new, acute  Fever of 100F or higher   For urgent or emergent issues, a gastroenterologist can be reached at any hour by calling [phone].   DIET:  Please follow handout for diet full liquid diet the rest of today.  Advance to sift diet tomorrow for 1 week.  Then advance to your regulkar diet after 1 week of soft foods.  Drink plenty of fluids but you should avoid alcoholic beverages for 24 hours.  ACTIVITY:  You should plan to take it easy for the rest of today and you should NOT DRIVE or use  heavy machinery until tomorrow (because of the sedation medicines used during the test).    FOLLOW UP: Our staff will call the number listed on your records the next business day following your procedure to check on you and address any questions or concerns that you may have regarding the information given to you following your procedure. If we do not reach you, we will leave a message.  However, if you are feeling well and you are not experiencing any problems, there is no need to return our call.  We will assume that you have returned to your regular daily activities without incident.  If any biopsies were taken you will be contacted by phone or by letter within the next 1-3 weeks.  Please call us at [phone] if you have not heard about the biopsies in 3 weeks.    SIGNATURES/CONFIDENTIALITY: You and/or your care partner have signed paperwork which will be entered into your electronic medical record.  These signatures attest to the fact that that the information above on your After Visit Summary has been reviewed and is understood.  Full responsibility of the confidentiality of this discharge information lies with you and/or your care-partner.    Handouts were given to your care partner on full liquid diet, soft diet, polyps, diverticulosis, and hemorrhoids. Please stay on a full liquid diet the rest of today then advance to a soft diet for 1  week per Dr. Silverio Decamp.  You may resume your regular diet after the week of soft diet.   No aspirin, aspirin products,  ibuprofen, naproxen, advil, motrin, aleve, or other non-steroidal anti-inflammatory drugs for 14 days after polyp removal. You may resume your other current medications today. Await biopsy results. Please call if any questions or concerns.

## 2016-08-06 NOTE — Progress Notes (Signed)
No problems noted in the recovery room. Maw  Per Dr. Silverio Decamp pt may take asa 81 mg, but if he chooses to hold it pt to call his cardiologist to get approval.  Pt's wife said she will call them to day.  Maw

## 2016-08-06 NOTE — Progress Notes (Signed)
  [facility] Post-op Note  Patient: Todd Patterson  Procedure(s) Performed: colonoscopy  Patient Location: [facility] - Recovery Area  Anesthesia Type: Deep Sedation/Propofol  Level of Consciousness: awake, oriented and patient cooperative  Airway and Oxygen Therapy: Patient Spontanous Breathing  Post-op Pain: none  Post-op Assessment:  Post-op Vital signs reviewed, Patient's Cardiovascular Status Stable, Respiratory Function Stable, Patent Airway, No signs of Nausea or vomiting and Pain level controlled  Post-op Vital Signs: Reviewed and stable  Complications: No apparent anesthesia complications  Christasia Angeletti E Katiria Calame 9:50 AM

## 2016-08-06 NOTE — Progress Notes (Signed)
Called to room to assist during endoscopic procedure.  Patient ID and intended procedure confirmed with present staff. Received instructions for my participation in the procedure from the performing physician.  

## 2016-08-07 ENCOUNTER — Telehealth: Payer: Self-pay | Admitting: *Deleted

## 2016-08-07 NOTE — Telephone Encounter (Signed)
  Follow up Call-  Call back number 08/06/2016 04/24/2016  Post procedure Call Back phone  # [phone]  Permission to leave phone message Yes Yes  Some recent data might be hidden     Patient questions:  Do you have a fever, pain , or abdominal swelling? No. Pain Score  0 *  Have you tolerated food without any problems? Yes.    Have you been able to return to your normal activities? Yes.    Do you have any questions about your discharge instructions: Diet   No. Medications  No. Follow up visit  No.  Do you have questions or concerns about your Care? No.  Actions: * If pain score is 4 or above: No action needed, pain <4.

## 2016-08-17 NOTE — Addendum Note (Signed)
Addendum  created 08/17/16 0831 by Duane Boston, MD   Sign clinical note

## 2016-08-19 DIAGNOSIS — E210 Primary hyperparathyroidism: Secondary | ICD-10-CM | POA: Diagnosis not present

## 2016-09-11 ENCOUNTER — Other Ambulatory Visit: Payer: Self-pay | Admitting: Family Medicine

## 2016-10-17 DIAGNOSIS — D120 Benign neoplasm of cecum: Secondary | ICD-10-CM | POA: Diagnosis not present

## 2016-10-17 DIAGNOSIS — D126 Benign neoplasm of colon, unspecified: Secondary | ICD-10-CM | POA: Diagnosis not present

## 2016-10-17 DIAGNOSIS — Z1211 Encounter for screening for malignant neoplasm of colon: Secondary | ICD-10-CM | POA: Diagnosis not present

## 2016-12-31 ENCOUNTER — Encounter: Payer: Self-pay | Admitting: Family Medicine

## 2016-12-31 ENCOUNTER — Ambulatory Visit (INDEPENDENT_AMBULATORY_CARE_PROVIDER_SITE_OTHER): Payer: Medicare HMO | Admitting: Family Medicine

## 2016-12-31 VITALS — BP 132/84 | HR 63 | Temp 98.0°F | Resp 16 | Ht 68.0 in | Wt 204.0 lb

## 2016-12-31 DIAGNOSIS — I10 Essential (primary) hypertension: Secondary | ICD-10-CM | POA: Diagnosis not present

## 2016-12-31 DIAGNOSIS — Z125 Encounter for screening for malignant neoplasm of prostate: Secondary | ICD-10-CM | POA: Diagnosis not present

## 2016-12-31 DIAGNOSIS — I2510 Atherosclerotic heart disease of native coronary artery without angina pectoris: Secondary | ICD-10-CM | POA: Diagnosis not present

## 2016-12-31 DIAGNOSIS — Z0000 Encounter for general adult medical examination without abnormal findings: Secondary | ICD-10-CM | POA: Diagnosis not present

## 2016-12-31 DIAGNOSIS — E213 Hyperparathyroidism, unspecified: Secondary | ICD-10-CM

## 2016-12-31 DIAGNOSIS — Z23 Encounter for immunization: Secondary | ICD-10-CM | POA: Diagnosis not present

## 2016-12-31 DIAGNOSIS — Z1159 Encounter for screening for other viral diseases: Secondary | ICD-10-CM

## 2016-12-31 MED ORDER — LOSARTAN POTASSIUM 50 MG PO TABS: 50.0000 mg | ORAL_TABLET | Freq: Every day | ORAL | 3 refills | Status: DC

## 2016-12-31 NOTE — Progress Notes (Signed)
Subjective:    Patient ID: Todd Patterson, male    DOB: 22-Jun-1950, 66 y.o.   MRN: [id number]  HPI Patient is a very pleasant 66 year old white male here today for CPE.  Past medical history is significant for myocardial infarction in 2008. Patient underwent PTCA with stenting.  Patient states that he had a bare-metal stent. He was continued on aspirin and Plavix for 2 years and then was instructed to continue aspirin thereafter. He also has a history of hyperlipidemia and hypertension.  Since I last saw the patient, he underwent a parathyroidectomy for hyperparathyroidism He also has undergone 2 colonoscopies and is scheduled for a third due to a colon polyp greater than 3 cm per his report. The biopsy result was negative for malignancy however they are watching him closely and he is scheduled for colonoscopy later this year again at [facility]. He is due for a PSA. Pneumovax 23 is up-to-date. He is due for Prevnar 13 and a flu shot. We also discussed the Shingles vaccine Past Medical History:  Diagnosis Date  . Hyperlipidemia   . Hypertension   . Hyperthyroidism    hyperparthyroid  . Myocardial infarction [facility]) 2009   Has 1 stent   Past Surgical History:  Procedure Laterality Date  . APPENDECTOMY    . CORONARY ANGIOPLASTY WITH STENT PLACEMENT  2009   1 stent  . PARATHYROIDECTOMY N/A 07/04/2016   Procedure: PARATHYROIDECTOMY;  Surgeon: Jackolyn Confer, MD;  Location: [facility];  Service: General;  Laterality: N/A;  . TONSILLECTOMY     Current Outpatient Prescriptions on File Prior to Visit  Medication Sig Dispense Refill  . carvedilol (COREG) 6.25 MG tablet TAKE 1 TABLET (6.25 MG TOTAL) BY MOUTH 2 (TWO) TIMES A DAY. 180 tablet 1  . ibuprofen (ADVIL,MOTRIN) 200 MG tablet Take 600 mg by mouth daily as needed for mild pain (pain).     Marland Kitchen lisinopril (PRINIVIL,ZESTRIL) 20 MG tablet Take 1 tablet (20 mg total) by mouth daily. 90 tablet 3  . oxyCODONE (OXY IR/ROXICODONE) 5 MG immediate release tablet  Take 1-2 tablets (5-10 mg total) by mouth every 4 (four) hours as needed for moderate pain. 30 tablet 0  . simvastatin (ZOCOR) 40 MG tablet TAKE 1 TABLET (40 MG TOTAL) BY MOUTH NIGHTLY. 90 tablet 1   Current Facility-Administered Medications on File Prior to Visit  Medication Dose Route Frequency Provider Last Rate Last Dose  . 0.9 %  sodium chloride infusion  500 mL Intravenous Continuous Nandigam, Kavitha V, MD      . 0.9 %  sodium chloride infusion  500 mL Intravenous Continuous Nandigam, Venia Minks, MD       Allergies  Allergen Reactions  . Lisinopril Cough   Social History   Social History  . Marital status: Married    Spouse name: N/A  . Number of children: N/A  . Years of education: N/A   Occupational History  . Not on file.   Social History Main Topics  . Smoking status: Former Smoker    Types: Cigarettes    Quit date: 03/19/2007  . Smokeless tobacco: Never Used  . Alcohol use 7.2 - 8.4 oz/week    12 - 14 Cans of beer per week  . Drug use: No  . Sexual activity: Yes   Other Topics Concern  . Not on file   Social History Narrative  . No narrative on file   Family History  Problem Relation Age of Onset  . Dementia Mother   .  Heart disease Father   . Cancer Sister   . Breast cancer Sister       Review of Systems  All other systems reviewed and are negative.      Objective:   Physical Exam  Constitutional: He is oriented to person, place, and time. He appears well-developed and well-nourished. No distress.  HENT:  Head: Normocephalic and atraumatic.  Right Ear: External ear normal.  Left Ear: External ear normal.  Nose: Nose normal.  Mouth/Throat: Oropharynx is clear and moist. No oropharyngeal exudate.  Eyes: Pupils are equal, round, and reactive to light. Conjunctivae and EOM are normal. Right eye exhibits no discharge. Left eye exhibits no discharge. No scleral icterus.  Neck: Normal range of motion. Neck supple. No JVD present. No tracheal  deviation present. No thyromegaly present.  Cardiovascular: Normal rate, regular rhythm, normal heart sounds and intact distal pulses.  Exam reveals no gallop and no friction rub.   No murmur heard. Pulmonary/Chest: Effort normal and breath sounds normal. No stridor. No respiratory distress. He has no wheezes. He has no rales. He exhibits no tenderness.  Abdominal: Soft. Bowel sounds are normal. He exhibits no distension and no mass. There is no tenderness. There is no rebound and no guarding.  Musculoskeletal: Normal range of motion. He exhibits no edema, tenderness or deformity.  Lymphadenopathy:    He has no cervical adenopathy.  Neurological: He is alert and oriented to person, place, and time. He has normal reflexes. No cranial nerve deficit. He exhibits normal muscle tone. Coordination normal.  Skin: Skin is warm. No rash noted. He is not diaphoretic. No erythema. No pallor.  Psychiatric: He has a normal mood and affect. His behavior is normal. Judgment and thought content normal.  Vitals reviewed.         Assessment & Plan:  Benign essential HTN - Plan: CBC with Differential/Platelet, COMPLETE METABOLIC PANEL WITH GFR, Lipid panel  Prostate cancer screening - Plan: PSA  Encounter for hepatitis C screening test for low risk patient - Plan: Hepatitis C Antibody  Flu vaccine need - Plan: Flu Vaccine QUAD 36+ mos IM  ASCVD (arteriosclerotic cardiovascular disease)  Hyperparathyroidism ([place])  General medical exam blood pressure is well controlled. He discontinued lisinopril due to cough. I will replace it with losartan 50 mg a day. I will check a fasting lipid panel.Goal LDL cholesterol is less than 70. He is due for hepatitis C screening. He is also due for PSA for prostate cancer screening. He received his flu shot today. I recommended Prevnar 13 but we do not have that vaccine in stock. He can get that again at a later date. I will check a CMP to monitor his calcium levels. I  will check a CBC as well as a fasting lipid panel. The remainder of his preventative care is up-to-date

## 2017-01-01 LAB — CBC WITH DIFFERENTIAL/PLATELET
Basophils Absolute: 58 cells/uL (ref 0–200)
Basophils Relative: 0.6 %
EOS PCT: 4.6 %
Eosinophils Absolute: 446 cells/uL (ref 15–500)
HCT: 45.2 % (ref 38.5–50.0)
Hemoglobin: 15.3 g/dL (ref 13.2–17.1)
LYMPHS ABS: 2774 {cells}/uL (ref 850–3900)
MCH: 29.7 pg (ref 27.0–33.0)
MCHC: 33.8 g/dL (ref 32.0–36.0)
MCV: 87.6 fL (ref 80.0–100.0)
MONOS PCT: 10.6 %
MPV: 10.7 fL (ref 7.5–12.5)
NEUTROS PCT: 55.6 %
Neutro Abs: 5393 cells/uL (ref 1500–7800)
PLATELETS: 275 10*3/uL (ref 140–400)
RBC: 5.16 10*6/uL (ref 4.20–5.80)
RDW: 12.5 % (ref 11.0–15.0)
TOTAL LYMPHOCYTE: 28.6 %
WBC mixed population: 1028 cells/uL — ABNORMAL HIGH (ref 200–950)
WBC: 9.7 10*3/uL (ref 3.8–10.8)

## 2017-01-01 LAB — COMPLETE METABOLIC PANEL WITH GFR
AG Ratio: 1.5 (calc) (ref 1.0–2.5)
ALKALINE PHOSPHATASE (APISO): 51 U/L (ref 40–115)
ALT: 20 U/L (ref 9–46)
AST: 14 U/L (ref 10–35)
Albumin: 4.1 g/dL (ref 3.6–5.1)
BUN: 11 mg/dL (ref 7–25)
CO2: 26 mmol/L (ref 20–32)
CREATININE: 0.72 mg/dL (ref 0.70–1.25)
Calcium: 9.3 mg/dL (ref 8.6–10.3)
Chloride: 104 mmol/L (ref 98–110)
GFR, EST NON AFRICAN AMERICAN: 97 mL/min/{1.73_m2} (ref >=60)
GFR, Est African American: 113 mL/min/{1.73_m2} (ref >=60)
GLOBULIN: 2.7 g/dL (ref 1.9–3.7)
GLUCOSE: 92 mg/dL (ref 65–99)
Potassium: 4.7 mmol/L (ref 3.5–5.3)
SODIUM: 140 mmol/L (ref 135–146)
Total Bilirubin: 0.5 mg/dL (ref 0.2–1.2)
Total Protein: 6.8 g/dL (ref 6.1–8.1)

## 2017-01-01 LAB — LIPID PANEL
CHOL/HDL RATIO: 3 (calc) (ref <5.0)
CHOLESTEROL: 198 mg/dL (ref <200)
HDL: 67 mg/dL (ref >40)
LDL Cholesterol (Calc): 108 mg/dL (calc) — ABNORMAL HIGH
Non-HDL Cholesterol (Calc): 131 mg/dL (calc) — ABNORMAL HIGH (ref <130)
TRIGLYCERIDES: 118 mg/dL (ref <150)

## 2017-01-01 LAB — HEPATITIS C ANTIBODY
Hepatitis C Ab: NONREACTIVE
SIGNAL TO CUT-OFF: 0.01 (ref <1.00)

## 2017-01-01 LAB — PSA: PSA: 4.6 ng/mL — ABNORMAL HIGH (ref <=4.0)

## 2017-01-03 ENCOUNTER — Other Ambulatory Visit: Payer: Self-pay

## 2017-01-03 DIAGNOSIS — R972 Elevated prostate specific antigen [PSA]: Secondary | ICD-10-CM

## 2017-01-03 DIAGNOSIS — E7800 Pure hypercholesterolemia, unspecified: Secondary | ICD-10-CM

## 2017-01-03 MED ORDER — ATORVASTATIN CALCIUM 80 MG PO TABS: 80.0000 mg | ORAL_TABLET | Freq: Every day | ORAL | 3 refills | Status: DC

## 2017-01-14 ENCOUNTER — Ambulatory Visit: Payer: Medicare HMO | Admitting: Family Medicine

## 2017-01-14 DIAGNOSIS — R69 Illness, unspecified: Secondary | ICD-10-CM | POA: Diagnosis not present

## 2017-01-16 ENCOUNTER — Ambulatory Visit (INDEPENDENT_AMBULATORY_CARE_PROVIDER_SITE_OTHER): Payer: Medicare HMO | Admitting: Family Medicine

## 2017-01-16 ENCOUNTER — Encounter: Payer: Self-pay | Admitting: Family Medicine

## 2017-01-16 VITALS — BP 136/76 | HR 72 | Temp 97.9°F | Resp 18 | Ht 70.0 in | Wt 206.0 lb

## 2017-01-16 DIAGNOSIS — Z23 Encounter for immunization: Secondary | ICD-10-CM | POA: Diagnosis not present

## 2017-01-16 DIAGNOSIS — S60551A Superficial foreign body of right hand, initial encounter: Secondary | ICD-10-CM | POA: Diagnosis not present

## 2017-01-16 NOTE — Progress Notes (Signed)
Subjective:    Patient ID: Todd Patterson, male    DOB: Oct 13, 1950, 66 y.o.   MRN: [id number]  HPI Patient has a mass on the dorsum of his right hand just distal to the wrist.  It is freely mobile, 6 mm in diameter, it is soft and nontender.  It is very superficial and just below the surface of the skin.  Patient states that is been there for more than a year.  He does not remember any specific injury.  He would like [place] to remove the lesion. Past Medical History:  Diagnosis Date  . Hyperlipidemia   . Hypertension   . Hyperthyroidism    hyperparthyroid  . Myocardial infarction [facility]) 2009   Has 1 stent   Past Surgical History:  Procedure Laterality Date  . APPENDECTOMY    . CORONARY ANGIOPLASTY WITH STENT PLACEMENT  2009   1 stent  . PARATHYROIDECTOMY N/A 07/04/2016   Procedure: PARATHYROIDECTOMY;  Surgeon: Jackolyn Confer, MD;  Location: [facility];  Service: General;  Laterality: N/A;  . TONSILLECTOMY     Current Outpatient Prescriptions on File Prior to Visit  Medication Sig Dispense Refill  . atorvastatin (LIPITOR) 80 MG tablet Take 1 tablet (80 mg total) by mouth daily. 90 tablet 3  . carvedilol (COREG) 6.25 MG tablet TAKE 1 TABLET (6.25 MG TOTAL) BY MOUTH 2 (TWO) TIMES A DAY. 180 tablet 1  . ibuprofen (ADVIL,MOTRIN) 200 MG tablet Take 600 mg by mouth daily as needed for mild pain (pain).     Marland Kitchen lisinopril (PRINIVIL,ZESTRIL) 20 MG tablet Take 1 tablet (20 mg total) by mouth daily. 90 tablet 3  . losartan (COZAAR) 50 MG tablet Take 1 tablet (50 mg total) by mouth daily. Discontinue lisinopril due to cough and replace with losartan 90 tablet 3  . oxyCODONE (OXY IR/ROXICODONE) 5 MG immediate release tablet Take 1-2 tablets (5-10 mg total) by mouth every 4 (four) hours as needed for moderate pain. 30 tablet 0   Current Facility-Administered Medications on File Prior to Visit  Medication Dose Route Frequency Provider Last Rate Last Dose  . 0.9 %  sodium chloride infusion  500 mL  Intravenous Continuous Nandigam, Kavitha V, MD      . 0.9 %  sodium chloride infusion  500 mL Intravenous Continuous Nandigam, Venia Minks, MD       Allergies  Allergen Reactions  . Lisinopril Cough   Social History   Social History  . Marital status: Married    Spouse name: N/A  . Number of children: N/A  . Years of education: N/A   Occupational History  . Not on file.   Social History Main Topics  . Smoking status: Former Smoker    Types: Cigarettes    Quit date: 03/19/2007  . Smokeless tobacco: Never Used  . Alcohol use 7.2 - 8.4 oz/week    12 - 14 Cans of beer per week  . Drug use: No  . Sexual activity: Yes   Other Topics Concern  . Not on file   Social History Narrative  . No narrative on file      Review of Systems  All other systems reviewed and are negative.      Objective:   Physical Exam  Cardiovascular: Normal rate, regular rhythm and normal heart sounds.   Pulmonary/Chest: Effort normal and breath sounds normal.  Musculoskeletal:       Right wrist: He exhibits deformity.       Arms: Vitals reviewed.  See  hpi       Assessment & Plan:  Foreign body, hand, superficial, right, initial encounter  Skin was anesthetized with 0.1% lidocaine with epinephrine.  A 1 cm incision was made just lateral to the subcutaneous cystlike mass.  Forceps was inserted into the incision and the mass was grabbed with a forceps and removed through the incision.  A round yellow soft well circumscribed mass was.  Skin incision was closed with 3 simple interrupted 3-0 Ethilon sutures.  The lesion is obviously nonmalignant.  It appears to be some type of cyst.  Patient declines a biopsy which I feel is appropriate because the lesion does not appear to be pathologic.  Hemostasis was achieved with sutures and a Band-Aid.  Sutures out in 7 days

## 2017-01-16 NOTE — Addendum Note (Signed)
Addended by: Shary Decamp B on: 01/16/2017 04:53 PM   Modules accepted: Orders

## 2017-01-23 ENCOUNTER — Encounter: Payer: Self-pay | Admitting: *Deleted

## 2017-01-23 ENCOUNTER — Ambulatory Visit: Payer: Medicare HMO | Admitting: *Deleted

## 2017-01-23 ENCOUNTER — Other Ambulatory Visit: Payer: Self-pay

## 2017-01-23 DIAGNOSIS — Z4802 Encounter for removal of sutures: Secondary | ICD-10-CM

## 2017-01-23 NOTE — Progress Notes (Signed)
Patient seen in office to have sutures removed.   Patient noted to have 2 sutures to R hand. States that he thinks the top suture was pulled out by putting his hand in his jeans.   Removed 2 sutures and thoroughly searched wound for 3rd stitch. No additional stitch found.   Patient tolerated procedure well.

## 2017-01-29 ENCOUNTER — Encounter: Payer: Self-pay | Admitting: Family Medicine

## 2017-01-29 DIAGNOSIS — Z86010 Personal history of colonic polyps: Secondary | ICD-10-CM | POA: Diagnosis not present

## 2017-01-29 DIAGNOSIS — D123 Benign neoplasm of transverse colon: Secondary | ICD-10-CM | POA: Diagnosis not present

## 2017-01-29 DIAGNOSIS — E890 Postprocedural hypothyroidism: Secondary | ICD-10-CM | POA: Diagnosis not present

## 2017-01-29 DIAGNOSIS — Z79899 Other long term (current) drug therapy: Secondary | ICD-10-CM | POA: Diagnosis not present

## 2017-01-29 DIAGNOSIS — Z7982 Long term (current) use of aspirin: Secondary | ICD-10-CM | POA: Diagnosis not present

## 2017-01-29 DIAGNOSIS — D120 Benign neoplasm of cecum: Secondary | ICD-10-CM | POA: Diagnosis not present

## 2017-01-29 DIAGNOSIS — D122 Benign neoplasm of ascending colon: Secondary | ICD-10-CM | POA: Diagnosis not present

## 2017-01-29 DIAGNOSIS — Z9889 Other specified postprocedural states: Secondary | ICD-10-CM | POA: Diagnosis not present

## 2017-01-29 DIAGNOSIS — D124 Benign neoplasm of descending colon: Secondary | ICD-10-CM | POA: Diagnosis not present

## 2017-01-29 DIAGNOSIS — I10 Essential (primary) hypertension: Secondary | ICD-10-CM | POA: Diagnosis not present

## 2017-01-29 DIAGNOSIS — K635 Polyp of colon: Secondary | ICD-10-CM | POA: Diagnosis not present

## 2017-01-29 DIAGNOSIS — D126 Benign neoplasm of colon, unspecified: Secondary | ICD-10-CM | POA: Diagnosis not present

## 2017-01-29 DIAGNOSIS — Z1211 Encounter for screening for malignant neoplasm of colon: Secondary | ICD-10-CM | POA: Diagnosis not present

## 2017-01-29 DIAGNOSIS — Z955 Presence of coronary angioplasty implant and graft: Secondary | ICD-10-CM | POA: Diagnosis not present

## 2017-02-07 ENCOUNTER — Encounter: Payer: Self-pay | Admitting: Family Medicine

## 2017-03-21 ENCOUNTER — Other Ambulatory Visit: Payer: Self-pay | Admitting: Family Medicine

## 2017-04-07 ENCOUNTER — Other Ambulatory Visit: Payer: Medicare HMO

## 2017-04-07 DIAGNOSIS — E7800 Pure hypercholesterolemia, unspecified: Secondary | ICD-10-CM | POA: Diagnosis not present

## 2017-04-07 DIAGNOSIS — Z79899 Other long term (current) drug therapy: Secondary | ICD-10-CM | POA: Diagnosis not present

## 2017-04-07 LAB — COMPREHENSIVE METABOLIC PANEL
AG Ratio: 1.6 (calc) (ref 1.0–2.5)
ALBUMIN MSPROF: 4.1 g/dL (ref 3.6–5.1)
ALT: 23 U/L (ref 9–46)
AST: 13 U/L (ref 10–35)
Alkaline phosphatase (APISO): 54 U/L (ref 40–115)
BILIRUBIN TOTAL: 0.5 mg/dL (ref 0.2–1.2)
BUN: 16 mg/dL (ref 7–25)
CHLORIDE: 104 mmol/L (ref 98–110)
CO2: 31 mmol/L (ref 20–32)
CREATININE: 0.79 mg/dL (ref 0.70–1.25)
Calcium: 9.3 mg/dL (ref 8.6–10.3)
Globulin: 2.6 g/dL (calc) (ref 1.9–3.7)
Glucose, Bld: 94 mg/dL (ref 65–99)
POTASSIUM: 4.3 mmol/L (ref 3.5–5.3)
SODIUM: 141 mmol/L (ref 135–146)
TOTAL PROTEIN: 6.7 g/dL (ref 6.1–8.1)

## 2017-04-07 LAB — LIPID PANEL
CHOL/HDL RATIO: 3.1 (calc) (ref <5.0)
CHOLESTEROL: 180 mg/dL (ref <200)
HDL: 59 mg/dL (ref >40)
LDL Cholesterol (Calc): 97 mg/dL (calc)
NON-HDL CHOLESTEROL (CALC): 121 mg/dL (ref <130)
Triglycerides: 140 mg/dL (ref <150)

## 2017-04-07 LAB — EXTRA LAV TOP TUBE

## 2017-04-08 ENCOUNTER — Encounter: Payer: Self-pay | Admitting: Family Medicine

## 2017-05-12 DIAGNOSIS — R69 Illness, unspecified: Secondary | ICD-10-CM | POA: Diagnosis not present

## 2017-08-16 DIAGNOSIS — H4923 Sixth [abducent] nerve palsy, bilateral: Secondary | ICD-10-CM

## 2017-08-16 HISTORY — DX: Sixth (abducent) nerve palsy, bilateral: H49.23

## 2017-09-03 ENCOUNTER — Ambulatory Visit (INDEPENDENT_AMBULATORY_CARE_PROVIDER_SITE_OTHER): Payer: Medicare HMO | Admitting: Family Medicine

## 2017-09-03 ENCOUNTER — Other Ambulatory Visit: Payer: Self-pay

## 2017-09-03 ENCOUNTER — Encounter: Payer: Self-pay | Admitting: Family Medicine

## 2017-09-03 VITALS — BP 148/70 | HR 60 | Temp 97.6°F | Resp 16 | Wt 210.2 lb

## 2017-09-03 DIAGNOSIS — R51 Headache: Secondary | ICD-10-CM | POA: Diagnosis not present

## 2017-09-03 DIAGNOSIS — J019 Acute sinusitis, unspecified: Secondary | ICD-10-CM | POA: Diagnosis not present

## 2017-09-03 DIAGNOSIS — H538 Other visual disturbances: Secondary | ICD-10-CM | POA: Diagnosis not present

## 2017-09-03 DIAGNOSIS — R519 Headache, unspecified: Secondary | ICD-10-CM

## 2017-09-03 MED ORDER — FLUTICASONE PROPIONATE 50 MCG/ACT NA SUSP
2.0000 | Freq: Every day | NASAL | 6 refills | Status: DC
Start: 2017-09-03 — End: 2017-09-22

## 2017-09-03 MED ORDER — METHYLPREDNISOLONE ACETATE 80 MG/ML IJ SUSP
80.0000 mg | Freq: Once | INTRAMUSCULAR | Status: AC
  Administered 2017-09-03: 80 mg via INTRAMUSCULAR

## 2017-09-03 MED ORDER — CETIRIZINE HCL 10 MG PO TABS: 10.0000 mg | ORAL_TABLET | Freq: Every day | ORAL | 11 refills | Status: DC

## 2017-09-03 MED ORDER — AMOXICILLIN-POT CLAVULANATE 875-125 MG PO TABS: 1.0000 | ORAL_TABLET | Freq: Two times a day (BID) | ORAL | 0 refills | Status: DC

## 2017-09-03 NOTE — Patient Instructions (Addendum)
Continue to use your allergy medicine daily, does not matter if it Zyrtec, Allegra or Claritin. Adequate nasal spray daily, 2 sprays into both nostrils every day.  If you taste it you waste it.  Take Tylenol and ibuprofen as allowed and as directed on the box for aches and pains.  Hold the antibiotic prescription, and only fill if you have sudden worsening of sinus pain or pressure, severe tenderness in your face cheeks and forehead, with fever or if your symptoms last longer than 8 to 10 days total.   Follow-up with an eye doctor or I can refer you to ophthalmology, you do need your vision rechecked and followed up with closely.  If you have any sudden vision changes or worsening, any fever, neck stiffness, vomiting associated with your headache and vision changes you need to go to the nearest ER for evaluation.

## 2017-09-03 NOTE — Progress Notes (Signed)
Patient ID: Todd Patterson, male    DOB: 09-10-1950, 67 y.o.   MRN: [id number]  PCP: [name], MD  Chief Complaint  Patient presents with  . Headache    blurred vision, neck pain, pressure in face    Subjective:   Todd Patterson is a 67 y.o. male, presents to clinic with CC of sinus pain and pressure x 4 days.  He states that it first began while he was outside over the weekend he had been working the yard and later golfing, he began having right nostril drainage and right eye drainage and intermittent itching of both eyes.  Over the next 2 days he developed pressure in his sinuses, nose and temples bilaterally.  On Monday he was out golfing and noticed that his vision became blurry, he reports double vision when looking at things far away, no double vision but blurry vision with up close site.  He wears readers but normally has 20/20 vision for distance.  On Monday night, 2 nights ago, he developed a gradual headache which she describes as a pressure which radiates across the front of his face and forehead also some radiation to his neck and back of his head, described only as a pressure, states that the pain is mild without any other alleviating or aggravating factors.  He has tried 3 days of Zyrtec without any improvement.  He denies any eye drainage, eye redness, neck stiffness, fever, sore throat, photophobia, nausea, vomiting.  States he has not had any facial asymmetry, slurred speech, confusion, near-syncope, numbness, weakness, change to gait.  Is double vision has been fairly constant for 2 days.  He denies any other symptoms regarding his face, new drooping, no current drainage from eyes, he is not having any difficulty chewing or swallowing does not feel like he has any weakness in his face.   He also reports that his wife has had similar symptoms, was seen by her doctor, given a steroid shot and improved significantly.  She was when who encouraged him to come in today since they  are going out of town this weekend, she wanted him to get a shot so he would also improve for their quick trip away.   Patient states that since moving to [place] he has had severe seasonal allergies that have seemed to worsen every year.    He denies any shortness of breath, cough, chest pain, palpitations, near-syncope.    Patient Active Problem List   Diagnosis Date Noted  . Primary hyperparathyroidism ([place]) 07/04/2016  . Hyperlipidemia 04/26/2016  . Essential hypertension 04/26/2016  . CAD (coronary artery disease)      Prior to Admission medications   Medication Sig Start Date End Date Taking? Authorizing Provider  atorvastatin (LIPITOR) 80 MG tablet Take 1 tablet (80 mg total) by mouth daily. 01/03/17  Yes [name], MD  carvedilol (COREG) 6.25 MG tablet TAKE 1 TABLET BY MOUTH TWICE A DAY 03/21/17  Yes [name], MD  lisinopril (PRINIVIL,ZESTRIL) 20 MG tablet Take 1 tablet (20 mg total) by mouth daily. 03/19/16  Yes [name], MD  losartan (COZAAR) 50 MG tablet Take 1 tablet (50 mg total) by mouth daily. Discontinue lisinopril due to cough and replace with losartan 12/31/16  Yes [name], MD  amoxicillin-clavulanate (AUGMENTIN) 875-125 MG tablet Take 1 tablet by mouth 2 (two) times daily. 09/03/17   Delsa Grana, PA-C  cetirizine (ZYRTEC) 10 MG tablet Take 1 tablet (10 mg total)  by mouth daily. 09/03/17   Delsa Grana, PA-C  fluticasone (FLONASE) 50 MCG/ACT nasal spray Place 2 sprays into both nostrils daily. 09/03/17   Delsa Grana, PA-C  ibuprofen (ADVIL,MOTRIN) 200 MG tablet Take 600 mg by mouth daily as needed for mild pain (pain).     [provider]     Allergies  Allergen Reactions  . Lisinopril Cough     Family History  Problem Relation Age of Onset  . Dementia Mother   . Heart disease Father   . Cancer Sister   . Breast cancer Sister      Social History   Socioeconomic History  . Marital status: Married    Spouse  name: Not on file  . Number of children: Not on file  . Years of education: Not on file  . Highest education level: Not on file  Occupational History  . Not on file  Social Needs  . Financial resource strain: Not on file  . Food insecurity:    Worry: Not on file    Inability: Not on file  . Transportation needs:    Medical: Not on file    Non-medical: Not on file  Tobacco Use  . Smoking status: Former Smoker    Types: Cigarettes    Last attempt to quit: 03/19/2007    Years since quitting: 10.4  . Smokeless tobacco: Never Used  Substance and Sexual Activity  . Alcohol use: Yes    Alcohol/week: 7.2 - 8.4 oz    Types: 12 - 14 Cans of beer per week  . Drug use: No  . Sexual activity: Yes  Lifestyle  . Physical activity:    Days per week: Not on file    Minutes per session: Not on file  . Stress: Not on file  Relationships  . Social connections:    Talks on phone: Not on file    Gets together: Not on file    Attends religious service: Not on file    Active member of club or organization: Not on file    Attends meetings of clubs or organizations: Not on file    Relationship status: Not on file  . Intimate partner violence:    Fear of current or ex partner: Not on file    Emotionally abused: Not on file    Physically abused: Not on file    Forced sexual activity: Not on file  Other Topics Concern  . Not on file  Social History Narrative  . Not on file     Review of Systems  Constitutional: Negative.  Negative for activity change, appetite change, chills, diaphoresis, fatigue, fever and unexpected weight change.  HENT: Positive for congestion, rhinorrhea, sinus pressure and sinus pain. Negative for ear discharge, ear pain, facial swelling, hearing loss, mouth sores, nosebleeds, postnasal drip, sore throat, tinnitus, trouble swallowing and voice change.   Eyes: Negative.   Respiratory: Negative.  Negative for cough, chest tightness, shortness of breath and wheezing.     Cardiovascular: Negative.   Gastrointestinal: Negative.   Endocrine: Negative.   Genitourinary: Negative.   Musculoskeletal: Negative.   Allergic/Immunologic: Positive for environmental allergies. Negative for immunocompromised state.  Neurological: Positive for headaches. Negative for dizziness, tremors, seizures, syncope, facial asymmetry, speech difficulty, weakness, light-headedness and numbness.  Hematological: Negative.   Psychiatric/Behavioral: Negative.   All other systems reviewed and are negative.      Objective:    Vitals:   09/03/17 1535  BP: (!) 148/70  Pulse: 60  Resp: 16  Temp: 97.6 F (36.4 C)  TempSrc: Oral  SpO2: 98%  Weight: 210 lb 4 oz (95.4 kg)      Physical Exam  Constitutional: He is oriented to person, place, and time. He appears well-developed and well-nourished.  Non-toxic appearance. He does not appear ill. No distress.  Well-appearing male, appears stated age, no acute distress   HENT:  Head: Normocephalic and atraumatic.  Right Ear: Hearing, external ear and ear canal normal.  Left Ear: Hearing, external ear and ear canal normal.  Nose: Mucosal edema, rhinorrhea and sinus tenderness present. No nasal deformity or septal deviation. No epistaxis.  No foreign bodies. Right sinus exhibits no maxillary sinus tenderness and no frontal sinus tenderness. Left sinus exhibits no maxillary sinus tenderness and no frontal sinus tenderness.  Mouth/Throat: Uvula is midline, oropharynx is clear and moist and mucous membranes are normal. Mucous membranes are not pale, not dry and not cyanotic. No trismus in the jaw. No uvula swelling. No oropharyngeal exudate, posterior oropharyngeal edema, posterior oropharyngeal erythema or tonsillar abscesses. No tonsillar exudate.  Very mild bilateral maxillary sinus tenderness to palpation Nasal mucosa diffusely erythematous with moderate discharge and congestion TMs bilaterally occluded with soft brown cerumen  Eyes:  Pupils are equal, round, and reactive to light. Conjunctivae, EOM and lids are normal. Lids are everted and swept, no foreign bodies found. Right eye exhibits no discharge and no exudate. Left eye exhibits no discharge and no exudate. Right conjunctiva is not injected. Right conjunctiva has no hemorrhage. Left conjunctiva is not injected. Left conjunctiva has no hemorrhage. Right eye exhibits normal extraocular motion and no nystagmus. Left eye exhibits normal extraocular motion and no nystagmus. Right pupil is round and reactive. Left pupil is round and reactive.  Neck: Trachea normal, normal range of motion, full passive range of motion without pain and phonation normal. Neck supple. Normal carotid pulses and no JVD present. No tracheal tenderness, no spinous process tenderness and no muscular tenderness present. No neck rigidity. No tracheal deviation, no edema, no erythema and normal range of motion present. No Brudzinski's sign and no Kernig's sign noted. No thyroid mass present.  Cardiovascular: Regular rhythm, normal heart sounds and normal pulses. Exam reveals no gallop and no friction rub.  No murmur heard. Pulses:      Radial pulses are 2+ on the right side, and 2+ on the left side.       Posterior tibial pulses are 2+ on the right side, and 2+ on the left side.  Pulmonary/Chest: Effort normal and breath sounds normal. No stridor. No respiratory distress. He has no wheezes. He has no rhonchi. He has no rales.  Abdominal: Soft. Normal appearance and bowel sounds are normal. He exhibits no distension. There is no tenderness. There is no rebound and no guarding.  Musculoskeletal: Normal range of motion. He exhibits no edema.  Lymphadenopathy:    He has no cervical adenopathy.  Neurological: He is alert and oriented to person, place, and time. He has normal strength. No cranial nerve deficit or sensory deficit. He displays a negative Romberg sign. Coordination and gait normal.  MENTAL STATUS:  AAOx3, memory intact, fund of knowledge appropriate  LANG/SPEECH: Naming and repetition intact, fluent, follows 3-step commands  CRANIAL NERVES:   II: Pupils equal and reactive, no RAPD, no VF deficits   III, IV, VI: EOM intact, no gaze preference or deviation, no nystagmus.   V: normal sensation in V1, V2, and V3 segments bilaterally   VII: no  asymmetry, no nasolabial fold flattening   VIII: normal hearing to speech   IX, X: normal palatal elevation, no uvular deviation   XI: 5/5 head turn and 5/5 shoulder shrug bilaterally   XII: midline tongue protrusion  MOTOR:  5/5 bilateral grip strength 5/5 strength dorsiflexion/plantarflexion b/l  SENSORY:  Normal to light touch Romberg absent  COORD: Normal finger to nose and heel to shin, no tremor, no dysmetria  STATION: normal stance, no truncal ataxia  GAIT: Normal; patient able to tip-toe, heel-walk.   Skin: Skin is warm, dry and intact. Capillary refill takes less than 2 seconds. No rash noted. He is not diaphoretic. No cyanosis.  Psychiatric: He has a normal mood and affect. His speech is normal and behavior is normal.  Nursing note and vitals reviewed.   Visual acuity - see chart      Assessment & Plan:      ICD-10-CM   1. Acute non-recurrent sinusitis, unspecified location J01.90 fluticasone (FLONASE) 50 MCG/ACT nasal spray    cetirizine (ZYRTEC) 10 MG tablet    methylPREDNISolone acetate (DEPO-MEDROL) injection 80 mg  2. Sinus headache R51 methylPREDNISolone acetate (DEPO-MEDROL) injection 80 mg  3. Blurry vision, bilateral H51.54     67 year old male presents with nasal allergies, congestion, sinus pain and pressure, followed by double and blurry vision and then headache which she describes as pressure located behind his eyes and in his temples, some radiation to the back of his head neck. Other than double vision, which is odd, no other red flags regarding headache history.  Gradual onset, not severe, no other  associated symptoms with it turning for stroke or bleed. Neurological exam is nonfocal, cranial nerves II through XII intact, no facial asymmetry, no diplopia, no ptosis, EOM's PERRLA, although vision screening decreased from his reported 20/20 distance vision.   He otherwise has sickle exam consistent with URI/rhinosinusitis.  He requested a steroid shot, it had helped his wife just this last week with the same exact symptoms.  We will start treating sinusitis with symptomatic and supportive treatment, will also cover for allergies.  Discussed this case with Dr. Dennard Schaumann, who agrees with symptom of double vision being abnormal and unrelated to a sinus infection or sinus headache.  He will see Mr. Fogel for follow-up.  Did discuss with patient other concerning etiologies of vision changes.  He verbalized understanding that he needs to return immediately to clinic, call us or go to the ER with any worsening symptoms.  This includes but is not limited to worsening vision throughout the day, fatigue with chewing and swallowing that worsens throughout the day.  He understands concerning signs and symptoms for stroke such as slurred speech, asymmetry of face, weakness, he verbalizes understanding of need for emergent evaluation with any symptoms like that.  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  I did give him Augmentin antibiotic printed to hold -only to fill if he has sudden worsening of sinus pain and pressure with fever, or if sinus symptoms are prolonged beyond 10 days.  Delsa Grana, PA-C 09/03/17 4:17 PM

## 2017-09-05 ENCOUNTER — Emergency Department (HOSPITAL_COMMUNITY): Payer: Medicare HMO

## 2017-09-05 ENCOUNTER — Encounter (HOSPITAL_COMMUNITY): Payer: Self-pay | Admitting: Emergency Medicine

## 2017-09-05 ENCOUNTER — Emergency Department (HOSPITAL_COMMUNITY)
Admission: EM | Admit: 2017-09-05 | Discharge: 2017-09-06 | Disposition: A | Discharge status: Home / Self Care | Payer: Medicare HMO | Attending: Emergency Medicine | Admitting: Emergency Medicine

## 2017-09-05 DIAGNOSIS — I6523 Occlusion and stenosis of bilateral carotid arteries: Secondary | ICD-10-CM | POA: Diagnosis not present

## 2017-09-05 DIAGNOSIS — Z79899 Other long term (current) drug therapy: Secondary | ICD-10-CM | POA: Diagnosis not present

## 2017-09-05 DIAGNOSIS — Z87891 Personal history of nicotine dependence: Secondary | ICD-10-CM | POA: Insufficient documentation

## 2017-09-05 DIAGNOSIS — R42 Dizziness and giddiness: Secondary | ICD-10-CM | POA: Diagnosis not present

## 2017-09-05 DIAGNOSIS — R51 Headache: Secondary | ICD-10-CM | POA: Insufficient documentation

## 2017-09-05 DIAGNOSIS — Z7982 Long term (current) use of aspirin: Secondary | ICD-10-CM | POA: Diagnosis not present

## 2017-09-05 DIAGNOSIS — I10 Essential (primary) hypertension: Secondary | ICD-10-CM | POA: Insufficient documentation

## 2017-09-05 DIAGNOSIS — H532 Diplopia: Secondary | ICD-10-CM | POA: Diagnosis not present

## 2017-09-05 DIAGNOSIS — I2510 Atherosclerotic heart disease of native coronary artery without angina pectoris: Secondary | ICD-10-CM | POA: Diagnosis not present

## 2017-09-05 DIAGNOSIS — I252 Old myocardial infarction: Secondary | ICD-10-CM | POA: Diagnosis not present

## 2017-09-05 LAB — BASIC METABOLIC PANEL
ANION GAP: 9 (ref 5–15)
BUN: 17 mg/dL (ref 6–20)
CALCIUM: 9.4 mg/dL (ref 8.9–10.3)
CO2: 26 mmol/L (ref 22–32)
Chloride: 106 mmol/L (ref 101–111)
Creatinine, Ser: 0.76 mg/dL (ref 0.61–1.24)
GFR calc Af Amer: >60 mL/min (ref >60)
GLUCOSE: 99 mg/dL (ref 65–99)
POTASSIUM: 4.2 mmol/L (ref 3.5–5.1)
SODIUM: 141 mmol/L (ref 135–145)

## 2017-09-05 LAB — CBC WITH DIFFERENTIAL/PLATELET
BASOS ABS: 0 10*3/uL (ref 0.0–0.1)
BASOS PCT: 0 %
EOS PCT: 3 %
Eosinophils Absolute: 0.4 10*3/uL (ref 0.0–0.7)
HCT: 48 % (ref 39.0–52.0)
Hemoglobin: 16.4 g/dL (ref 13.0–17.0)
LYMPHS PCT: 21 %
Lymphs Abs: 3 10*3/uL (ref 0.7–4.0)
MCH: 31.4 pg (ref 26.0–34.0)
MCHC: 34.2 g/dL (ref 30.0–36.0)
MCV: 91.8 fL (ref 78.0–100.0)
MONO ABS: 1.5 10*3/uL — AB (ref 0.1–1.0)
Monocytes Relative: 10 %
Neutro Abs: 9.5 10*3/uL — ABNORMAL HIGH (ref 1.7–7.7)
Neutrophils Relative %: 66 %
PLATELETS: 264 10*3/uL (ref 150–400)
RBC: 5.23 MIL/uL (ref 4.22–5.81)
RDW: 13.3 % (ref 11.5–15.5)
WBC: 14.3 10*3/uL — AB (ref 4.0–10.5)

## 2017-09-05 MED ORDER — IOPAMIDOL (ISOVUE-370) INJECTION 76%
100.0000 mL | Freq: Once | INTRAVENOUS | Status: AC | PRN
  Administered 2017-09-05: 100 mL via INTRAVENOUS
  Administered 2017-09-05: 22:00:00 via INTRAVENOUS

## 2017-09-05 MED ORDER — IOPAMIDOL (ISOVUE-370) INJECTION 76%
INTRAVENOUS | Status: AC
  Filled 2017-09-05: qty 100

## 2017-09-05 MED ORDER — METOCLOPRAMIDE HCL 5 MG/ML IJ SOLN
10.0000 mg | Freq: Once | INTRAMUSCULAR | Status: AC
  Administered 2017-09-05: 10 mg via INTRAVENOUS
  Filled 2017-09-05: qty 2

## 2017-09-05 NOTE — ED Notes (Signed)
EKG given to EDP,Allen,MD., for review. 

## 2017-09-05 NOTE — ED Provider Notes (Signed)
[facility] DEPT Provider Note   CSN: [id number] Arrival date & time: 09/05/17  1744     History   Chief Complaint Chief Complaint  Patient presents with  . Headache  . Blurred Vision  . Dizziness    HPI Todd Patterson is a 67 y.o. male.  The history is provided by the patient. No language interpreter was used.  Headache    Dizziness  Associated symptoms: headaches     Todd Patterson is a 67 y.o. male who presents to the Emergency Department complaining of HA, double vision. He reports double vision that began on Monday with associated tearing in his right eye. He saw his PCP two days later who prescribed antibiotics for possible sinus infection. He developed a headache in the posterior neck and frontal head two days ago. Headache is constant gradually worsening. His double vision is constant in nature. It is only present when both eyes are open. If he closes and I he can see without difficulty. He denies any fevers, nausea, vomiting, diarrhea, numbness, weakness. He has a history of coronary artery disease status post stenting 10 years ago, takes a baby aspirin daily.  Past Medical History:  Diagnosis Date  . Colon polyps    colonoscopy 2018, recommended repeat in 1 year due to numbe rof polyps.   . Hyperlipidemia   . Hypertension   . Hyperthyroidism    hyperparthyroid  . Myocardial infarction [facility]) 2009   Has 1 stent    Patient Active Problem List   Diagnosis Date Noted  . Primary hyperparathyroidism ([place]) 07/04/2016  . Hyperlipidemia 04/26/2016  . Essential hypertension 04/26/2016  . CAD (coronary artery disease)     Past Surgical History:  Procedure Laterality Date  . APPENDECTOMY    . CORONARY ANGIOPLASTY WITH STENT PLACEMENT  2009   1 stent  . PARATHYROIDECTOMY N/A 07/04/2016   Procedure: PARATHYROIDECTOMY;  Surgeon: Jackolyn Confer, MD;  Location: [facility] ORS;  Service: General;  Laterality: N/A;  . TONSILLECTOMY          Home  Medications    Prior to Admission medications   Medication Sig Start Date End Date Taking? Authorizing Provider  aspirin EC 325 MG tablet Take 325 mg by mouth daily.   Yes [provider]  atorvastatin (LIPITOR) 80 MG tablet Take 1 tablet (80 mg total) by mouth daily. 01/03/17  Yes [name], MD  carvedilol (COREG) 6.25 MG tablet TAKE 1 TABLET BY MOUTH TWICE A DAY 03/21/17  Yes [name], MD  cetirizine (ZYRTEC) 10 MG tablet Take 1 tablet (10 mg total) by mouth daily. 09/03/17  Yes Delsa Grana, PA-C  fluticasone (FLONASE) 50 MCG/ACT nasal spray Place 2 sprays into both nostrils daily. 09/03/17  Yes Delsa Grana, PA-C  ibuprofen (ADVIL,MOTRIN) 200 MG tablet Take 600 mg by mouth daily as needed for mild pain (pain).    Yes [provider]  losartan (COZAAR) 50 MG tablet Take 1 tablet (50 mg total) by mouth daily. Discontinue lisinopril due to cough and replace with losartan 12/31/16  Yes [name], MD  amoxicillin-clavulanate (AUGMENTIN) 875-125 MG tablet Take 1 tablet by mouth 2 (two) times daily. Patient not taking: Reported on 09/05/2017 09/03/17   Delsa Grana, PA-C  lisinopril (PRINIVIL,ZESTRIL) 20 MG tablet Take 1 tablet (20 mg total) by mouth daily. Patient not taking: Reported on 09/05/2017 03/19/16   [name], MD    Family History Family History  Problem Relation Age of Onset  .  Dementia Mother   . Heart disease Father   . Cancer Sister   . Breast cancer Sister     Social History Social History   Tobacco Use  . Smoking status: Former Smoker    Types: Cigarettes    Last attempt to quit: 03/19/2007    Years since quitting: 10.4  . Smokeless tobacco: Never Used  Substance Use Topics  . Alcohol use: Yes    Alcohol/week: 7.2 - 8.4 oz    Types: 12 - 14 Cans of beer per week  . Drug use: No     Allergies   Lisinopril   Review of Systems Review of Systems  Neurological: Positive for dizziness and headaches.  All other systems  reviewed and are negative.    Physical Exam Updated Vital Signs BP (!) 176/84 (BP Location: Left Arm)   Pulse (!) 57   Temp 98.4 F (36.9 C) (Oral)   Resp 16   Ht 5\' 10"  (1.778 m)   Wt 95 kg (209 lb 6 oz)   SpO2 100%   BMI 30.04 kg/m   Physical Exam  Constitutional: He is oriented to person, place, and time. He appears well-developed and well-nourished.  HENT:  Head: Normocephalic and atraumatic.  Eyes: Pupils are equal, round, and reactive to light. EOM are normal.  Cardiovascular: Normal rate and regular rhythm.  No murmur heard. Pulmonary/Chest: Effort normal and breath sounds normal. No respiratory distress.  Abdominal: Soft. There is no tenderness. There is no rebound and no guarding.  Musculoskeletal: He exhibits no edema or tenderness.  Neurological: He is alert and oriented to person, place, and time. No cranial nerve deficit. Coordination normal.  5/5 strength in all four extremities.  Sensation to light touch intact in all four extremities. No ataxia on finger tenderness bilaterally. No pronated or drift. Visual fields are intact. Normal gait  Skin: Skin is warm and dry.  Psychiatric: He has a normal mood and affect. His behavior is normal.  Nursing note and vitals reviewed.    ED Treatments / Results  Labs (all labs ordered are listed, but only abnormal results are displayed) Labs Reviewed  CBC WITH DIFFERENTIAL/PLATELET - Abnormal; Notable for the following components:      Result Value   WBC 14.3 (*)    Neutro Abs 9.5 (*)    Monocytes Absolute 1.5 (*)    All other components within normal limits  BASIC METABOLIC PANEL    EKG None  Radiology Ct Angio Head W/cm &/or Wo Cm  Result Date: 09/05/2017 CLINICAL DATA:  Initial evaluation for acute headache, neck pain, blurred vision, and dizziness. EXAM: CT ANGIOGRAPHY HEAD AND NECK TECHNIQUE: Multidetector CT imaging of the head and neck was performed using the standard protocol during bolus administration  of intravenous contrast. Multiplanar CT image reconstructions and MIPs were obtained to evaluate the vascular anatomy. Carotid stenosis measurements (when applicable) are obtained utilizing NASCET criteria, using the distal internal carotid diameter as the denominator. CONTRAST:  <See Chart> ISOVUE-370 IOPAMIDOL (ISOVUE-370) INJECTION 76%; 146mL ISOVUE-370 IOPAMIDOL (ISOVUE-370) INJECTION 76% COMPARISON:  None available. FINDINGS: CT HEAD FINDINGS Brain: Cerebral volume within normal limits for age. No acute intracranial hemorrhage. No acute large vessel territory infarct. No mass lesion, midline shift or mass effect. No hydrocephalus. No extra-axial fluid collection. Vascular: No hyperdense vessel. Scattered vascular calcifications noted within the carotid siphons. Skull: Scalp soft tissues and calvarium within normal limits. Sinuses: Paranasal sinuses and mastoid air cells are clear. Orbits: Globes and orbital soft tissues  demonstrate no acute finding. Review of the MIP images confirms the above findings CTA NECK FINDINGS Aortic arch: Visualized aortic arch of normal caliber with normal 3 vessel morphology. Mild to moderate atheromatous plaque about the arch and origin of the great vessels without hemodynamically significant stenosis. Visualized subclavian arteries widely patent without significant stenosis. Right carotid system: Atheromatous irregularity within the right common carotid artery without hemodynamically significant stenosis. A centric calcified plaque at the proximal right ICA with associated stenosis of up to 40-50% by NASCET criteria. Scattered plaque within the right ICA distally without significant stenosis. No evidence for tear ill dissection or occlusion. Left carotid system: Mild scattered plaque within the left common carotid artery without hemodynamically significant stenosis. Mixed plaque about the left bifurcation/proximal left ICA with atheromatous narrowing of up to approximately 40% by  NASCET criteria. Atheromatous irregularity within the left ICA distally without hemodynamically significant stenosis. No vascular occlusion or arterial dissection. Vertebral arteries: Both of the vertebral arteries arise from the subclavian arteries. Left vertebral artery dominant with a diffusely hypoplastic right vertebral artery. Scattered atheromatous plaque within the dominant left V2 and V3 segments without hemodynamically significant stenosis. Diffusely hypoplastic right vertebral artery patent within the neck, but essentially terminates at the skull base, with little contribution to the posterior circulation. No evidence for vertebral artery dissection or occlusion. Skeleton: No acute osseous abnormality. No discrete lytic or blastic osseous lesions. Mild cervical spondylolysis at C5-6 and C6-7. Other neck: No acute soft tissue abnormality within the neck. Upper chest: Unremarkable. Review of the MIP images confirms the above findings CTA HEAD FINDINGS Anterior circulation: Mild scattered atheromatous plaque within the petrous, cavernous, and supraclinoid segments without hemodynamically significant stenosis. ICA termini widely patent. A1 segments, anterior communicating artery common anterior cerebral arteries patent to their distal aspects without stenosis. M1 segments patent without stenosis. Normal MCA bifurcations. No proximal M2 occlusion. Distal MCA branches well perfused and symmetric. Posterior circulation: Dominant left vertebral artery patent to the vertebrobasilar junction without hemodynamically significant stenosis. Patent left PICA. Hypoplastic right vertebral artery largely terminates at the skull base. Right PICA not well visualized. Basilar artery patent to its distal aspect without hemodynamically significant stenosis. Superior cerebral arteries patent bilaterally. Hypoplastic bilateral P1 segments with prominent bilateral posterior communicating arteries. PCAs patent to their distal  aspects without hemodynamically significant stenosis. Venous sinuses: Patent. Anatomic variants: None significant.  No aneurysm. Delayed phase: No abnormal enhancement. Review of the MIP images confirms the above findings IMPRESSION: CT HEAD IMPRESSION Normal head CT for age. No acute intracranial abnormality identified. CTA HEAD AND NECK IMPRESSION 1. Negative CTA for large vessel occlusion. No dissection or other acute arterial vascular abnormality. 2. 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 3. Additional mild to moderate scattered atheromatous change elsewhere within the major arterial vasculature of the head and neck. No other hemodynamically significant or correctable stenosis identified. Electronically Signed   By: Jeannine Boga M.D.   On: 09/05/2017 22:04   Ct Angio Neck W And/or Wo Contrast  Result Date: 09/05/2017 CLINICAL DATA:  Initial evaluation for acute headache, neck pain, blurred vision, and dizziness. EXAM: CT ANGIOGRAPHY HEAD AND NECK TECHNIQUE: Multidetector CT imaging of the head and neck was performed using the standard protocol during bolus administration of intravenous contrast. Multiplanar CT image reconstructions and MIPs were obtained to evaluate the vascular anatomy. Carotid stenosis measurements (when applicable) are obtained utilizing NASCET criteria, using the distal internal carotid diameter as the denominator. CONTRAST:  <See Chart> ISOVUE-370  IOPAMIDOL (ISOVUE-370) INJECTION 76%; 18mL ISOVUE-370 IOPAMIDOL (ISOVUE-370) INJECTION 76% COMPARISON:  None available. FINDINGS: CT HEAD FINDINGS Brain: Cerebral volume within normal limits for age. No acute intracranial hemorrhage. No acute large vessel territory infarct. No mass lesion, midline shift or mass effect. No hydrocephalus. No extra-axial fluid collection. Vascular: No hyperdense vessel. Scattered vascular calcifications noted within the carotid siphons. Skull: Scalp soft  tissues and calvarium within normal limits. Sinuses: Paranasal sinuses and mastoid air cells are clear. Orbits: Globes and orbital soft tissues demonstrate no acute finding. Review of the MIP images confirms the above findings CTA NECK FINDINGS Aortic arch: Visualized aortic arch of normal caliber with normal 3 vessel morphology. Mild to moderate atheromatous plaque about the arch and origin of the great vessels without hemodynamically significant stenosis. Visualized subclavian arteries widely patent without significant stenosis. Right carotid system: Atheromatous irregularity within the right common carotid artery without hemodynamically significant stenosis. A centric calcified plaque at the proximal right ICA with associated stenosis of up to 40-50% by NASCET criteria. Scattered plaque within the right ICA distally without significant stenosis. No evidence for tear ill dissection or occlusion. Left carotid system: Mild scattered plaque within the left common carotid artery without hemodynamically significant stenosis. Mixed plaque about the left bifurcation/proximal left ICA with atheromatous narrowing of up to approximately 40% by NASCET criteria. Atheromatous irregularity within the left ICA distally without hemodynamically significant stenosis. No vascular occlusion or arterial dissection. Vertebral arteries: Both of the vertebral arteries arise from the subclavian arteries. Left vertebral artery dominant with a diffusely hypoplastic right vertebral artery. Scattered atheromatous plaque within the dominant left V2 and V3 segments without hemodynamically significant stenosis. Diffusely hypoplastic right vertebral artery patent within the neck, but essentially terminates at the skull base, with little contribution to the posterior circulation. No evidence for vertebral artery dissection or occlusion. Skeleton: No acute osseous abnormality. No discrete lytic or blastic osseous lesions. Mild cervical  spondylolysis at C5-6 and C6-7. Other neck: No acute soft tissue abnormality within the neck. Upper chest: Unremarkable. Review of the MIP images confirms the above findings CTA HEAD FINDINGS Anterior circulation: Mild scattered atheromatous plaque within the petrous, cavernous, and supraclinoid segments without hemodynamically significant stenosis. ICA termini widely patent. A1 segments, anterior communicating artery common anterior cerebral arteries patent to their distal aspects without stenosis. M1 segments patent without stenosis. Normal MCA bifurcations. No proximal M2 occlusion. Distal MCA branches well perfused and symmetric. Posterior circulation: Dominant left vertebral artery patent to the vertebrobasilar junction without hemodynamically significant stenosis. Patent left PICA. Hypoplastic right vertebral artery largely terminates at the skull base. Right PICA not well visualized. Basilar artery patent to its distal aspect without hemodynamically significant stenosis. Superior cerebral arteries patent bilaterally. Hypoplastic bilateral P1 segments with prominent bilateral posterior communicating arteries. PCAs patent to their distal aspects without hemodynamically significant stenosis. Venous sinuses: Patent. Anatomic variants: None significant.  No aneurysm. Delayed phase: No abnormal enhancement. Review of the MIP images confirms the above findings IMPRESSION: CT HEAD IMPRESSION Normal head CT for age. No acute intracranial abnormality identified. CTA HEAD AND NECK IMPRESSION 1. Negative CTA for large vessel occlusion. No dissection or other acute arterial vascular abnormality. 2. 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 3. Additional mild to moderate scattered atheromatous change elsewhere within the major arterial vasculature of the head and neck. No other hemodynamically significant or correctable stenosis identified. Electronically Signed   By:  Jeannine Boga M.D.   On: 09/05/2017 22:04    Procedures  Procedures (including critical care time)  Medications Ordered in ED Medications  iopamidol (ISOVUE-370) 76 % injection 100 mL ( Intravenous Contrast Given 09/05/17 2131)  metoCLOPramide (REGLAN) injection 10 mg (10 mg Intravenous Given 09/05/17 2237)     Initial Impression / Assessment and Plan / ED Course  I have reviewed the triage vital signs and the nursing notes.  Pertinent labs & imaging results that were available during my care of the patient were reviewed by me and considered in my medical decision making (see chart for details).     Patient here for evaluation of diplopia that is been present since Monday as well as mild headache. In terms of his diplopia this is binocular and worse with distance vision, abates when looking close. Discussed with Neurologist on call, Dr. Cheral Marker. CTA is negative for acute lesions. Plan to discharge home with neurology as well as ophthalmology follow-up. Discussed with patient recommendation to not drive until symptoms resolved.   Final Clinical Impressions(s) / ED Diagnoses   Final diagnoses:  Diplopia    ED Discharge Orders        Ordered    Ambulatory referral to Neurology    Comments:  An appointment is requested in approximately: 1 week   09/05/17 2345       Quintella Reichert, MD 09/06/17 [phone]

## 2017-09-05 NOTE — Discharge Instructions (Addendum)
The cause of your symptoms was not identified today. Continue your current medications as prescribed. Do not drive or use heavy equipment.

## 2017-09-05 NOTE — ED Triage Notes (Signed)
Pt c/o headache, neck pain, blurred vision and dizziness since Monday. Saw PCP on Tuesday and given sinus medications and prescription for antibiotic if worsening sinus pain/pressure to take.  Pt hasnt followed up with opthalmology yet per PCP recommendations.

## 2017-09-06 DIAGNOSIS — H524 Presbyopia: Secondary | ICD-10-CM | POA: Diagnosis not present

## 2017-09-08 DIAGNOSIS — H4921 Sixth [abducent] nerve palsy, right eye: Secondary | ICD-10-CM | POA: Diagnosis not present

## 2017-09-08 DIAGNOSIS — H40003 Preglaucoma, unspecified, bilateral: Secondary | ICD-10-CM | POA: Diagnosis not present

## 2017-09-11 DIAGNOSIS — G43001 Migraine without aura, not intractable, with status migrainosus: Secondary | ICD-10-CM | POA: Diagnosis not present

## 2017-09-11 DIAGNOSIS — M9901 Segmental and somatic dysfunction of cervical region: Secondary | ICD-10-CM | POA: Diagnosis not present

## 2017-09-11 DIAGNOSIS — M5033 Other cervical disc degeneration, cervicothoracic region: Secondary | ICD-10-CM | POA: Diagnosis not present

## 2017-09-11 DIAGNOSIS — M9902 Segmental and somatic dysfunction of thoracic region: Secondary | ICD-10-CM | POA: Diagnosis not present

## 2017-09-13 DIAGNOSIS — M9901 Segmental and somatic dysfunction of cervical region: Secondary | ICD-10-CM | POA: Diagnosis not present

## 2017-09-13 DIAGNOSIS — M5033 Other cervical disc degeneration, cervicothoracic region: Secondary | ICD-10-CM | POA: Diagnosis not present

## 2017-09-13 DIAGNOSIS — M9902 Segmental and somatic dysfunction of thoracic region: Secondary | ICD-10-CM | POA: Diagnosis not present

## 2017-09-13 DIAGNOSIS — G43001 Migraine without aura, not intractable, with status migrainosus: Secondary | ICD-10-CM | POA: Diagnosis not present

## 2017-09-15 ENCOUNTER — Other Ambulatory Visit: Payer: Self-pay | Admitting: Family Medicine

## 2017-09-15 DIAGNOSIS — G43001 Migraine without aura, not intractable, with status migrainosus: Secondary | ICD-10-CM | POA: Diagnosis not present

## 2017-09-15 DIAGNOSIS — M9902 Segmental and somatic dysfunction of thoracic region: Secondary | ICD-10-CM | POA: Diagnosis not present

## 2017-09-15 DIAGNOSIS — M9901 Segmental and somatic dysfunction of cervical region: Secondary | ICD-10-CM | POA: Diagnosis not present

## 2017-09-15 DIAGNOSIS — M5033 Other cervical disc degeneration, cervicothoracic region: Secondary | ICD-10-CM | POA: Diagnosis not present

## 2017-09-16 ENCOUNTER — Encounter: Payer: Self-pay | Admitting: Family Medicine

## 2017-09-16 ENCOUNTER — Ambulatory Visit (INDEPENDENT_AMBULATORY_CARE_PROVIDER_SITE_OTHER): Payer: Medicare HMO | Admitting: Family Medicine

## 2017-09-16 VITALS — BP 150/90 | HR 68 | Temp 97.8°F | Resp 16 | Ht 70.0 in | Wt 205.0 lb

## 2017-09-16 DIAGNOSIS — H532 Diplopia: Secondary | ICD-10-CM

## 2017-09-16 NOTE — Progress Notes (Signed)
Subjective:    Patient ID: Todd Patterson, male    DOB: 11/24/1950, 67 y.o.   MRN: [id number]  HPI Patient is a very pleasant 67 year old white male whose past medical history is significant for myocardial infarction in 2008. Patient underwent PTCA with stenting.  Patient states that he had a bare-metal stent. He was continued on aspirin and Plavix for 2 years and then was instructed to continue aspirin thereafter. He also has a history of hyperlipidemia and hypertension.  2 weeks ago, the patient developed double vision.  If he closes his right eye, the double vision will go away.  If he opens the right eye, the double vision is present.  It is horizontal double vision.  He states that it is constant as long as his right eye is open.  It does not wax and wane throughout the day.  He denies any trouble swallowing.  He denies any muscle weakness.  He denies any pain in his.  He does have some mild headache.  This is diffuse in nature.  It is located mainly in the back of his neck and in his sinus area.  He denies any pain or tenderness over his temporal artery.  He denies any jaw claudication.  He denies any pain in his shoulders or in his hips.  He has no past medical history for migraines.  He denies any symptoms of multiple sclerosis.  On physical exam, the patient is unable to abduct his right eye on lateral gaze to the right.  He is able to abduct his right eye on lateral gaze to the left.  He has normal upward gaze.  He has normal downward gaze.  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 Colon polyps    colonoscopy 2018, recommended repeat in 1 year due to numbe rof polyps.   . Hyperlipidemia   . Hypertension   . Hyperthyroidism    hyperparthyroid  . Myocardial infarction [facility]) 2009   Has 1 stent   Past Surgical History:  Procedure Laterality Date  . APPENDECTOMY    . CORONARY ANGIOPLASTY WITH STENT  PLACEMENT  2009   1 stent  . PARATHYROIDECTOMY N/A 07/04/2016   Procedure: PARATHYROIDECTOMY;  Surgeon: Jackolyn Confer, MD;  Location: [facility];  Service: General;  Laterality: N/A;  . TONSILLECTOMY     Current Outpatient Medications on File Prior to Visit  Medication Sig Dispense Refill  . aspirin EC 325 MG tablet Take 325 mg by mouth daily.    Marland Kitchen atorvastatin (LIPITOR) 80 MG tablet Take 1 tablet (80 mg total) by mouth daily. 90 tablet 3  . carvedilol (COREG) 6.25 MG tablet TAKE 1 TABLET BY MOUTH TWICE A DAY 180 tablet 1  . cetirizine (ZYRTEC) 10 MG tablet Take 1 tablet (10 mg total) by mouth daily. 30 tablet 11  . fluticasone (FLONASE) 50 MCG/ACT nasal spray Place 2 sprays into both nostrils daily. 16 g 6  . ibuprofen (ADVIL,MOTRIN) 200 MG tablet Take 600 mg by mouth daily as needed for mild pain (pain).     Marland Kitchen losartan (COZAAR) 50 MG tablet Take 1 tablet (50 mg total) by mouth daily. Discontinue lisinopril due to cough and replace with losartan 90 tablet 3   Current Facility-Administered Medications on File Prior to Visit  Medication Dose Route Frequency Provider Last Rate Last Dose  . 0.9 %  sodium chloride infusion  500 mL Intravenous Continuous Nandigam,  Venia Minks, MD      . 0.9 %  sodium chloride infusion  500 mL Intravenous Continuous Nandigam, Venia Minks, MD       Allergies  Allergen Reactions  . Lisinopril Cough   Social History   Socioeconomic History  . Marital status: Married    Spouse name: Not on file  . Number of children: Not on file  . Years of education: Not on file  . Highest education level: Not on file  Occupational History  . Not on file  Social Needs  . Financial resource strain: Not on file  . Food insecurity:    Worry: Not on file    Inability: Not on file  . Transportation needs:    Medical: Not on file    Non-medical: Not on file  Tobacco Use  . Smoking status: Former Smoker    Types: Cigarettes    Last attempt to quit: 03/19/2007    Years since  quitting: 10.5  . Smokeless tobacco: Never Used  Substance and Sexual Activity  . Alcohol use: Yes    Alcohol/week: 7.2 - 8.4 oz    Types: 12 - 14 Cans of beer per week  . Drug use: No  . Sexual activity: Yes  Lifestyle  . Physical activity:    Days per week: Not on file    Minutes per session: Not on file  . Stress: Not on file  Relationships  . Social connections:    Talks on phone: Not on file    Gets together: Not on file    Attends religious service: Not on file    Active member of club or organization: Not on file    Attends meetings of clubs or organizations: Not on file    Relationship status: Not on file  . Intimate partner violence:    Fear of current or ex partner: Not on file    Emotionally abused: Not on file    Physically abused: Not on file    Forced sexual activity: Not on file  Other Topics Concern  . Not on file  Social History Narrative  . Not on file   Family History  Problem Relation Age of Onset  . Dementia Mother   . Heart disease Father   . Cancer Sister   . Breast cancer Sister       Review of Systems  All other systems reviewed and are negative.      Objective:   Physical Exam  Constitutional: He is oriented to person, place, and time. He appears well-developed and well-nourished. No distress.  HENT:  Head: Normocephalic and atraumatic.  Right Ear: External ear normal.  Left Ear: External ear normal.  Nose: Nose normal.  Mouth/Throat: Oropharynx is clear and moist. No oropharyngeal exudate.  Eyes: Pupils are equal, round, and reactive to light. Conjunctivae are normal. Right eye exhibits no discharge. Left eye exhibits no discharge. No scleral icterus. Right eye exhibits abnormal extraocular motion. Right eye exhibits no nystagmus. Left eye exhibits normal extraocular motion and no nystagmus.  Neck: Normal range of motion. Neck supple. No JVD present. No tracheal deviation present. No thyromegaly present.  Cardiovascular: Normal rate,  regular rhythm, normal heart sounds and intact distal pulses. Exam reveals no gallop and no friction rub.  No murmur heard. Pulmonary/Chest: Effort normal and breath sounds normal. No stridor. No respiratory distress. He has no wheezes. He has no rales. He exhibits no tenderness.  Abdominal: Soft. Bowel sounds are normal. He exhibits no  distension and no mass. There is no tenderness. There is no rebound and no guarding.  Musculoskeletal: Normal range of motion. He exhibits no edema, tenderness or deformity.  Lymphadenopathy:    He has no cervical adenopathy.  Neurological: He is alert and oriented to person, place, and time. He has normal strength and normal reflexes. He displays no atrophy and no tremor. A cranial nerve deficit is present. No sensory deficit. He exhibits normal muscle tone. He displays a negative Romberg sign. Coordination and gait normal.  Skin: Skin is warm. No rash noted. He is not diaphoretic. No erythema. No pallor.  Psychiatric: He has a normal mood and affect. His behavior is normal. Judgment and thought content normal.  Vitals reviewed.         Assessment & Plan:  Binocular vision disorder with diplopia - Plan: Acetylcholine receptor, blocking Abs, Sedimentation rate, C-reactive protein, TSH, B. burgdorfi antibodies by WB, MR Brain W Wo Contrast  Diplopia - Plan: Acetylcholine receptor, blocking Abs, Sedimentation rate, C-reactive protein, TSH, B. burgdorfi antibodies by WB, MR Brain W Wo Contrast, Ambulatory referral to Neurology  Patient has an isolated 6 cranial nerve palsy/palsy in the lateral rectus muscle.  Differential diagnosis includes idiopathic 6 cranial nerve palsy, internuclear ophthalmoplegia, ischemic insult to the 6 cranial nerve or his nucleus, myasthenia gravis although unlikely based on his exam, temporal arteritis although unlikely based on his exam, Lyme disease with an isolated 6 cranial nerve palsy.  I will begin the work-up by obtaining an MRI  of the brain to rule out ischemic insult to the 6 cranial nerve for his nucleus.  I will consult neurology for second opinion.  I suspect that the patient is either suffered an ischemic insult to the 6 cranial nerve or that this is idiopathic 6 cranial nerve palsy that may resolve spontaneously on its own.  I will screen for myasthenia gravis by checking acetylcholine receptor blocking antibodies, I will check for giant cell arteritis with a sedimentation rate/CRP, I will check a TSH to rule out thyroid abnormalities and also check Lyme serologies.

## 2017-09-17 ENCOUNTER — Encounter (INDEPENDENT_AMBULATORY_CARE_PROVIDER_SITE_OTHER): Payer: Self-pay

## 2017-09-17 DIAGNOSIS — M9901 Segmental and somatic dysfunction of cervical region: Secondary | ICD-10-CM | POA: Diagnosis not present

## 2017-09-17 DIAGNOSIS — M5033 Other cervical disc degeneration, cervicothoracic region: Secondary | ICD-10-CM | POA: Diagnosis not present

## 2017-09-17 DIAGNOSIS — G43001 Migraine without aura, not intractable, with status migrainosus: Secondary | ICD-10-CM | POA: Diagnosis not present

## 2017-09-17 DIAGNOSIS — M9902 Segmental and somatic dysfunction of thoracic region: Secondary | ICD-10-CM | POA: Diagnosis not present

## 2017-09-19 LAB — ACETYLCHOLINE RECEPTOR, BLOCKING: ACHR Blocking Abs: <15 % Inhibition (ref <15)

## 2017-09-21 LAB — B. BURGDORFI ANTIBODIES BY WB
B BURGDORFERI IGG ABS (IB): NEGATIVE
B burgdorferi IgM Abs (IB): NEGATIVE
LYME DISEASE 18 KD IGG: NONREACTIVE
LYME DISEASE 28 KD IGG: NONREACTIVE
LYME DISEASE 41 KD IGG: NONREACTIVE
LYME DISEASE 41 KD IGM: NONREACTIVE
LYME DISEASE 45 KD IGG: REACTIVE — AB
Lyme Disease 23 kD IgG: NONREACTIVE
Lyme Disease 23 kD IgM: NONREACTIVE
Lyme Disease 30 kD IgG: NONREACTIVE
Lyme Disease 39 kD IgG: NONREACTIVE
Lyme Disease 39 kD IgM: NONREACTIVE
Lyme Disease 58 kD IgG: NONREACTIVE
Lyme Disease 66 kD IgG: NONREACTIVE
Lyme Disease 93 kD IgG: NONREACTIVE

## 2017-09-21 LAB — C-REACTIVE PROTEIN: CRP: 0.6 mg/L (ref <8.0)

## 2017-09-21 LAB — SEDIMENTATION RATE: SED RATE: 2 mm/h (ref 0–20)

## 2017-09-21 LAB — TSH: TSH: 1.21 m[IU]/L (ref 0.40–4.50)

## 2017-09-22 ENCOUNTER — Ambulatory Visit: Payer: Medicare HMO | Admitting: Neurology

## 2017-09-22 ENCOUNTER — Telehealth: Payer: Self-pay | Admitting: Neurology

## 2017-09-22 ENCOUNTER — Encounter (INDEPENDENT_AMBULATORY_CARE_PROVIDER_SITE_OTHER): Payer: Self-pay

## 2017-09-22 ENCOUNTER — Encounter: Payer: Self-pay | Admitting: Neurology

## 2017-09-22 VITALS — BP 130/81 | HR 62 | Ht 70.0 in | Wt 205.5 lb

## 2017-09-22 DIAGNOSIS — M9902 Segmental and somatic dysfunction of thoracic region: Secondary | ICD-10-CM | POA: Diagnosis not present

## 2017-09-22 DIAGNOSIS — H4921 Sixth [abducent] nerve palsy, right eye: Secondary | ICD-10-CM | POA: Diagnosis not present

## 2017-09-22 DIAGNOSIS — M5033 Other cervical disc degeneration, cervicothoracic region: Secondary | ICD-10-CM | POA: Diagnosis not present

## 2017-09-22 DIAGNOSIS — M9901 Segmental and somatic dysfunction of cervical region: Secondary | ICD-10-CM | POA: Diagnosis not present

## 2017-09-22 DIAGNOSIS — G43001 Migraine without aura, not intractable, with status migrainosus: Secondary | ICD-10-CM | POA: Diagnosis not present

## 2017-09-22 MED ORDER — CLOPIDOGREL BISULFATE 75 MG PO TABS: 75.0000 mg | ORAL_TABLET | Freq: Every day | ORAL | 11 refills | Status: DC

## 2017-09-22 MED ORDER — ALPRAZOLAM 1 MG PO TABS: ORAL_TABLET | ORAL | 0 refills | Status: DC

## 2017-09-22 NOTE — Progress Notes (Signed)
PATIENT: Todd Patterson DOB: Apr 17, 1950  Chief Complaint  Patient presents with  . New Patient (Initial Visit)    PCP: Dr. Jenna Luo  . Visual Field Change    Patient reports that his diplopia started on 09/01/17.      HISTORICAL  Todd Patterson is a 67 year old male, seen in refer by his primary care doctor Jenna Luo for evaluation of diplopia, initial evaluation was on September 22, 2017,  He has past medical history of hypertension, hyperlipidemia, coronary artery disease, parathyroidectomy for benign parathyroid tumor,  He still works at a golf course, on September 01, 2017, he had sudden onset double vision while driving her car home, he does have double vision side-by-side, which has been persistent since his onset, he denies vertigo, lateralized motor deficit or sensory loss, he denies gait abnormality.  He presented to the emergency room on September 05, 2017, CT angiogram of head and neck showed 40 to 50% stenosis of bilateral carotid artery, additional mild to moderate atherosclerotic disease,  Laboratory evaluations showed normal TSH, C-reactive protein, ESR, negative acetylcholine receptor antibody, mild elevated WBC of 14.3, normal BMP,  REVIEW OF SYSTEMS: Full 14 system review of systems performed and notable only for headaches, double vision, eye pain, cough   ALLERGIES: Allergies  Allergen Reactions  . Lisinopril Cough    HOME MEDICATIONS: Current Outpatient Medications  Medication Sig Dispense Refill  . aspirin EC 325 MG tablet Take 325 mg by mouth daily.    Marland Kitchen atorvastatin (LIPITOR) 80 MG tablet Take 1 tablet (80 mg total) by mouth daily. 90 tablet 3  . carvedilol (COREG) 6.25 MG tablet TAKE 1 TABLET BY MOUTH TWICE A DAY 180 tablet 1  . latanoprost (XALATAN) 0.005 % ophthalmic solution Place 1 drop into both eyes at bedtime.    Marland Kitchen losartan (COZAAR) 50 MG tablet Take 1 tablet (50 mg total) by mouth daily. Discontinue lisinopril due to cough and replace with losartan  (Patient taking differently: Take 50 mg by mouth 2 (two) times daily. Discontinue lisinopril due to cough and replace with losartan) 90 tablet 3   Current Facility-Administered Medications  Medication Dose Route Frequency Provider Last Rate Last Dose  . 0.9 %  sodium chloride infusion  500 mL Intravenous Continuous Nandigam, Kavitha V, MD      . 0.9 %  sodium chloride infusion  500 mL Intravenous Continuous Nandigam, Venia Minks, MD        PAST MEDICAL HISTORY: Past Medical History:  Diagnosis Date  . Colon polyps    colonoscopy 2018, recommended repeat in 1 year due to numbe rof polyps.   . Hyperlipidemia   . Hypertension   . Hyperthyroidism    hyperparthyroid  . Myocardial infarction [facility]) 2009   Has 1 stent    PAST SURGICAL HISTORY: Past Surgical History:  Procedure Laterality Date  . APPENDECTOMY    . CORONARY ANGIOPLASTY WITH STENT PLACEMENT  2009   1 stent  . PARATHYROIDECTOMY N/A 07/04/2016   Procedure: PARATHYROIDECTOMY;  Surgeon: Jackolyn Confer, MD;  Location: [facility];  Service: General;  Laterality: N/A;  . TONSILLECTOMY      FAMILY HISTORY: Family History  Problem Relation Age of Onset  . Dementia Mother   . Heart disease Father   . Cancer Sister   . Breast cancer Sister     SOCIAL HISTORY:  Social History   Socioeconomic History  . Marital status: Married    Spouse name: Not on file  . Number of children:  Not on file  . Years of education: Not on file  . Highest education level: Not on file  Occupational History  . Not on file  Social Needs  . Financial resource strain: Not on file  . Food insecurity:    Worry: Not on file    Inability: Not on file  . Transportation needs:    Medical: Not on file    Non-medical: Not on file  Tobacco Use  . Smoking status: Former Smoker    Types: Cigarettes    Last attempt to quit: 03/19/2007    Years since quitting: 10.5  . Smokeless tobacco: Never Used  Substance and Sexual Activity  . Alcohol use: Yes     Alcohol/week: 7.2 - 8.4 oz    Types: 12 - 14 Cans of beer per week  . Drug use: No  . Sexual activity: Yes  Lifestyle  . Physical activity:    Days per week: Not on file    Minutes per session: Not on file  . Stress: Not on file  Relationships  . Social connections:    Talks on phone: Not on file    Gets together: Not on file    Attends religious service: Not on file    Active member of club or organization: Not on file    Attends meetings of clubs or organizations: Not on file    Relationship status: Not on file  . Intimate partner violence:    Fear of current or ex partner: Not on file    Emotionally abused: Not on file    Physically abused: Not on file    Forced sexual activity: Not on file  Other Topics Concern  . Not on file  Social History Narrative  . Not on file     PHYSICAL EXAM   Vitals:   09/22/17 0836  BP: 130/81  Pulse: 62  Weight: 205 lb 8 oz (93.2 kg)  Height: _0  (1.778 m)    Not recorded      Body mass index is 29.49 kg/m.  PHYSICAL EXAMNIATION:  Gen: NAD, conversant, well nourised, obese, well groomed                     Cardiovascular: Regular rate rhythm, no peripheral edema, warm, nontender. Eyes: Conjunctivae clear without exudates or hemorrhage Neck: Supple, no carotid bruits. Pulmonary: Clear to auscultation bilaterally   NEUROLOGICAL EXAM:  MENTAL STATUS: Speech:    Speech is normal; fluent and spontaneous with normal comprehension.  Cognition:     Orientation to time, place and person     Normal recent and remote memory     Normal Attention span and concentration     Normal Language, naming, repeating,spontaneous speech     Fund of knowledge   CRANIAL NERVES: CN II: Visual fields are full to confrontation. Fundoscopic exam is normal with sharp discs and no vascular changes. Pupils are round equal and briskly reactive to light. CN III, IV, VI: No ptosis, right lateral gaze palsy, CN V: Facial sensation is intact to  pinprick in all 3 divisions bilaterally. Corneal responses are intact.  CN VII: Face is symmetric with normal eye closure and smile. CN VIII: Hearing is normal to rubbing fingers CN IX, X: Palate elevates symmetrically. Phonation is normal. CN XI: Head turning and shoulder shrug are intact CN XII: Tongue is midline with normal movements and no atrophy.  MOTOR: There is no pronator drift of out-stretched arms. Muscle bulk and tone  are normal. Muscle strength is normal.  REFLEXES: Reflexes are 2+ and symmetric at the biceps, triceps, knees, and ankles. Plantar responses are flexor.  SENSORY: Intact to light touch, pinprick, positional sensation and vibratory sensation are intact in fingers and toes.  COORDINATION: Rapid alternating movements and fine finger movements are intact. There is no dysmetria on finger-to-nose and heel-knee-shin.    GAIT/STANCE: Posture is normal. Gait is steady with normal steps, base, arm swing, and turning. Heel and toe walking are normal. Tandem gait is normal.  Romberg is absent.   DIAGNOSTIC DATA (LABS, IMAGING, TESTING) - I reviewed patient records, labs, notes, testing and imaging myself where available.   ASSESSMENT AND PLAN  Todd Patterson is a 67 y.o. 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 Ph.D.  [facility] [address] [place], [place] [address] Ph: [phone] Fax: [phone]  CC: [name], MD

## 2017-09-22 NOTE — Addendum Note (Signed)
Addended by: Noberto Retort C on: 09/22/2017 11:17 AM   Modules accepted: Orders

## 2017-09-22 NOTE — Telephone Encounter (Signed)
Pt has called re: his MRI on tomorrow.  Pt would like to have something called in for him since he is claustrophobic  Please call it into  [id number] [place], [place] - [address] [phone] (Phone) [phone] (Fax)   Pt would also like a call from RN to discuss what will be called in for him.

## 2017-09-22 NOTE — Telephone Encounter (Signed)
MRI protocol - Xanax 1mg . Take 1-2 tablets thirty minutes prior to MRI.  May repeat with 1 additional tablet prior to entering scanner, if needed.  Must have driver.

## 2017-09-22 NOTE — Addendum Note (Signed)
Addended by: [name] on: 09/22/2017 12:36 PM   Modules accepted: Orders

## 2017-09-23 ENCOUNTER — Ambulatory Visit
Admission: RE | Admit: 2017-09-23 | Discharge: 2017-09-23 | Disposition: A | Discharge status: Home / Self Care | Payer: Medicare HMO | Source: Ambulatory Visit | Attending: Family Medicine | Admitting: Family Medicine

## 2017-09-23 DIAGNOSIS — H532 Diplopia: Secondary | ICD-10-CM

## 2017-09-23 MED ORDER — GADOBENATE DIMEGLUMINE 529 MG/ML IV SOLN
20.0000 mL | Freq: Once | INTRAVENOUS | Status: AC | PRN
  Administered 2017-09-23: 20 mL via INTRAVENOUS

## 2017-09-24 DIAGNOSIS — M9901 Segmental and somatic dysfunction of cervical region: Secondary | ICD-10-CM | POA: Diagnosis not present

## 2017-09-24 DIAGNOSIS — M5033 Other cervical disc degeneration, cervicothoracic region: Secondary | ICD-10-CM | POA: Diagnosis not present

## 2017-09-24 DIAGNOSIS — M9902 Segmental and somatic dysfunction of thoracic region: Secondary | ICD-10-CM | POA: Diagnosis not present

## 2017-09-24 DIAGNOSIS — G43001 Migraine without aura, not intractable, with status migrainosus: Secondary | ICD-10-CM | POA: Diagnosis not present

## 2017-09-25 ENCOUNTER — Encounter (INDEPENDENT_AMBULATORY_CARE_PROVIDER_SITE_OTHER): Payer: Self-pay

## 2017-09-25 ENCOUNTER — Telehealth: Payer: Self-pay | Admitting: Neurology

## 2017-09-25 NOTE — Telephone Encounter (Signed)
Pt request MRI results. He is aware he will receive a call when it has been read.

## 2017-09-25 NOTE — Telephone Encounter (Signed)
Spoke to patient he is aware of results.

## 2017-09-25 NOTE — Telephone Encounter (Signed)
Please call patient, MRI of brain showed no significant abnormality.  No acute abnormality.  Mild chronic white matter changes, less than expected for age.

## 2017-09-26 DIAGNOSIS — G43001 Migraine without aura, not intractable, with status migrainosus: Secondary | ICD-10-CM | POA: Diagnosis not present

## 2017-09-26 DIAGNOSIS — M5033 Other cervical disc degeneration, cervicothoracic region: Secondary | ICD-10-CM | POA: Diagnosis not present

## 2017-09-26 DIAGNOSIS — M9901 Segmental and somatic dysfunction of cervical region: Secondary | ICD-10-CM | POA: Diagnosis not present

## 2017-09-26 DIAGNOSIS — M9902 Segmental and somatic dysfunction of thoracic region: Secondary | ICD-10-CM | POA: Diagnosis not present

## 2017-09-29 DIAGNOSIS — M9902 Segmental and somatic dysfunction of thoracic region: Secondary | ICD-10-CM | POA: Diagnosis not present

## 2017-09-29 DIAGNOSIS — G43001 Migraine without aura, not intractable, with status migrainosus: Secondary | ICD-10-CM | POA: Diagnosis not present

## 2017-09-29 DIAGNOSIS — M5033 Other cervical disc degeneration, cervicothoracic region: Secondary | ICD-10-CM | POA: Diagnosis not present

## 2017-09-29 DIAGNOSIS — M9901 Segmental and somatic dysfunction of cervical region: Secondary | ICD-10-CM | POA: Diagnosis not present

## 2017-10-01 DIAGNOSIS — M9902 Segmental and somatic dysfunction of thoracic region: Secondary | ICD-10-CM | POA: Diagnosis not present

## 2017-10-01 DIAGNOSIS — M9901 Segmental and somatic dysfunction of cervical region: Secondary | ICD-10-CM | POA: Diagnosis not present

## 2017-10-01 DIAGNOSIS — G43001 Migraine without aura, not intractable, with status migrainosus: Secondary | ICD-10-CM | POA: Diagnosis not present

## 2017-10-01 DIAGNOSIS — M5033 Other cervical disc degeneration, cervicothoracic region: Secondary | ICD-10-CM | POA: Diagnosis not present

## 2017-10-03 DIAGNOSIS — M9902 Segmental and somatic dysfunction of thoracic region: Secondary | ICD-10-CM | POA: Diagnosis not present

## 2017-10-03 DIAGNOSIS — M9901 Segmental and somatic dysfunction of cervical region: Secondary | ICD-10-CM | POA: Diagnosis not present

## 2017-10-03 DIAGNOSIS — M5033 Other cervical disc degeneration, cervicothoracic region: Secondary | ICD-10-CM | POA: Diagnosis not present

## 2017-10-03 DIAGNOSIS — G43001 Migraine without aura, not intractable, with status migrainosus: Secondary | ICD-10-CM | POA: Diagnosis not present

## 2017-10-15 DIAGNOSIS — M9901 Segmental and somatic dysfunction of cervical region: Secondary | ICD-10-CM | POA: Diagnosis not present

## 2017-10-15 DIAGNOSIS — M5033 Other cervical disc degeneration, cervicothoracic region: Secondary | ICD-10-CM | POA: Diagnosis not present

## 2017-10-15 DIAGNOSIS — G43001 Migraine without aura, not intractable, with status migrainosus: Secondary | ICD-10-CM | POA: Diagnosis not present

## 2017-10-15 DIAGNOSIS — M9902 Segmental and somatic dysfunction of thoracic region: Secondary | ICD-10-CM | POA: Diagnosis not present

## 2017-10-16 DIAGNOSIS — M5033 Other cervical disc degeneration, cervicothoracic region: Secondary | ICD-10-CM | POA: Diagnosis not present

## 2017-10-16 DIAGNOSIS — M9902 Segmental and somatic dysfunction of thoracic region: Secondary | ICD-10-CM | POA: Diagnosis not present

## 2017-10-16 DIAGNOSIS — G43001 Migraine without aura, not intractable, with status migrainosus: Secondary | ICD-10-CM | POA: Diagnosis not present

## 2017-10-16 DIAGNOSIS — M9901 Segmental and somatic dysfunction of cervical region: Secondary | ICD-10-CM | POA: Diagnosis not present

## 2017-10-17 DIAGNOSIS — M5033 Other cervical disc degeneration, cervicothoracic region: Secondary | ICD-10-CM | POA: Diagnosis not present

## 2017-10-17 DIAGNOSIS — M9902 Segmental and somatic dysfunction of thoracic region: Secondary | ICD-10-CM | POA: Diagnosis not present

## 2017-10-17 DIAGNOSIS — G43001 Migraine without aura, not intractable, with status migrainosus: Secondary | ICD-10-CM | POA: Diagnosis not present

## 2017-10-17 DIAGNOSIS — M9901 Segmental and somatic dysfunction of cervical region: Secondary | ICD-10-CM | POA: Diagnosis not present

## 2017-10-21 DIAGNOSIS — M5033 Other cervical disc degeneration, cervicothoracic region: Secondary | ICD-10-CM | POA: Diagnosis not present

## 2017-10-21 DIAGNOSIS — M9902 Segmental and somatic dysfunction of thoracic region: Secondary | ICD-10-CM | POA: Diagnosis not present

## 2017-10-21 DIAGNOSIS — M9901 Segmental and somatic dysfunction of cervical region: Secondary | ICD-10-CM | POA: Diagnosis not present

## 2017-10-21 DIAGNOSIS — G43001 Migraine without aura, not intractable, with status migrainosus: Secondary | ICD-10-CM | POA: Diagnosis not present

## 2017-10-23 DIAGNOSIS — M5033 Other cervical disc degeneration, cervicothoracic region: Secondary | ICD-10-CM | POA: Diagnosis not present

## 2017-10-23 DIAGNOSIS — M9901 Segmental and somatic dysfunction of cervical region: Secondary | ICD-10-CM | POA: Diagnosis not present

## 2017-10-23 DIAGNOSIS — G43001 Migraine without aura, not intractable, with status migrainosus: Secondary | ICD-10-CM | POA: Diagnosis not present

## 2017-10-23 DIAGNOSIS — M9902 Segmental and somatic dysfunction of thoracic region: Secondary | ICD-10-CM | POA: Diagnosis not present

## 2017-10-27 DIAGNOSIS — H40003 Preglaucoma, unspecified, bilateral: Secondary | ICD-10-CM | POA: Diagnosis not present

## 2017-10-28 DIAGNOSIS — G43001 Migraine without aura, not intractable, with status migrainosus: Secondary | ICD-10-CM | POA: Diagnosis not present

## 2017-10-28 DIAGNOSIS — M5033 Other cervical disc degeneration, cervicothoracic region: Secondary | ICD-10-CM | POA: Diagnosis not present

## 2017-10-28 DIAGNOSIS — M9902 Segmental and somatic dysfunction of thoracic region: Secondary | ICD-10-CM | POA: Diagnosis not present

## 2017-10-28 DIAGNOSIS — M9901 Segmental and somatic dysfunction of cervical region: Secondary | ICD-10-CM | POA: Diagnosis not present

## 2017-11-11 DIAGNOSIS — G43001 Migraine without aura, not intractable, with status migrainosus: Secondary | ICD-10-CM | POA: Diagnosis not present

## 2017-11-11 DIAGNOSIS — M9901 Segmental and somatic dysfunction of cervical region: Secondary | ICD-10-CM | POA: Diagnosis not present

## 2017-11-11 DIAGNOSIS — M5033 Other cervical disc degeneration, cervicothoracic region: Secondary | ICD-10-CM | POA: Diagnosis not present

## 2017-11-11 DIAGNOSIS — M9902 Segmental and somatic dysfunction of thoracic region: Secondary | ICD-10-CM | POA: Diagnosis not present

## 2017-12-01 ENCOUNTER — Encounter: Payer: Self-pay | Admitting: Family Medicine

## 2017-12-01 ENCOUNTER — Ambulatory Visit (INDEPENDENT_AMBULATORY_CARE_PROVIDER_SITE_OTHER): Payer: Medicare HMO | Admitting: Family Medicine

## 2017-12-01 VITALS — BP 130/82 | HR 60 | Temp 98.1°F | Resp 16 | Ht 70.0 in | Wt 212.0 lb

## 2017-12-01 DIAGNOSIS — H532 Diplopia: Secondary | ICD-10-CM | POA: Diagnosis not present

## 2017-12-01 DIAGNOSIS — Z23 Encounter for immunization: Secondary | ICD-10-CM | POA: Diagnosis not present

## 2017-12-01 DIAGNOSIS — R69 Illness, unspecified: Secondary | ICD-10-CM | POA: Diagnosis not present

## 2017-12-01 NOTE — Progress Notes (Signed)
Subjective:    Patient ID: Todd Patterson, male    DOB: Jul 20, 1950, 67 y.o.   MRN: [id number]  HPI  09/16/17 Patient is a very pleasant 67 year old white male whose past medical history is significant for myocardial infarction in 2008. Patient underwent PTCA with stenting.  Patient states that he had a bare-metal stent. He was continued on aspirin and Plavix for 2 years and then was instructed to continue aspirin thereafter. He also has a history of hyperlipidemia and hypertension.  2 weeks ago, the patient developed double vision.  If he closes his right eye, the double vision will go away.  If he opens the right eye, the double vision is present.  It is horizontal double vision.  He states that it is constant as long as his right eye is open.  It does not wax and wane throughout the day.  He denies any trouble swallowing.  He denies any muscle weakness.  He denies any pain in his.  He does have some mild headache.  This is diffuse in nature.  It is located mainly in the back of his neck and in his sinus area.  He denies any pain or tenderness over his temporal artery.  He denies any jaw claudication.  He denies any pain in his shoulders or in his hips.  He has no past medical history for migraines.  He denies any symptoms of multiple sclerosis.  On physical exam, the patient is unable to abduct his right eye on lateral gaze to the right.  He is able to abduct his right eye on lateral gaze to the left.  He has normal upward gaze.  He has normal downward gaze.  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  Differential diagnosis includes idiopathic 6 cranial nerve palsy, internuclear ophthalmoplegia, ischemic insult to the 6 cranial nerve or its nucleus, myasthenia gravis although unlikely based on his exam, temporal arteritis although unlikely based on  his exam, Lyme disease with an isolated 6 cranial nerve palsy.  I will begin the work-up by obtaining an MRI of the brain to rule out ischemic insult to the 6 cranial nerve for his nucleus.  I will consult neurology for second opinion.  I suspect that the patient is either suffered an ischemic insult to the 6 cranial nerve or that this is idiopathic 6 cranial nerve palsy that may resolve spontaneously on its own.  I will screen for myasthenia gravis by checking acetylcholine receptor blocking antibodies, I will check for giant cell arteritis with a sedimentation rate/CRP, I will check a TSH to rule out thyroid abnormalities and also check Lyme serologies.    12/01/17 Patient has done very well since I last saw him.  He is recovered the vast majority of his lateral gaze in his right eye.  Today on exam, extraocular movements are intact.  There is no obvious palsy in his right eye with abduction.  MRI revealed chronic white matter changes but no obvious stroke.  The remainder of his lab work showed no evidence of myasthenia gravis or Lyme disease.  Neurology recommended switching aspirin to Plavix 75 mg a day in case this was ischemic in origin.  He is already on maximum dose statin and his blood pressure is well controlled at 130/82 Past Medical History:  Diagnosis Date  . Colon polyps  colonoscopy 2018, recommended repeat in 1 year due to numbe rof polyps.   . Hyperlipidemia   . Hypertension   . Hyperthyroidism    hyperparthyroid  . Myocardial infarction [facility]) 2009   Has 1 stent   Past Surgical History:  Procedure Laterality Date  . APPENDECTOMY    . CORONARY ANGIOPLASTY WITH STENT PLACEMENT  2009   1 stent  . PARATHYROIDECTOMY N/A 07/04/2016   Procedure: PARATHYROIDECTOMY;  Surgeon: Jackolyn Confer, MD;  Location: [facility] ORS;  Service: General;  Laterality: N/A;  . TONSILLECTOMY     Current Outpatient Medications on File Prior to Visit  Medication Sig Dispense Refill  . ALPRAZolam (XANAX) 1 MG  tablet Take 1-2 tablets thirty minutes prior to MRI.  May take one additional tablet before entering scanner, if needed.  MUST HAVE DRIVER. 3 tablet 0  . aspirin EC 325 MG tablet Take 325 mg by mouth daily.    Marland Kitchen atorvastatin (LIPITOR) 80 MG tablet Take 1 tablet (80 mg total) by mouth daily. 90 tablet 3  . carvedilol (COREG) 6.25 MG tablet TAKE 1 TABLET BY MOUTH TWICE A DAY 180 tablet 1  . clopidogrel (PLAVIX) 75 MG tablet Take 1 tablet (75 mg total) by mouth daily. 30 tablet 11  . latanoprost (XALATAN) 0.005 % ophthalmic solution Place 1 drop into both eyes at bedtime.    Marland Kitchen losartan (COZAAR) 50 MG tablet Take 1 tablet (50 mg total) by mouth daily. Discontinue lisinopril due to cough and replace with losartan (Patient taking differently: Take 50 mg by mouth 2 (two) times daily. Discontinue lisinopril due to cough and replace with losartan) 90 tablet 3   Current Facility-Administered Medications on File Prior to Visit  Medication Dose Route Frequency Provider Last Rate Last Dose  . 0.9 %  sodium chloride infusion  500 mL Intravenous Continuous Nandigam, Kavitha V, MD      . 0.9 %  sodium chloride infusion  500 mL Intravenous Continuous Nandigam, Venia Minks, MD       Allergies  Allergen Reactions  . Lisinopril Cough   Social History   Socioeconomic History  . Marital status: Married    Spouse name: Not on file  . Number of children: Not on file  . Years of education: Not on file  . Highest education level: Not on file  Occupational History  . Not on file  Social Needs  . Financial resource strain: Not on file  . Food insecurity:    Worry: Not on file    Inability: Not on file  . Transportation needs:    Medical: Not on file    Non-medical: Not on file  Tobacco Use  . Smoking status: Former Smoker    Types: Cigarettes    Last attempt to quit: 03/19/2007    Years since quitting: 10.7  . Smokeless tobacco: Never Used  Substance and Sexual Activity  . Alcohol use: Yes     Alcohol/week: 12.0 - 14.0 standard drinks    Types: 12 - 14 Cans of beer per week  . Drug use: No  . Sexual activity: Yes  Lifestyle  . Physical activity:    Days per week: Not on file    Minutes per session: Not on file  . Stress: Not on file  Relationships  . Social connections:    Talks on phone: Not on file    Gets together: Not on file    Attends religious service: Not on file    Active member of club  or organization: Not on file    Attends meetings of clubs or organizations: Not on file    Relationship status: Not on file  . Intimate partner violence:    Fear of current or ex partner: Not on file    Emotionally abused: Not on file    Physically abused: Not on file    Forced sexual activity: Not on file  Other Topics Concern  . Not on file  Social History Narrative  . Not on file   Family History  Problem Relation Age of Onset  . Dementia Mother   . Heart disease Father   . Cancer Sister   . Breast cancer Sister       Review of Systems  All other systems reviewed and are negative.      Objective:   Physical Exam  Constitutional: He is oriented to person, place, and time. He appears well-developed and well-nourished. No distress.  Eyes: Pupils are equal, round, and reactive to light. Conjunctivae and EOM are normal. Right eye exhibits normal extraocular motion and no nystagmus. Left eye exhibits normal extraocular motion and no nystagmus.  Cardiovascular: Normal rate, regular rhythm, normal heart sounds and intact distal pulses. Exam reveals no gallop and no friction rub.  No murmur heard. Pulmonary/Chest: Effort normal and breath sounds normal. No respiratory distress. He has no wheezes. He has no rales. He exhibits no tenderness.  Abdominal: Soft. Bowel sounds are normal. He exhibits no distension and no mass. There is no tenderness. There is no rebound and no guarding.  Neurological: He is alert and oriented to person, place, and time. He has normal strength  and normal reflexes. He displays no atrophy and no tremor. No cranial nerve deficit or sensory deficit. He exhibits normal muscle tone. He displays a negative Romberg sign. Coordination and gait normal.  Skin: He is not diaphoretic.  Vitals reviewed.         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  Patient has recovered virtually 100%.  No further treatment is necessary at this time.  Recommended continuing high-dose statin to treat the patient as though this were an ischemic event as I see no downside to that strategy given his previous history of cardiovascular disease.  Continue Plavix 75 mg daily and focus on blood pressure control.  Patient states that he recently had a CBC, CMP, and a fasting lipid panel at his neurologist office.  I will sign a release of information form to try to get that information from them to ensure adequate treatment of his cholesterol.  His goal LDL cholesterol is less than 70.  Also want to monitor his liver function test

## 2017-12-02 ENCOUNTER — Ambulatory Visit: Payer: Medicare HMO | Admitting: Neurology

## 2017-12-02 DIAGNOSIS — M5033 Other cervical disc degeneration, cervicothoracic region: Secondary | ICD-10-CM | POA: Diagnosis not present

## 2017-12-02 DIAGNOSIS — M9902 Segmental and somatic dysfunction of thoracic region: Secondary | ICD-10-CM | POA: Diagnosis not present

## 2017-12-02 DIAGNOSIS — G43001 Migraine without aura, not intractable, with status migrainosus: Secondary | ICD-10-CM | POA: Diagnosis not present

## 2017-12-02 DIAGNOSIS — M9901 Segmental and somatic dysfunction of cervical region: Secondary | ICD-10-CM | POA: Diagnosis not present

## 2017-12-18 ENCOUNTER — Other Ambulatory Visit: Payer: Self-pay | Admitting: Family Medicine

## 2017-12-30 DIAGNOSIS — M9901 Segmental and somatic dysfunction of cervical region: Secondary | ICD-10-CM | POA: Diagnosis not present

## 2017-12-30 DIAGNOSIS — M9902 Segmental and somatic dysfunction of thoracic region: Secondary | ICD-10-CM | POA: Diagnosis not present

## 2017-12-30 DIAGNOSIS — G43001 Migraine without aura, not intractable, with status migrainosus: Secondary | ICD-10-CM | POA: Diagnosis not present

## 2017-12-30 DIAGNOSIS — M5033 Other cervical disc degeneration, cervicothoracic region: Secondary | ICD-10-CM | POA: Diagnosis not present

## 2018-01-05 DIAGNOSIS — H40003 Preglaucoma, unspecified, bilateral: Secondary | ICD-10-CM | POA: Diagnosis not present

## 2018-01-27 DIAGNOSIS — M5033 Other cervical disc degeneration, cervicothoracic region: Secondary | ICD-10-CM | POA: Diagnosis not present

## 2018-01-27 DIAGNOSIS — M9901 Segmental and somatic dysfunction of cervical region: Secondary | ICD-10-CM | POA: Diagnosis not present

## 2018-01-27 DIAGNOSIS — G43001 Migraine without aura, not intractable, with status migrainosus: Secondary | ICD-10-CM | POA: Diagnosis not present

## 2018-01-27 DIAGNOSIS — M9902 Segmental and somatic dysfunction of thoracic region: Secondary | ICD-10-CM | POA: Diagnosis not present

## 2018-02-15 ENCOUNTER — Other Ambulatory Visit: Payer: Self-pay | Admitting: Family Medicine

## 2018-02-20 ENCOUNTER — Ambulatory Visit (INDEPENDENT_AMBULATORY_CARE_PROVIDER_SITE_OTHER): Payer: Medicare HMO | Admitting: Family Medicine

## 2018-02-20 ENCOUNTER — Encounter: Payer: Self-pay | Admitting: Family Medicine

## 2018-02-20 VITALS — BP 120/84 | HR 60 | Temp 98.2°F | Resp 16 | Ht 70.0 in | Wt 217.5 lb

## 2018-02-20 DIAGNOSIS — J069 Acute upper respiratory infection, unspecified: Secondary | ICD-10-CM | POA: Diagnosis not present

## 2018-02-20 MED ORDER — AZITHROMYCIN 250 MG PO TABS: ORAL_TABLET | ORAL | 0 refills | Status: DC

## 2018-02-20 MED ORDER — PREDNISONE 20 MG PO TABS: 40.0000 mg | ORAL_TABLET | Freq: Every day | ORAL | 0 refills | Status: AC

## 2018-02-20 MED ORDER — HYDROCODONE-HOMATROPINE 5-1.5 MG/5ML PO SYRP: 5.0000 mL | ORAL_SOLUTION | Freq: Three times a day (TID) | ORAL | 0 refills | Status: DC | PRN

## 2018-02-20 MED ORDER — BENZONATATE 100 MG PO CAPS: 100.0000 mg | ORAL_CAPSULE | Freq: Three times a day (TID) | ORAL | 0 refills | Status: DC | PRN

## 2018-02-20 NOTE — Addendum Note (Signed)
Addended by: Delsa Grana on: 02/20/2018 02:36 PM   Modules accepted: Orders

## 2018-02-20 NOTE — Progress Notes (Signed)
Patient ID: Todd Patterson, male    DOB: 11-13-1950, 67 y.o.   MRN: [id number]  PCP: [name], MD  Chief Complaint  Patient presents with  . Cough    onset 5 days ago. Has productive cough.     Subjective:   Todd Patterson is a 67 y.o. male, presents to clinic with CC of URI sx and productive cough x 5 days.  It started with a lot of nasal congestion, postnasal drip and sore throat, then developed into a "chest cold".  Sputum is clear to yellow or sometimes gray, he notes some discomfort in his ribs and abdomen described as soreness from coughing but he denies any chest pain, chest tightness, wheeze or shortness of breath.  His wife is concerned that at night his cough does sound wheezy.  He states that was a few days ago and has not happened in the past 2 to 3 days.  He denies any palpitations, orthopnea, PND, weight gain, lower extremity edema, near syncope.  He quit smoking 10 years ago but did smoke for around 40 years, he is denies any history of lung disease, asthma or recurrent or frequent bronchitis.  He denies any seasonal allergies.  He has not had any fever, sweats, chills, nausea, decreased appetite, fatigue, night sweats.  Has not tried any over-the-counter treatments.  He did put a humidifier next to his bed for a few days but it did not change any of his symptoms.  He noticed that his cough is worse throughout the day, also worse at night and also worse first thing in the morning but he is able to get some of the postnasal drip and congestion coughed up when he first wakes up.   Patient Active Problem List   Diagnosis Date Noted  . 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 Primary hyperparathyroidism ([place]) 07/04/2016  . Hyperlipidemia 04/26/2016  . Essential hypertension 04/26/2016  . CAD (coronary artery disease)      Prior to Admission medications   Medication Sig Start Date End Date Taking? Authorizing Provider  ALPRAZolam Duanne Moron) 1 MG tablet Take 1-2 tablets thirty  minutes prior to MRI.  May take one additional tablet before entering scanner, if needed.  MUST HAVE DRIVER. 04/22/34  Yes [name], MD  atorvastatin (LIPITOR) 80 MG tablet TAKE 1 TABLET BY MOUTH EVERY DAY 02/16/18  Yes [name], MD  carvedilol (COREG) 6.25 MG tablet TAKE 1 TABLET BY MOUTH TWICE A DAY 09/15/17  Yes [name], MD  clopidogrel (PLAVIX) 75 MG tablet Take 1 tablet (75 mg total) by mouth daily. 09/22/17  Yes [name], MD  latanoprost (XALATAN) 0.005 % ophthalmic solution Place 1 drop into both eyes at bedtime.   Yes [provider]  losartan (COZAAR) 50 MG tablet Take 1 tablet (50 mg total) by mouth daily. 12/18/17  Yes [name], MD     Allergies  Allergen Reactions  . Lisinopril Cough     Family History  Problem Relation Age of Onset  . Dementia Mother   . Heart disease Father   . Cancer Sister   . Breast cancer Sister      Social History   Socioeconomic History  . Marital status: Married    Spouse name: Not on file  . Number of children: Not on file  . Years of education: Not on file  . Highest education level: Not on file  Occupational History  . Not on file  Social  Needs  . Financial resource strain: Not on file  . Food insecurity:    Worry: Not on file    Inability: Not on file  . Transportation needs:    Medical: Not on file    Non-medical: Not on file  Tobacco Use  . Smoking status: Former Smoker    Types: Cigarettes    Last attempt to quit: 03/19/2007    Years since quitting: 10.9  . Smokeless tobacco: Never Used  Substance and Sexual Activity  . Alcohol use: Yes    Alcohol/week: 12.0 - 14.0 standard drinks    Types: 12 - 14 Cans of beer per week  . Drug use: No  . Sexual activity: Yes  Lifestyle  . Physical activity:    Days per week: Not on file    Minutes per session: Not on file  . Stress: Not on file  Relationships  . Social connections:    Talks on phone: Not on file    Gets together: Not on file     Attends religious service: Not on file    Active member of club or organization: Not on file    Attends meetings of clubs or organizations: Not on file    Relationship status: Not on file  . Intimate partner violence:    Fear of current or ex partner: Not on file    Emotionally abused: Not on file    Physically abused: Not on file    Forced sexual activity: Not on file  Other Topics Concern  . Not on file  Social History Narrative  . Not on file     Review of Systems  Constitutional: Negative.   HENT: Negative.   Eyes: Negative.   Respiratory: Negative.   Cardiovascular: Negative.   Gastrointestinal: Negative.   Endocrine: Negative.   Genitourinary: Negative.   Musculoskeletal: Negative.   Skin: Negative.   Allergic/Immunologic: Negative.   Neurological: Negative.   Hematological: Negative.   Psychiatric/Behavioral: Negative.   All other systems reviewed and are negative.      Objective:    Vitals:   02/20/18 1030  BP: 120/84  Pulse: 60  Resp: 16  Temp: 98.2 F (36.8 C)  TempSrc: Oral  SpO2: 95%  Weight: 217 lb 8 oz (98.7 kg)  Height: 5\' 10"  (1.778 m)      Physical Exam  Constitutional: He appears well-developed and well-nourished. He is cooperative.  Non-toxic appearance. He does not have a sickly appearance. No distress.  HENT:  Head: Normocephalic and atraumatic.  Right Ear: Tympanic membrane, external ear and ear canal normal.  Left Ear: Tympanic membrane, external ear and ear canal normal.  Nose: Mucosal edema and rhinorrhea present. Right sinus exhibits no maxillary sinus tenderness and no frontal sinus tenderness. Left sinus exhibits no maxillary sinus tenderness and no frontal sinus tenderness.  Mouth/Throat: Uvula is midline and mucous membranes are normal. Mucous membranes are not pale, not dry and not cyanotic. No trismus in the jaw. No uvula swelling. Posterior oropharyngeal erythema present. No oropharyngeal exudate, posterior oropharyngeal  edema or tonsillar abscesses. No tonsillar exudate.  Posterior oropharynx moderately erythematous without exudate, uvula of prominent size, post-nasal drip noted  Eyes: Pupils are equal, round, and reactive to light. Conjunctivae, EOM and lids are normal. Right eye exhibits no discharge. Left eye exhibits no discharge.  Neck: Trachea normal, normal range of motion and phonation normal. Neck supple. No tracheal deviation present.  Cardiovascular: Normal rate, regular rhythm, normal heart sounds and intact distal pulses.  Exam reveals no gallop and no friction rub.  No murmur heard. Pulses:      Radial pulses are 2+ on the right side, and 2+ on the left side.  No lower extremity edema  Pulmonary/Chest: Effort normal and breath sounds normal. No accessory muscle usage or stridor. No tachypnea. No respiratory distress. He has no decreased breath sounds. He has no wheezes. He has no rhonchi. He has no rales.  Lungs clear to auscultation anteriorly and posteriorly, no wheeze rales or rhonchi, symmetrical chest expansion, good inspiratory effort, no coughing with lung exam, no chest wall tenderness to palpation, no tachypnea, no retractions, no accessory muscle use  Abdominal: Soft. Normal appearance and bowel sounds are normal. He exhibits no distension and no mass. There is no tenderness. There is no guarding.  Musculoskeletal: Normal range of motion. He exhibits no edema.  Lymphadenopathy:    He has no cervical adenopathy.  Neurological: He is alert. He exhibits normal muscle tone. Coordination and gait normal.  Skin: Skin is warm, dry and intact. Capillary refill takes less than 2 seconds. No rash noted. He is not diaphoretic. No cyanosis or erythema. No pallor. Nails show no clubbing.  Psychiatric: He has a normal mood and affect. His speech is normal and behavior is normal.  Nursing note and vitals reviewed.         Assessment & Plan:      ICD-10-CM   1. Upper respiratory tract infection,  unspecified type J06.9     Suspect a viral head and chest cold, patient does have comorbidities, CAD, is obese with HTN, HLD, former smoker for about 40 years - quit 10 years ago.  Do suspect a presentation is consistent with early acute bronchitis.  Patient was encouraged to use over-the-counter antihistamines, Mucinex with ample fluids, continue humidifier at night, Tylenol and ibuprofen as needed and as directed on box or bottle for discomfort.  Posterior pharynx and uvula appears most abnormal, patient states that he can frequently feel his uvula and since having a lot of postnasal drip and sore throat is more noticeable and is triggering a lot of his coughing.  I do believe burst of steroids would help this and would help treat the early bronchitis.  We will cover the patient with Z-Pak for the bronchitis because of his comorbidities.  Cough suppressants given, he was advised of side effects and precautions with Hycodan cough syrup  Encouraged follow-up if not improving.  Did check him with a thorough cardiac exam and have no concern for any CHF, heart failure pulmonary IMA.  I did check his last EKG for QTC he has no history of QTC prolongation there is no contraindication for Z-Pak.   Delsa Grana, PA-C 02/20/18 10:49 AM

## 2018-02-24 DIAGNOSIS — M9901 Segmental and somatic dysfunction of cervical region: Secondary | ICD-10-CM | POA: Diagnosis not present

## 2018-02-24 DIAGNOSIS — M9902 Segmental and somatic dysfunction of thoracic region: Secondary | ICD-10-CM | POA: Diagnosis not present

## 2018-02-24 DIAGNOSIS — G43001 Migraine without aura, not intractable, with status migrainosus: Secondary | ICD-10-CM | POA: Diagnosis not present

## 2018-02-24 DIAGNOSIS — M5033 Other cervical disc degeneration, cervicothoracic region: Secondary | ICD-10-CM | POA: Diagnosis not present

## 2018-03-09 ENCOUNTER — Encounter: Payer: Self-pay | Admitting: Family Medicine

## 2018-03-09 ENCOUNTER — Ambulatory Visit (INDEPENDENT_AMBULATORY_CARE_PROVIDER_SITE_OTHER): Payer: Medicare HMO | Admitting: Family Medicine

## 2018-03-09 VITALS — BP 134/82 | HR 66 | Temp 98.0°F | Resp 16 | Ht 70.0 in | Wt 218.0 lb

## 2018-03-09 DIAGNOSIS — R05 Cough: Secondary | ICD-10-CM | POA: Diagnosis not present

## 2018-03-09 DIAGNOSIS — R058 Other specified cough: Secondary | ICD-10-CM

## 2018-03-09 MED ORDER — HYDROCOD POLST-CPM POLST ER 10-8 MG/5ML PO SUER: 5.0000 mL | Freq: Two times a day (BID) | ORAL | 0 refills | Status: DC | PRN

## 2018-03-09 NOTE — Progress Notes (Signed)
Subjective:    Patient ID: Todd Patterson, male    DOB: 06-24-1950, 67 y.o.   MRN: [id number]  HPI  09/16/17 Patient is a very pleasant 67 year old white male whose past medical history is significant for myocardial infarction in 2008. Patient underwent PTCA with stenting.  Patient states that he had a bare-metal stent. He was continued on aspirin and Plavix for 2 years and then was instructed to continue aspirin thereafter. He also has a history of hyperlipidemia and hypertension.  2 weeks ago, the patient developed double vision.  If he closes his right eye, the double vision will go away.  If he opens the right eye, the double vision is present.  It is horizontal double vision.  He states that it is constant as long as his right eye is open.  It does not wax and wane throughout the day.  He denies any trouble swallowing.  He denies any muscle weakness.  He denies any pain in his.  He does have some mild headache.  This is diffuse in nature.  It is located mainly in the back of his neck and in his sinus area.  He denies any pain or tenderness over his temporal artery.  He denies any jaw claudication.  He denies any pain in his shoulders or in his hips.  He has no past medical history for migraines.  He denies any symptoms of multiple sclerosis.  On physical exam, the patient is unable to abduct his right eye on lateral gaze to the right.  He is able to abduct his right eye on lateral gaze to the left.  He has normal upward gaze.  He has normal downward gaze.  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  Differential diagnosis includes idiopathic 6 cranial nerve palsy, internuclear ophthalmoplegia, ischemic insult to the 6 cranial nerve or its nucleus, myasthenia gravis although unlikely based on his exam, temporal arteritis although unlikely based on  his exam, Lyme disease with an isolated 6 cranial nerve palsy.  I will begin the work-up by obtaining an MRI of the brain to rule out ischemic insult to the 6 cranial nerve for his nucleus.  I will consult neurology for second opinion.  I suspect that the patient is either suffered an ischemic insult to the 6 cranial nerve or that this is idiopathic 6 cranial nerve palsy that may resolve spontaneously on its own.  I will screen for myasthenia gravis by checking acetylcholine receptor blocking antibodies, I will check for giant cell arteritis with a sedimentation rate/CRP, I will check a TSH to rule out thyroid abnormalities and also check Lyme serologies.    12/01/17 Patient has done very well since I last saw him.  He is recovered the vast majority of his lateral gaze in his right eye.  Today on exam, extraocular movements are intact.  There is no obvious palsy in his right eye with abduction.  MRI revealed chronic white matter changes but no obvious stroke.  The remainder of his lab work showed no evidence of myasthenia gravis or Lyme disease.  Neurology recommended switching aspirin to Plavix 75 mg a day in case this was ischemic in origin.  He is already on maximum dose statin and his blood pressure is well controlled at 130/82.  AT that time, my plan was: Patient had either idiopathic  cranial nerve VI palsy similar to Bell's palsy or an ischemic cranial nerve VI neuropathy.  Patient has recovered virtually 100%.  No further treatment is necessary at this time.  Recommended continuing high-dose statin to treat the patient as though this were an ischemic event as I see no downside to that strategy given his previous history of cardiovascular disease.  Continue Plavix 75 mg daily and focus on blood pressure control.  Patient states that he recently had a CBC, CMP, and a fasting lipid panel at his neurologist office.  I will sign a release of information form to try to get that information from them to ensure  adequate treatment of his cholesterol.  His goal LDL cholesterol is less than 70.  Also want to monitor his liver function test.  03/09/18  Patient developed this cough after Thanksgiving.  Symptoms began with a sore throat, and quickly progressed to head congestion rhinorrhea and a yellow productive cough.  He saw my partner on number the sixth and was given a Z-Pak, prednisone, and Hycodan.  He presents today continuing to report a cough.  He states that during the daytime, the cough is mild.  It will occur sporadically.  It is not severe.  It is not productive.  It is more of an itching irritation in the back of his throat.  At night when he lays down the cough tends to get worse.  Is also triggered by eating.  However he is adamant that he is not experiencing any acid reflux or indigestion.  He also denies any sinus pain or sinus pressure or postnasal drip.  He denies any hemoptysis.  He denies any shortness of breath.  He denies any fevers or chills.  His exam today is completely benign Past Medical History:  Diagnosis Date  . Colon polyps    colonoscopy 2018, recommended repeat in 1 year due to numbe rof polyps.   . Hyperlipidemia   . Hypertension   . Hyperthyroidism    hyperparthyroid  . Myocardial infarction [facility]) 2009   Has 1 stent   Past Surgical History:  Procedure Laterality Date  . APPENDECTOMY    . CORONARY ANGIOPLASTY WITH STENT PLACEMENT  2009   1 stent  . PARATHYROIDECTOMY N/A 07/04/2016   Procedure: PARATHYROIDECTOMY;  Surgeon: Jackolyn Confer, MD;  Location: [facility];  Service: General;  Laterality: N/A;  . TONSILLECTOMY     Current Outpatient Medications on File Prior to Visit  Medication Sig Dispense Refill  . ALPRAZolam (XANAX) 1 MG tablet Take 1-2 tablets thirty minutes prior to MRI.  May take one additional tablet before entering scanner, if needed.  MUST HAVE DRIVER. 3 tablet 0  . atorvastatin (LIPITOR) 80 MG tablet TAKE 1 TABLET BY MOUTH EVERY DAY 90 tablet 1  .  carvedilol (COREG) 6.25 MG tablet TAKE 1 TABLET BY MOUTH TWICE A DAY 180 tablet 1  . clopidogrel (PLAVIX) 75 MG tablet Take 1 tablet (75 mg total) by mouth daily. 30 tablet 11  . HYDROcodone-homatropine (HYCODAN) 5-1.5 MG/5ML syrup Take 5 mLs by mouth every 8 (eight) hours as needed for cough. Do not take with any other sedating meds 120 mL 0  . latanoprost (XALATAN) 0.005 % ophthalmic solution Place 1 drop into both eyes at bedtime.    Marland Kitchen losartan (COZAAR) 50 MG tablet Take 1 tablet (50 mg total) by mouth daily. 90 tablet 3   Current Facility-Administered Medications on File Prior to Visit  Medication Dose Route Frequency Provider Last Rate Last  Dose  . 0.9 %  sodium chloride infusion  500 mL Intravenous Continuous Nandigam, Kavitha V, MD      . 0.9 %  sodium chloride infusion  500 mL Intravenous Continuous Nandigam, Venia Minks, MD       Allergies  Allergen Reactions  . Lisinopril Cough   Social History   Socioeconomic History  . Marital status: Married    Spouse name: Not on file  . Number of children: Not on file  . Years of education: Not on file  . Highest education level: Not on file  Occupational History  . Not on file  Social Needs  . Financial resource strain: Not on file  . Food insecurity:    Worry: Not on file    Inability: Not on file  . Transportation needs:    Medical: Not on file    Non-medical: Not on file  Tobacco Use  . Smoking status: Former Smoker    Types: Cigarettes    Last attempt to quit: 03/19/2007    Years since quitting: 10.9  . Smokeless tobacco: Never Used  Substance and Sexual Activity  . Alcohol use: Yes    Alcohol/week: 12.0 - 14.0 standard drinks    Types: 12 - 14 Cans of beer per week  . Drug use: No  . Sexual activity: Yes  Lifestyle  . Physical activity:    Days per week: Not on file    Minutes per session: Not on file  . Stress: Not on file  Relationships  . Social connections:    Talks on phone: Not on file    Gets together: Not  on file    Attends religious service: Not on file    Active member of club or organization: Not on file    Attends meetings of clubs or organizations: Not on file    Relationship status: Not on file  . Intimate partner violence:    Fear of current or ex partner: Not on file    Emotionally abused: Not on file    Physically abused: Not on file    Forced sexual activity: Not on file  Other Topics Concern  . Not on file  Social History Narrative  . Not on file   Family History  Problem Relation Age of Onset  . Dementia Mother   . Heart disease Father   . Cancer Sister   . Breast cancer Sister       Review of Systems  All other systems reviewed and are negative.      Objective:   Physical Exam  Constitutional: He is oriented to person, place, and time. He appears well-developed and well-nourished. No distress.  Eyes: Pupils are equal, round, and reactive to light. Conjunctivae and EOM are normal. Right eye exhibits normal extraocular motion and no nystagmus. Left eye exhibits normal extraocular motion and no nystagmus.  Cardiovascular: Normal rate, regular rhythm, normal heart sounds and intact distal pulses. Exam reveals no gallop and no friction rub.  No murmur heard. Pulmonary/Chest: Effort normal and breath sounds normal. No respiratory distress. He has no wheezes. He has no rales. He exhibits no tenderness.  Abdominal: Soft. Bowel sounds are normal. He exhibits no distension and no mass. There is no abdominal tenderness. There is no rebound and no guarding.  Neurological: He is alert and oriented to person, place, and time. He has normal strength and normal reflexes. He displays no atrophy and no tremor. No cranial nerve deficit or sensory deficit. He exhibits  normal muscle tone. He displays a negative Romberg sign. Coordination and gait normal.  Skin: He is not diaphoretic.  Vitals reviewed.         Assessment & Plan:  I believe the patient has upper airway cough  syndrome.  I explained to the patient that this is an irritant cough.  The most common irritants are persistent coughing which can actually trigger further coughing, postnasal drip from sinusitis, and undiagnosed acid reflux.  He does not believe acid reflux is playing a role.  We will start by putting him on Tussionex 1 teaspoon at night prior to bed.  He can take it every 12 hours as needed but this is meant to calm any nerve irritation in the throat that may perpetuate the cycle of coughing.  I have also recommended trying Flonase 2 sprays each nostril daily to calm any postnasal drip from chronic sinusitis that may be contributing to the cough.  If the cough persists, consider a PPI for laryngo-esophageal reflux.  Reassess in 1 to 2 weeks if persistent or sooner if worse

## 2018-03-22 ENCOUNTER — Other Ambulatory Visit: Payer: Self-pay | Admitting: Family Medicine

## 2018-04-07 DIAGNOSIS — M9901 Segmental and somatic dysfunction of cervical region: Secondary | ICD-10-CM | POA: Diagnosis not present

## 2018-04-07 DIAGNOSIS — M9902 Segmental and somatic dysfunction of thoracic region: Secondary | ICD-10-CM | POA: Diagnosis not present

## 2018-04-07 DIAGNOSIS — G43001 Migraine without aura, not intractable, with status migrainosus: Secondary | ICD-10-CM | POA: Diagnosis not present

## 2018-04-07 DIAGNOSIS — M5033 Other cervical disc degeneration, cervicothoracic region: Secondary | ICD-10-CM | POA: Diagnosis not present

## 2018-05-19 DIAGNOSIS — M9901 Segmental and somatic dysfunction of cervical region: Secondary | ICD-10-CM | POA: Diagnosis not present

## 2018-05-19 DIAGNOSIS — M9902 Segmental and somatic dysfunction of thoracic region: Secondary | ICD-10-CM | POA: Diagnosis not present

## 2018-05-19 DIAGNOSIS — M5033 Other cervical disc degeneration, cervicothoracic region: Secondary | ICD-10-CM | POA: Diagnosis not present

## 2018-05-19 DIAGNOSIS — G43001 Migraine without aura, not intractable, with status migrainosus: Secondary | ICD-10-CM | POA: Diagnosis not present

## 2018-06-26 ENCOUNTER — Encounter: Payer: Self-pay | Admitting: Family Medicine

## 2018-06-27 ENCOUNTER — Encounter: Payer: Self-pay | Admitting: Family Medicine

## 2018-07-01 DIAGNOSIS — G43001 Migraine without aura, not intractable, with status migrainosus: Secondary | ICD-10-CM | POA: Diagnosis not present

## 2018-07-01 DIAGNOSIS — M9901 Segmental and somatic dysfunction of cervical region: Secondary | ICD-10-CM | POA: Diagnosis not present

## 2018-07-01 DIAGNOSIS — M9902 Segmental and somatic dysfunction of thoracic region: Secondary | ICD-10-CM | POA: Diagnosis not present

## 2018-07-01 DIAGNOSIS — M5033 Other cervical disc degeneration, cervicothoracic region: Secondary | ICD-10-CM | POA: Diagnosis not present

## 2018-08-07 ENCOUNTER — Other Ambulatory Visit: Payer: Self-pay | Admitting: Neurology

## 2018-08-11 ENCOUNTER — Other Ambulatory Visit: Payer: Self-pay | Admitting: Family Medicine

## 2018-08-12 DIAGNOSIS — M5033 Other cervical disc degeneration, cervicothoracic region: Secondary | ICD-10-CM | POA: Diagnosis not present

## 2018-08-12 DIAGNOSIS — G43001 Migraine without aura, not intractable, with status migrainosus: Secondary | ICD-10-CM | POA: Diagnosis not present

## 2018-08-12 DIAGNOSIS — M9901 Segmental and somatic dysfunction of cervical region: Secondary | ICD-10-CM | POA: Diagnosis not present

## 2018-08-12 DIAGNOSIS — M9902 Segmental and somatic dysfunction of thoracic region: Secondary | ICD-10-CM | POA: Diagnosis not present

## 2018-08-13 ENCOUNTER — Ambulatory Visit (INDEPENDENT_AMBULATORY_CARE_PROVIDER_SITE_OTHER): Payer: Medicare HMO | Admitting: Neurology

## 2018-08-13 ENCOUNTER — Encounter: Payer: Self-pay | Admitting: Neurology

## 2018-08-13 ENCOUNTER — Other Ambulatory Visit: Payer: Self-pay

## 2018-08-13 DIAGNOSIS — M9901 Segmental and somatic dysfunction of cervical region: Secondary | ICD-10-CM | POA: Diagnosis not present

## 2018-08-13 DIAGNOSIS — M9902 Segmental and somatic dysfunction of thoracic region: Secondary | ICD-10-CM | POA: Diagnosis not present

## 2018-08-13 DIAGNOSIS — G43001 Migraine without aura, not intractable, with status migrainosus: Secondary | ICD-10-CM | POA: Diagnosis not present

## 2018-08-13 DIAGNOSIS — H4921 Sixth [abducent] nerve palsy, right eye: Secondary | ICD-10-CM

## 2018-08-13 DIAGNOSIS — M5033 Other cervical disc degeneration, cervicothoracic region: Secondary | ICD-10-CM | POA: Diagnosis not present

## 2018-08-13 MED ORDER — CLOPIDOGREL BISULFATE 75 MG PO TABS: 75.0000 mg | ORAL_TABLET | Freq: Every day | ORAL | 4 refills | Status: DC

## 2018-08-13 NOTE — Progress Notes (Signed)
PATIENT: Matty Vanroekel DOB: 10-06-1950  No chief complaint on file.    HISTORICAL  Kiowa Hollar is a 68 year old male, seen in refer by his primary care doctor Jenna Luo for evaluation of diplopia, initial evaluation was on September 22, 2017,  He has past medical history of hypertension, hyperlipidemia, coronary artery disease, parathyroidectomy for benign parathyroid tumor,  He still works at a golf course, on September 01, 2017, he had sudden onset double vision while driving her car home, he does have double vision side-by-side, which has been persistent since his onset, he denies vertigo, lateralized motor deficit or sensory loss, he denies gait abnormality.  He presented to the emergency room on September 05, 2017, CT angiogram of head and neck showed 40 to 50% stenosis of bilateral carotid artery, additional mild to moderate atherosclerotic disease,  Laboratory evaluations showed normal TSH, C-reactive protein, ESR, negative acetylcholine receptor antibody, mild elevated WBC of 14.3, normal BMP.  Virtual Visit via Video  I connected with Adisa Litt on 08/13/18 at  by Video and verified that I am speaking with the correct person using two identifiers.   I discussed the limitations, risks, security and privacy concerns of performing an evaluation and management service by video and the availability of in person appointments. I also discussed with the patient that there may be a patient responsible charge related to this service. The patient expressed understanding and agreed to proceed.  History of Present Illness: Patient is with his wife at today's interview, his symptoms has much improved, his double vision last about 70 days, now back to baseline,  I personally reviewed MRI brain in July 2019.  Mild small vessel disease, no acute abnormality,  He was taking aspirin 325 mg daily when he developed right 6th nerve palsy, switch to Plavix 75 mg seems   Observations/Objective: I have  reviewed problem lists, medications, allergies.  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 The patient was provided an opportunity to ask questions and all were answered. The patient agreed with the plan and demonstrated an understanding of the instructions.   The patient was advised to call back or seek an in-person evaluation if the symptoms worsen or if the condition fails to improve as anticipated.  I provided 15 minutes of non-face-to-face time during this encounter.   [name], MD

## 2018-08-17 DIAGNOSIS — M9902 Segmental and somatic dysfunction of thoracic region: Secondary | ICD-10-CM | POA: Diagnosis not present

## 2018-08-17 DIAGNOSIS — M5033 Other cervical disc degeneration, cervicothoracic region: Secondary | ICD-10-CM | POA: Diagnosis not present

## 2018-08-17 DIAGNOSIS — M9901 Segmental and somatic dysfunction of cervical region: Secondary | ICD-10-CM | POA: Diagnosis not present

## 2018-08-17 DIAGNOSIS — G43001 Migraine without aura, not intractable, with status migrainosus: Secondary | ICD-10-CM | POA: Diagnosis not present

## 2018-08-21 DIAGNOSIS — M9902 Segmental and somatic dysfunction of thoracic region: Secondary | ICD-10-CM | POA: Diagnosis not present

## 2018-08-21 DIAGNOSIS — G43001 Migraine without aura, not intractable, with status migrainosus: Secondary | ICD-10-CM | POA: Diagnosis not present

## 2018-08-21 DIAGNOSIS — M9901 Segmental and somatic dysfunction of cervical region: Secondary | ICD-10-CM | POA: Diagnosis not present

## 2018-08-21 DIAGNOSIS — M5033 Other cervical disc degeneration, cervicothoracic region: Secondary | ICD-10-CM | POA: Diagnosis not present

## 2018-08-24 DIAGNOSIS — M9902 Segmental and somatic dysfunction of thoracic region: Secondary | ICD-10-CM | POA: Diagnosis not present

## 2018-08-24 DIAGNOSIS — G43001 Migraine without aura, not intractable, with status migrainosus: Secondary | ICD-10-CM | POA: Diagnosis not present

## 2018-08-24 DIAGNOSIS — M5033 Other cervical disc degeneration, cervicothoracic region: Secondary | ICD-10-CM | POA: Diagnosis not present

## 2018-08-24 DIAGNOSIS — M9901 Segmental and somatic dysfunction of cervical region: Secondary | ICD-10-CM | POA: Diagnosis not present

## 2018-08-26 DIAGNOSIS — M9902 Segmental and somatic dysfunction of thoracic region: Secondary | ICD-10-CM | POA: Diagnosis not present

## 2018-08-26 DIAGNOSIS — M9901 Segmental and somatic dysfunction of cervical region: Secondary | ICD-10-CM | POA: Diagnosis not present

## 2018-08-26 DIAGNOSIS — G43001 Migraine without aura, not intractable, with status migrainosus: Secondary | ICD-10-CM | POA: Diagnosis not present

## 2018-08-26 DIAGNOSIS — M5033 Other cervical disc degeneration, cervicothoracic region: Secondary | ICD-10-CM | POA: Diagnosis not present

## 2018-09-02 ENCOUNTER — Other Ambulatory Visit: Payer: Self-pay

## 2018-09-03 ENCOUNTER — Encounter: Payer: Self-pay | Admitting: Family Medicine

## 2018-09-03 ENCOUNTER — Other Ambulatory Visit: Payer: Self-pay

## 2018-09-03 ENCOUNTER — Ambulatory Visit (INDEPENDENT_AMBULATORY_CARE_PROVIDER_SITE_OTHER): Payer: Medicare HMO | Admitting: Family Medicine

## 2018-09-03 VITALS — BP 126/74 | HR 68 | Temp 98.1°F | Resp 18 | Ht 70.0 in | Wt 216.0 lb

## 2018-09-03 DIAGNOSIS — M25561 Pain in right knee: Secondary | ICD-10-CM | POA: Diagnosis not present

## 2018-09-03 NOTE — Progress Notes (Signed)
Subjective:    Patient ID: Todd Patterson, male    DOB: Oct 03, 1950, 68 y.o.   MRN: [id number]  Patient was recently walking up a step when he felt a pop over the medial aspect of his right knee.  He instantly felt sharp pain and instability in the knee.  Pain was exacerbated by continuing to go up the steps.  Since that time he has had pain in his knee going up and down steps.  The pain is located over the medial joint line.  He denies any buckling.  He does have some swelling.  He has been taking periodic ibuprofen.  He denies any erythema. Past Medical History:  Diagnosis Date  . Colon polyps    colonoscopy 2018, recommended repeat in 1 year due to numbe rof polyps.   . Hyperlipidemia   . Hypertension   . Hyperthyroidism    hyperparthyroid  . Myocardial infarction [facility]) 2009   Has 1 stent   Past Surgical History:  Procedure Laterality Date  . APPENDECTOMY    . CORONARY ANGIOPLASTY WITH STENT PLACEMENT  2009   1 stent  . PARATHYROIDECTOMY N/A 07/04/2016   Procedure: PARATHYROIDECTOMY;  Surgeon: Jackolyn Confer, MD;  Location: [facility];  Service: General;  Laterality: N/A;  . TONSILLECTOMY     Current Outpatient Medications on File Prior to Visit  Medication Sig Dispense Refill  . ALPRAZolam (XANAX) 1 MG tablet Take 1-2 tablets thirty minutes prior to MRI.  May take one additional tablet before entering scanner, if needed.  MUST HAVE DRIVER. 3 tablet 0  . atorvastatin (LIPITOR) 80 MG tablet TAKE 1 TABLET BY MOUTH EVERY DAY 90 tablet 1  . carvedilol (COREG) 6.25 MG tablet TAKE 1 TABLET BY MOUTH TWICE A DAY 180 tablet 1  . clopidogrel (PLAVIX) 75 MG tablet Take 1 tablet (75 mg total) by mouth daily. 90 tablet 4  . latanoprost (XALATAN) 0.005 % ophthalmic solution Place 1 drop into both eyes at bedtime.    Marland Kitchen losartan (COZAAR) 50 MG tablet Take 1 tablet (50 mg total) by mouth daily. (Patient taking differently: Take 50 mg by mouth 2 (two) times daily. ) 90 tablet 3   Current  Facility-Administered Medications on File Prior to Visit  Medication Dose Route Frequency Provider Last Rate Last Dose  . 0.9 %  sodium chloride infusion  500 mL Intravenous Continuous Nandigam, Kavitha V, MD      . 0.9 %  sodium chloride infusion  500 mL Intravenous Continuous Nandigam, Venia Minks, MD       Allergies  Allergen Reactions  . Lisinopril Cough   Social History   Socioeconomic History  . Marital status: Married    Spouse name: Not on file  . Number of children: Not on file  . Years of education: Not on file  . Highest education level: Not on file  Occupational History  . Not on file  Social Needs  . Financial resource strain: Not on file  . Food insecurity    Worry: Not on file    Inability: Not on file  . Transportation needs    Medical: Not on file    Non-medical: Not on file  Tobacco Use  . Smoking status: Former Smoker    Types: Cigarettes    Quit date: 03/19/2007    Years since quitting: 11.4  . Smokeless tobacco: Never Used  Substance and Sexual Activity  . Alcohol use: Yes    Alcohol/week: 12.0 - 14.0 standard drinks  Types: 12 - 14 Cans of beer per week  . Drug use: No  . Sexual activity: Yes  Lifestyle  . Physical activity    Days per week: Not on file    Minutes per session: Not on file  . Stress: Not on file  Relationships  . Social Herbalist on phone: Not on file    Gets together: Not on file    Attends religious service: Not on file    Active member of club or organization: Not on file    Attends meetings of clubs or organizations: Not on file    Relationship status: Not on file  . Intimate partner violence    Fear of current or ex partner: Not on file    Emotionally abused: Not on file    Physically abused: Not on file    Forced sexual activity: Not on file  Other Topics Concern  . Not on file  Social History Narrative  . Not on file   Family History  Problem Relation Age of Onset  . Dementia Mother   . Heart  disease Father   . Cancer Sister   . Breast cancer Sister       Review of Systems  All other systems reviewed and are negative.      Objective:   Physical Exam  Constitutional: He appears well-developed and well-nourished. No distress.  Eyes: Pupils are equal, round, and reactive to light. Conjunctivae and EOM are normal. Right eye exhibits normal extraocular motion and no nystagmus. Left eye exhibits normal extraocular motion and no nystagmus.  Cardiovascular: Normal rate, regular rhythm, normal heart sounds and intact distal pulses. Exam reveals no gallop and no friction rub.  No murmur heard. Pulmonary/Chest: Effort normal and breath sounds normal. No respiratory distress. He has no wheezes. He has no rales. He exhibits no tenderness.  Musculoskeletal:     Right knee: He exhibits decreased range of motion, swelling, effusion and abnormal meniscus. Tenderness found. Medial joint line tenderness noted.  Neurological: He has normal strength. He displays no atrophy and no tremor. No sensory deficit. He displays a negative Romberg sign. Gait normal.  Skin: He is not diaphoretic.  Vitals reviewed.         Assessment & Plan:  The encounter diagnosis was Acute pain of right knee. 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  We discussed options including ibuprofen twice daily for 1 to 2 weeks, wearing a knee brace, applying ice 2-3 times a day, a cortisone injection in the knee, or an MRI to evaluate further.  The patient elects to try ibuprofen with the knee brace and ice and then reassess in 2 to 3 weeks.  I explained the increased risk of GI bleeding with ibuprofen and Plavix and counseled him to use it for 7 to 10 days and if no better return for cortisone injection and imaging if necessary.

## 2018-09-23 DIAGNOSIS — M9901 Segmental and somatic dysfunction of cervical region: Secondary | ICD-10-CM | POA: Diagnosis not present

## 2018-09-23 DIAGNOSIS — M5033 Other cervical disc degeneration, cervicothoracic region: Secondary | ICD-10-CM | POA: Diagnosis not present

## 2018-09-23 DIAGNOSIS — G43001 Migraine without aura, not intractable, with status migrainosus: Secondary | ICD-10-CM | POA: Diagnosis not present

## 2018-09-23 DIAGNOSIS — M9902 Segmental and somatic dysfunction of thoracic region: Secondary | ICD-10-CM | POA: Diagnosis not present

## 2018-10-26 ENCOUNTER — Other Ambulatory Visit: Payer: Self-pay

## 2018-10-26 ENCOUNTER — Ambulatory Visit (INDEPENDENT_AMBULATORY_CARE_PROVIDER_SITE_OTHER): Payer: Medicare HMO | Admitting: Family Medicine

## 2018-10-26 ENCOUNTER — Encounter: Payer: Self-pay | Admitting: Family Medicine

## 2018-10-26 VITALS — BP 126/78 | HR 70 | Temp 98.6°F | Resp 16 | Ht 70.0 in | Wt 219.0 lb

## 2018-10-26 DIAGNOSIS — M25561 Pain in right knee: Secondary | ICD-10-CM | POA: Diagnosis not present

## 2018-10-26 DIAGNOSIS — G8929 Other chronic pain: Secondary | ICD-10-CM | POA: Diagnosis not present

## 2018-10-26 NOTE — Progress Notes (Signed)
Subjective:    Patient ID: Todd Patterson, male    DOB: 1950-06-29, 68 y.o.   MRN: [id number] 09/03/18 Patient was recently walking up a step when he felt a pop over the medial aspect of his right knee.  He instantly felt sharp pain and instability in the knee.  Pain was exacerbated by continuing to go up the steps.  Since that time he has had pain in his knee going up and down steps.  The pain is located over the medial joint line.  He denies any buckling.  He does have some swelling.  He has been taking periodic ibuprofen.  He denies any erythema.  At that time, my plan was: I believe the patient likely has osteoarthritis in the right knee coupled with a medial meniscal tear.  We discussed options including ibuprofen twice daily for 1 to 2 weeks, wearing a knee brace, applying ice 2-3 times a day, a cortisone injection in the knee, or an MRI to evaluate further.  The patient elects to try ibuprofen with the knee brace and ice and then reassess in 2 to 3 weeks.  I explained the increased risk of GI bleeding with ibuprofen and Plavix and counseled him to use it for 7 to 10 days and if no better return for cortisone injection and imaging if necessary.  10/26/18 Patient continues to have right knee pain primarily over the medial compartment.  Patient continues to experience sharp pain with ambulation particularly standing and walking or going up and down steps.  He denies any laxity.  He denies any locking.  He sees no benefit with ibuprofen. Past Medical History:  Diagnosis Date  . Colon polyps    colonoscopy 2018, recommended repeat in 1 year due to numbe rof polyps.   . Hyperlipidemia   . Hypertension   . Hyperthyroidism    hyperparthyroid  . Myocardial infarction [facility]) 2009   Has 1 stent   Past Surgical History:  Procedure Laterality Date  . APPENDECTOMY    . CORONARY ANGIOPLASTY WITH STENT PLACEMENT  2009   1 stent  . PARATHYROIDECTOMY N/A 07/04/2016   Procedure: PARATHYROIDECTOMY;   Surgeon: Jackolyn Confer, MD;  Location: [facility];  Service: General;  Laterality: N/A;  . TONSILLECTOMY     Current Outpatient Medications on File Prior to Visit  Medication Sig Dispense Refill  . ALPRAZolam (XANAX) 1 MG tablet Take 1-2 tablets thirty minutes prior to MRI.  May take one additional tablet before entering scanner, if needed.  MUST HAVE DRIVER. 3 tablet 0  . atorvastatin (LIPITOR) 80 MG tablet TAKE 1 TABLET BY MOUTH EVERY DAY 90 tablet 1  . carvedilol (COREG) 6.25 MG tablet TAKE 1 TABLET BY MOUTH TWICE A DAY 180 tablet 1  . clopidogrel (PLAVIX) 75 MG tablet Take 1 tablet (75 mg total) by mouth daily. 90 tablet 4  . latanoprost (XALATAN) 0.005 % ophthalmic solution Place 1 drop into both eyes at bedtime.    Marland Kitchen losartan (COZAAR) 50 MG tablet Take 1 tablet (50 mg total) by mouth daily. (Patient taking differently: Take 50 mg by mouth 2 (two) times daily. ) 90 tablet 3   Current Facility-Administered Medications on File Prior to Visit  Medication Dose Route Frequency Provider Last Rate Last Dose  . 0.9 %  sodium chloride infusion  500 mL Intravenous Continuous Nandigam, Kavitha V, MD      . 0.9 %  sodium chloride infusion  500 mL Intravenous Continuous Nandigam, Venia Minks, MD  Allergies  Allergen Reactions  . Lisinopril Cough   Social History   Socioeconomic History  . Marital status: Married    Spouse name: Not on file  . Number of children: Not on file  . Years of education: Not on file  . Highest education level: Not on file  Occupational History  . Not on file  Social Needs  . Financial resource strain: Not on file  . Food insecurity    Worry: Not on file    Inability: Not on file  . Transportation needs    Medical: Not on file    Non-medical: Not on file  Tobacco Use  . Smoking status: Former Smoker    Types: Cigarettes    Quit date: 03/19/2007    Years since quitting: 11.6  . Smokeless tobacco: Never Used  Substance and Sexual Activity  . Alcohol use:  Yes    Alcohol/week: 12.0 - 14.0 standard drinks    Types: 12 - 14 Cans of beer per week  . Drug use: No  . Sexual activity: Yes  Lifestyle  . Physical activity    Days per week: Not on file    Minutes per session: Not on file  . Stress: Not on file  Relationships  . Social Herbalist on phone: Not on file    Gets together: Not on file    Attends religious service: Not on file    Active member of club or organization: Not on file    Attends meetings of clubs or organizations: Not on file    Relationship status: Not on file  . Intimate partner violence    Fear of current or ex partner: Not on file    Emotionally abused: Not on file    Physically abused: Not on file    Forced sexual activity: Not on file  Other Topics Concern  . Not on file  Social History Narrative  . Not on file   Family History  Problem Relation Age of Onset  . Dementia Mother   . Heart disease Father   . Cancer Sister   . Breast cancer Sister       Review of Systems  All other systems reviewed and are negative.      Objective:   Physical Exam  Constitutional: He appears well-developed and well-nourished. No distress.  Eyes: Pupils are equal, round, and reactive to light. Conjunctivae and EOM are normal. Right eye exhibits normal extraocular motion and no nystagmus. Left eye exhibits normal extraocular motion and no nystagmus.  Cardiovascular: Normal rate, regular rhythm, normal heart sounds and intact distal pulses. Exam reveals no gallop and no friction rub.  No murmur heard. Pulmonary/Chest: Effort normal and breath sounds normal. No respiratory distress. He has no wheezes. He has no rales. He exhibits no tenderness.  Musculoskeletal:     Right knee: He exhibits decreased range of motion, swelling, effusion and abnormal meniscus. Tenderness found. Medial joint line tenderness noted.  Neurological: He has normal strength. He displays no atrophy and no tremor. No sensory deficit. He  displays a negative Romberg sign. Gait normal.  Skin: He is not diaphoretic.  Vitals reviewed.         Assessment & Plan:  The encounter diagnosis was Chronic pain of right knee. Patient has had the knee pain now for greater than 6 weeks.  The pain is not improving.  He is using NSAIDs without relief.  Using sterile technique, I injected the right knee with  2 cc of lidocaine 0.1% without epinephrine, 2 cc of Marcaine, and 2 cc of 40 mg/mL Kenalog.  The patient tolerated the procedure well without complication.  If the knee pain does not improve, I would recommend an MRI of the knee as I suspect a meniscal tear that may require surgical repair.

## 2018-10-28 DIAGNOSIS — M9902 Segmental and somatic dysfunction of thoracic region: Secondary | ICD-10-CM | POA: Diagnosis not present

## 2018-10-28 DIAGNOSIS — M9901 Segmental and somatic dysfunction of cervical region: Secondary | ICD-10-CM | POA: Diagnosis not present

## 2018-10-28 DIAGNOSIS — M5033 Other cervical disc degeneration, cervicothoracic region: Secondary | ICD-10-CM | POA: Diagnosis not present

## 2018-10-28 DIAGNOSIS — G43001 Migraine without aura, not intractable, with status migrainosus: Secondary | ICD-10-CM | POA: Diagnosis not present

## 2018-11-16 ENCOUNTER — Other Ambulatory Visit: Payer: Self-pay | Admitting: Family Medicine

## 2018-11-16 DIAGNOSIS — E213 Hyperparathyroidism, unspecified: Secondary | ICD-10-CM

## 2018-11-16 DIAGNOSIS — I10 Essential (primary) hypertension: Secondary | ICD-10-CM

## 2018-11-16 DIAGNOSIS — E7800 Pure hypercholesterolemia, unspecified: Secondary | ICD-10-CM

## 2018-11-18 ENCOUNTER — Other Ambulatory Visit: Payer: Medicare HMO

## 2018-11-18 ENCOUNTER — Ambulatory Visit (INDEPENDENT_AMBULATORY_CARE_PROVIDER_SITE_OTHER): Payer: Medicare HMO | Admitting: Family Medicine

## 2018-11-18 ENCOUNTER — Other Ambulatory Visit: Payer: Self-pay

## 2018-11-18 DIAGNOSIS — I10 Essential (primary) hypertension: Secondary | ICD-10-CM | POA: Diagnosis not present

## 2018-11-18 DIAGNOSIS — E7800 Pure hypercholesterolemia, unspecified: Secondary | ICD-10-CM

## 2018-11-18 DIAGNOSIS — E213 Hyperparathyroidism, unspecified: Secondary | ICD-10-CM | POA: Diagnosis not present

## 2018-11-18 DIAGNOSIS — Z23 Encounter for immunization: Secondary | ICD-10-CM

## 2018-11-19 LAB — COMPREHENSIVE METABOLIC PANEL
AG Ratio: 1.7 (calc) (ref 1.0–2.5)
ALT: 24 U/L (ref 9–46)
AST: 15 U/L (ref 10–35)
Albumin: 4.1 g/dL (ref 3.6–5.1)
Alkaline phosphatase (APISO): 53 U/L (ref 35–144)
BUN: 15 mg/dL (ref 7–25)
CO2: 28 mmol/L (ref 20–32)
Calcium: 9.5 mg/dL (ref 8.6–10.3)
Chloride: 102 mmol/L (ref 98–110)
Creat: 0.84 mg/dL (ref 0.70–1.25)
Globulin: 2.4 g/dL (calc) (ref 1.9–3.7)
Glucose, Bld: 90 mg/dL (ref 65–99)
Potassium: 4.7 mmol/L (ref 3.5–5.3)
Sodium: 140 mmol/L (ref 135–146)
Total Bilirubin: 0.7 mg/dL (ref 0.2–1.2)
Total Protein: 6.5 g/dL (ref 6.1–8.1)

## 2018-11-19 LAB — LIPID PANEL
Cholesterol: 176 mg/dL (ref <200)
HDL: 72 mg/dL (ref >=40)
LDL Cholesterol (Calc): 86 mg/dL (calc)
Non-HDL Cholesterol (Calc): 104 mg/dL (calc) (ref <130)
Total CHOL/HDL Ratio: 2.4 (calc) (ref <5.0)
Triglycerides: 88 mg/dL (ref <150)

## 2018-11-19 LAB — CBC WITH DIFFERENTIAL/PLATELET
Absolute Monocytes: 1316 cells/uL — ABNORMAL HIGH (ref 200–950)
Basophils Absolute: 39 cells/uL (ref 0–200)
Basophils Relative: 0.3 %
Eosinophils Absolute: 168 cells/uL (ref 15–500)
Eosinophils Relative: 1.3 %
HCT: 46.7 % (ref 38.5–50.0)
Hemoglobin: 15.9 g/dL (ref 13.2–17.1)
Lymphs Abs: 2722 cells/uL (ref 850–3900)
MCH: 30.7 pg (ref 27.0–33.0)
MCHC: 34 g/dL (ref 32.0–36.0)
MCV: 90.2 fL (ref 80.0–100.0)
MPV: 10.5 fL (ref 7.5–12.5)
Monocytes Relative: 10.2 %
Neutro Abs: 8656 cells/uL — ABNORMAL HIGH (ref 1500–7800)
Neutrophils Relative %: 67.1 %
Platelets: 270 10*3/uL (ref 140–400)
RBC: 5.18 10*6/uL (ref 4.20–5.80)
RDW: 13.2 % (ref 11.0–15.0)
Total Lymphocyte: 21.1 %
WBC: 12.9 10*3/uL — ABNORMAL HIGH (ref 3.8–10.8)

## 2018-11-19 LAB — TSH: TSH: 1.12 mIU/L (ref 0.40–4.50)

## 2018-11-24 ENCOUNTER — Other Ambulatory Visit: Payer: Self-pay | Admitting: Family Medicine

## 2018-11-26 ENCOUNTER — Other Ambulatory Visit: Payer: Self-pay | Admitting: Family Medicine

## 2018-11-26 DIAGNOSIS — D72829 Elevated white blood cell count, unspecified: Secondary | ICD-10-CM

## 2018-11-26 DIAGNOSIS — D72828 Other elevated white blood cell count: Secondary | ICD-10-CM

## 2018-11-30 ENCOUNTER — Other Ambulatory Visit: Payer: Medicare HMO

## 2018-11-30 ENCOUNTER — Other Ambulatory Visit: Payer: Self-pay

## 2018-11-30 DIAGNOSIS — D72828 Other elevated white blood cell count: Secondary | ICD-10-CM

## 2018-12-01 LAB — CBC WITH DIFFERENTIAL/PLATELET
Absolute Monocytes: 1014 cells/uL — ABNORMAL HIGH (ref 200–950)
Basophils Absolute: 55 cells/uL (ref 0–200)
Basophils Relative: 0.5 %
Eosinophils Absolute: 174 cells/uL (ref 15–500)
Eosinophils Relative: 1.6 %
HCT: 45.7 % (ref 38.5–50.0)
Hemoglobin: 15.4 g/dL (ref 13.2–17.1)
Lymphs Abs: 2322 cells/uL (ref 850–3900)
MCH: 30.6 pg (ref 27.0–33.0)
MCHC: 33.7 g/dL (ref 32.0–36.0)
MCV: 90.7 fL (ref 80.0–100.0)
MPV: 10.6 fL (ref 7.5–12.5)
Monocytes Relative: 9.3 %
Neutro Abs: 7336 cells/uL (ref 1500–7800)
Neutrophils Relative %: 67.3 %
Platelets: 251 10*3/uL (ref 140–400)
RBC: 5.04 10*6/uL (ref 4.20–5.80)
RDW: 13.2 % (ref 11.0–15.0)
Total Lymphocyte: 21.3 %
WBC: 10.9 10*3/uL — ABNORMAL HIGH (ref 3.8–10.8)

## 2018-12-01 LAB — PATHOLOGIST SMEAR REVIEW

## 2018-12-06 ENCOUNTER — Other Ambulatory Visit: Payer: Self-pay | Admitting: Family Medicine

## 2018-12-30 DIAGNOSIS — G43001 Migraine without aura, not intractable, with status migrainosus: Secondary | ICD-10-CM | POA: Diagnosis not present

## 2018-12-30 DIAGNOSIS — M9902 Segmental and somatic dysfunction of thoracic region: Secondary | ICD-10-CM | POA: Diagnosis not present

## 2018-12-30 DIAGNOSIS — M9901 Segmental and somatic dysfunction of cervical region: Secondary | ICD-10-CM | POA: Diagnosis not present

## 2018-12-30 DIAGNOSIS — M5033 Other cervical disc degeneration, cervicothoracic region: Secondary | ICD-10-CM | POA: Diagnosis not present

## 2019-01-09 ENCOUNTER — Encounter: Payer: Self-pay | Admitting: Family Medicine

## 2019-02-03 DIAGNOSIS — M5033 Other cervical disc degeneration, cervicothoracic region: Secondary | ICD-10-CM | POA: Diagnosis not present

## 2019-02-03 DIAGNOSIS — G43001 Migraine without aura, not intractable, with status migrainosus: Secondary | ICD-10-CM | POA: Diagnosis not present

## 2019-02-03 DIAGNOSIS — M9902 Segmental and somatic dysfunction of thoracic region: Secondary | ICD-10-CM | POA: Diagnosis not present

## 2019-02-03 DIAGNOSIS — M9901 Segmental and somatic dysfunction of cervical region: Secondary | ICD-10-CM | POA: Diagnosis not present

## 2019-02-17 DIAGNOSIS — H401121 Primary open-angle glaucoma, left eye, mild stage: Secondary | ICD-10-CM | POA: Diagnosis not present

## 2019-02-17 DIAGNOSIS — H40001 Preglaucoma, unspecified, right eye: Secondary | ICD-10-CM | POA: Diagnosis not present

## 2019-03-03 DIAGNOSIS — M9902 Segmental and somatic dysfunction of thoracic region: Secondary | ICD-10-CM | POA: Diagnosis not present

## 2019-03-03 DIAGNOSIS — G43001 Migraine without aura, not intractable, with status migrainosus: Secondary | ICD-10-CM | POA: Diagnosis not present

## 2019-03-03 DIAGNOSIS — M9901 Segmental and somatic dysfunction of cervical region: Secondary | ICD-10-CM | POA: Diagnosis not present

## 2019-03-03 DIAGNOSIS — M5033 Other cervical disc degeneration, cervicothoracic region: Secondary | ICD-10-CM | POA: Diagnosis not present

## 2019-03-04 IMAGING — CR DG CHEST 2V
2 series · 2 of 2 positions shown · non-contrast
Comparison: None in PACs

CLINICAL DATA: Preoperative examination prior to thyroid surgery.
Former smoker. No current chest complaints. History of coronary
artery disease and stent placement following an MI, and
hypertension.

EXAM:
CHEST  2 VIEW

[w chest pa]
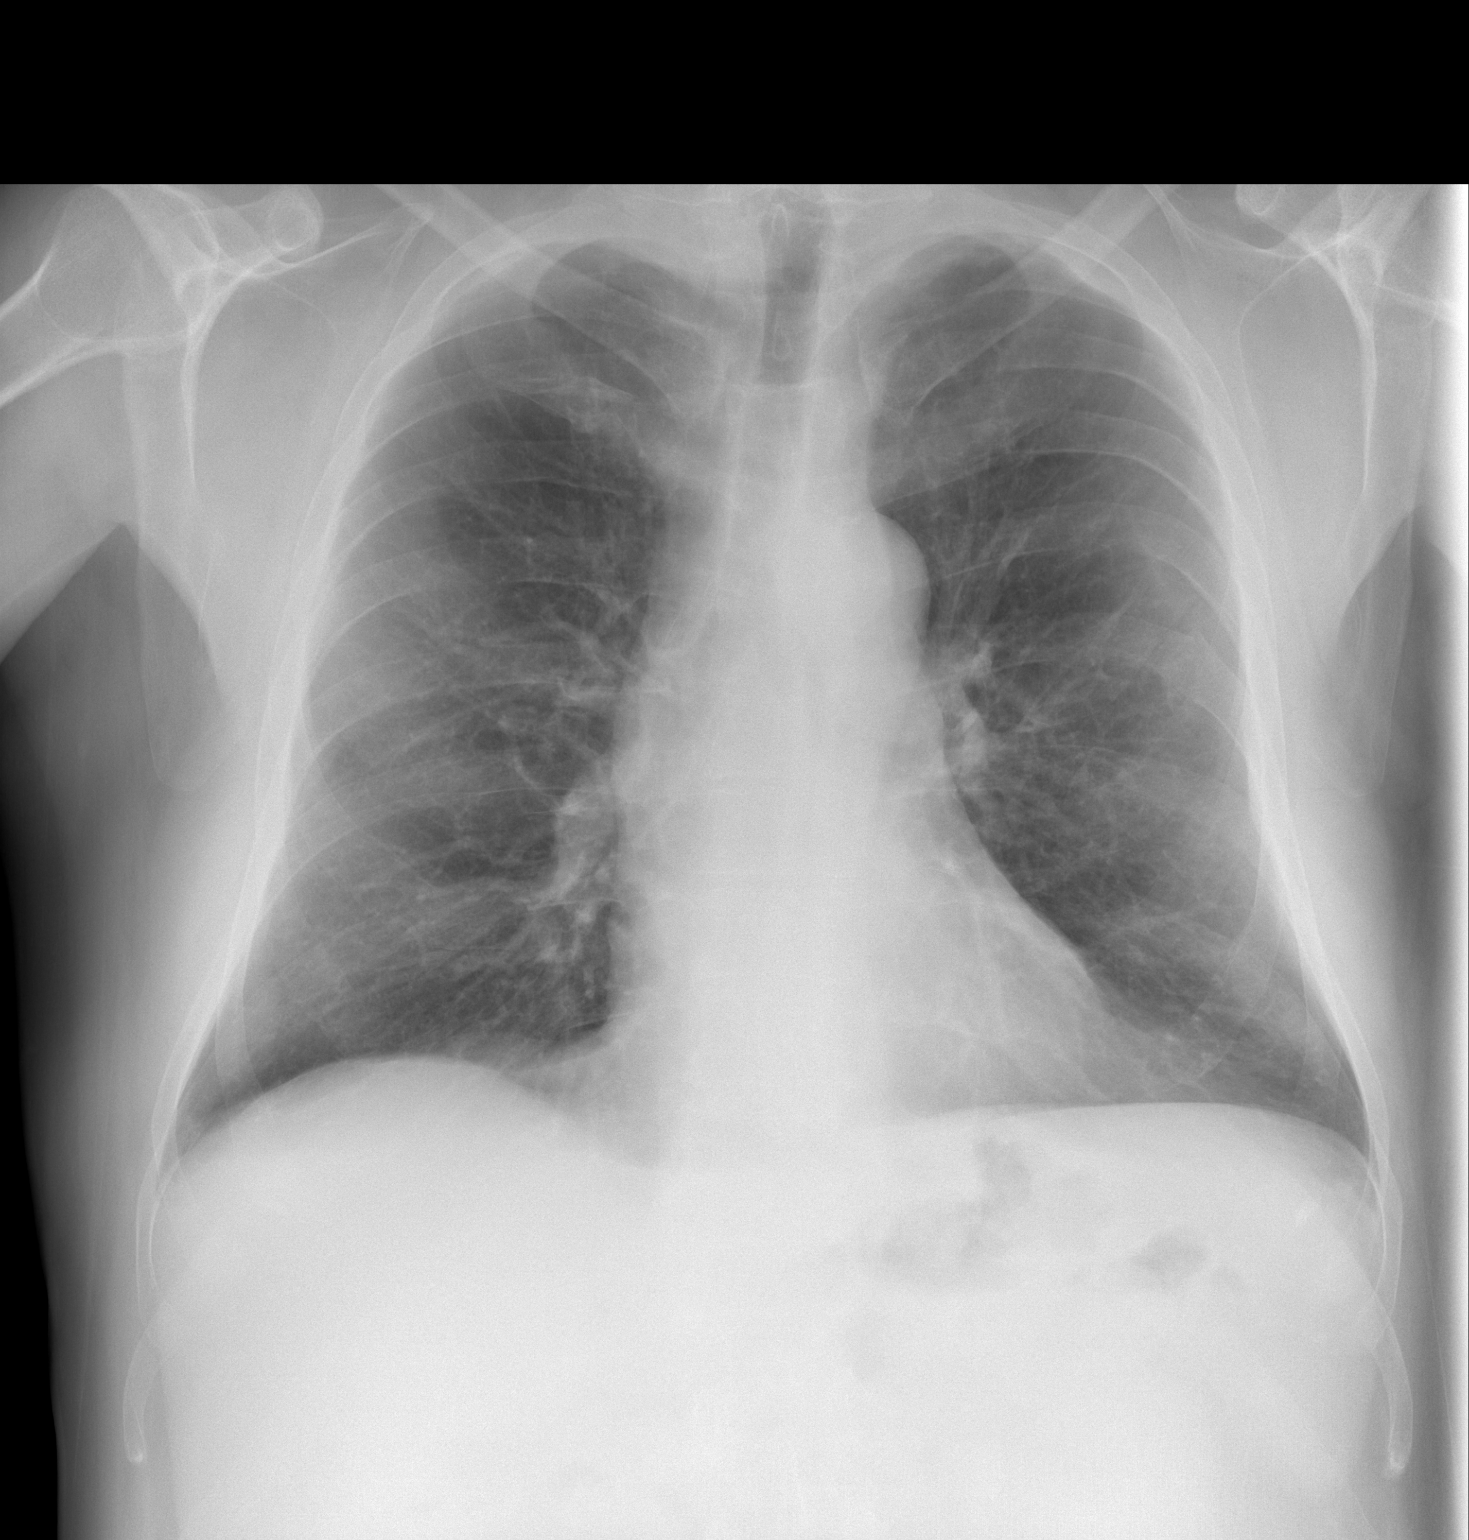

[w chest lat]
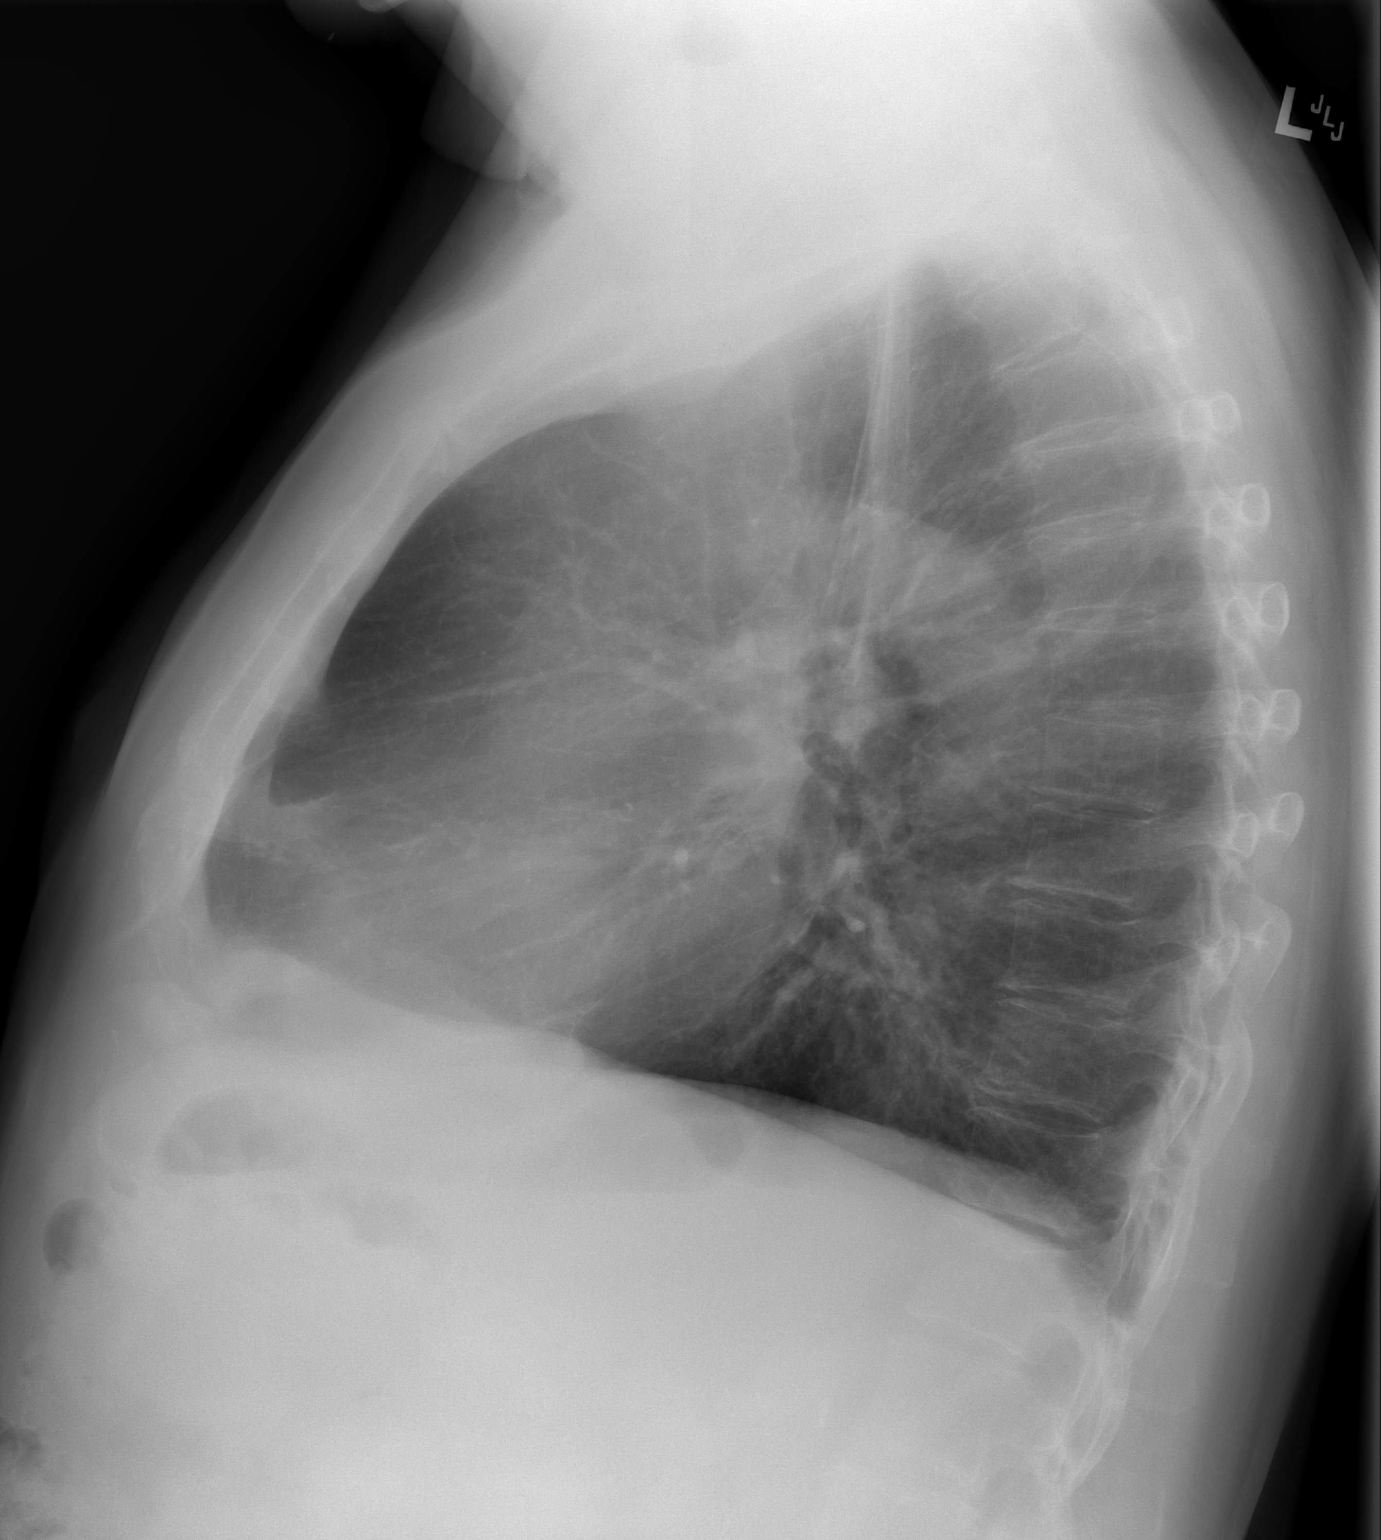

[2 of 2 positions shown; findings below may reference images not displayed]

FINDINGS: The lungs are mildly hyperinflated and clear. The heart and
pulmonary vascularity are normal. A left coronary artery stent is
visible. There is calcification in the wall of the aortic arch. The
mediastinum is normal in width. The trachea is midline. There is no
pleural effusion. There is old deformity of the posterolateral
aspect of the left seventh rib.
IMPRESSION: Mild hyperinflation consistent with chronic bronchitis and the
patient's smoking history. No pneumonia, CHF, nor other acute
cardiopulmonary disease.

Probable old left seventh rib fracture.

Thoracic aortic atherosclerosis.

## 2019-03-05 ENCOUNTER — Encounter: Payer: Self-pay | Admitting: Family Medicine

## 2019-04-14 DIAGNOSIS — M5033 Other cervical disc degeneration, cervicothoracic region: Secondary | ICD-10-CM | POA: Diagnosis not present

## 2019-04-14 DIAGNOSIS — M9902 Segmental and somatic dysfunction of thoracic region: Secondary | ICD-10-CM | POA: Diagnosis not present

## 2019-04-14 DIAGNOSIS — M9901 Segmental and somatic dysfunction of cervical region: Secondary | ICD-10-CM | POA: Diagnosis not present

## 2019-04-14 DIAGNOSIS — G43001 Migraine without aura, not intractable, with status migrainosus: Secondary | ICD-10-CM | POA: Diagnosis not present

## 2019-05-11 ENCOUNTER — Other Ambulatory Visit: Payer: Self-pay | Admitting: Family Medicine

## 2019-05-19 ENCOUNTER — Other Ambulatory Visit: Payer: Self-pay

## 2019-05-19 ENCOUNTER — Ambulatory Visit: Payer: Medicare HMO | Admitting: Cardiovascular Disease

## 2019-05-19 ENCOUNTER — Encounter: Payer: Self-pay | Admitting: Cardiovascular Disease

## 2019-05-19 VITALS — BP 150/90 | HR 73 | Temp 98.0°F | Ht 70.0 in | Wt 221.8 lb

## 2019-05-19 DIAGNOSIS — E7800 Pure hypercholesterolemia, unspecified: Secondary | ICD-10-CM

## 2019-05-19 DIAGNOSIS — I10 Essential (primary) hypertension: Secondary | ICD-10-CM | POA: Diagnosis not present

## 2019-05-19 DIAGNOSIS — I2510 Atherosclerotic heart disease of native coronary artery without angina pectoris: Secondary | ICD-10-CM

## 2019-05-19 NOTE — Assessment & Plan Note (Signed)
History of CAD status post myocardial infarction in 2010 with LAD stenting in [place] using drug-eluting stent.  He saw cardiologist after that in [place] apparently did a stress echo that was normal.  He denies chest pain or shortness of breath.

## 2019-05-19 NOTE — Patient Instructions (Signed)
Medication Instructions:  Your physician recommends that you continue on your current medications as directed. Please refer to the Current Medication list given to you today.  If you need a refill on your cardiac medications before your next appointment, please call your pharmacy.   Lab work: Fasting Lipids and Hepatic Function in the next couple weeks If you have labs (blood work) drawn today and your tests are completely normal, you will receive your results only by: [place] (if you have MyChart) OR A paper copy in the mail If you have any lab test that is abnormal or we need to change your treatment, we will call you to review the results.  Testing/Procedures: NONE  Follow-Up: At [facility], you and your health needs are our priority.  As part of our continuing mission to provide you with exceptional heart care, we have created designated Provider Care Teams.  These Care Teams include your primary Cardiologist (physician) and Advanced Practice Providers (APPs -  Physician Assistants and Nurse Practitioners) who all work together to provide you with the care you need, when you need it. You may see Dr. Gwenlyn Found or one of the following Advanced Practice Providers on your designated Care Team:    Kerin Ransom, PA-C  [place], [place]  Coletta Memos, [place]  Your physician wants you to follow-up in: 1 year with Dr. Gwenlyn Found. You will receive a reminder letter in the mail two months in advance. If you don't receive a letter, please call our office to schedule the follow-up appointment.

## 2019-05-19 NOTE — Progress Notes (Signed)
05/19/2019 [name]   01/20/1951  [id number]  Primary Physician Pickard, Cammie Mcgee, MD Primary Cardiologist: Lorretta Harp MD [name], [place]  HPI:  Todd Patterson is a 69 y.o.  delightful 69 year old mildly overweight married Caucasian male father of 2 daughters, grandfather of 4 grandchildren who was referred by Dr. Dennard Schaumann to be established in our cardiovascular practice for ongoing care.  I last saw him in the office 04/26/2016.  He has a history of treated hypertension and hyperlipidemia as well as discontinue tobacco abuse. He smoked 2 packs a day up until 2010 when he had his heart attack (80 pack years). He also takes several beers a day. He had a heart attack back in 2010 and had LAD stenting in [place] with a drug-eluting stent. He was followed by cardiologist in [place] after that who did a stress echo that was apparently normal. He was relocated from [place] to to be closer to family.  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  His neurologist changed his aspirin to Plavix.  He is fairly active and walks frequently several miles without limitation or symptoms.  He denies chest pain or shortness of breath.  Current Meds  Medication Sig  . atorvastatin (LIPITOR) 80 MG tablet TAKE 1 TABLET BY MOUTH EVERY DAY  . carvedilol (COREG) 6.25 MG tablet TAKE 1 TABLET BY MOUTH TWICE A DAY  . clopidogrel (PLAVIX) 75 MG tablet Take 1 tablet (75 mg total) by mouth daily.  Marland Kitchen latanoprost (XALATAN) 0.005 % ophthalmic solution Place 1 drop into both eyes at bedtime.  Marland Kitchen losartan (COZAAR) 25 MG tablet TAKE 2 TABLETS BY MOUTH EVERY DAY  . [DISCONTINUED] ALPRAZolam (XANAX) 1 MG tablet Take 1-2 tablets thirty minutes prior to MRI.  May take one additional tablet before entering scanner, if needed.  MUST HAVE DRIVER.   Current Facility-Administered Medications for the 05/19/19 encounter (Office Visit) with Lorretta Harp,  MD  Medication  . 0.9 %  sodium chloride infusion  . 0.9 %  sodium chloride infusion     Allergies  Allergen Reactions  . Lisinopril Cough    Social History   Socioeconomic History  . Marital status: Married    Spouse name: Not on file  . Number of children: Not on file  . Years of education: Not on file  . Highest education level: Not on file  Occupational History  . Not on file  Tobacco Use  . Smoking status: Former Smoker    Types: Cigarettes    Quit date: 03/19/2007    Years since quitting: 12.1  . Smokeless tobacco: Never Used  Substance and Sexual Activity  . Alcohol use: Yes    Alcohol/week: 12.0 - 14.0 standard drinks    Types: 12 - 14 Cans of beer per week  . Drug use: No  . Sexual activity: Yes  Other Topics Concern  . Not on file  Social History Narrative  . Not on file   Social Determinants of Health   Financial Resource Strain:   . Difficulty of Paying Living Expenses: Not on file  Food Insecurity:   . Worried About Charity fundraiser in the Last Year: Not on file  . Ran Out of Food in the Last Year: Not on file  Transportation Needs:   . Lack of Transportation (Medical): Not on file  . Lack of Transportation (Non-Medical): Not on file  Physical Activity:   . Days of Exercise per Week: Not on file  . Minutes of Exercise per Session: Not on file  Stress:   . Feeling of Stress : Not on file  Social Connections:   . Frequency of Communication with Friends and Family: Not on file  . Frequency of Social Gatherings with Friends and Family: Not on file  . Attends Religious Services: Not on file  . Active Member of Clubs or Organizations: Not on file  . Attends Archivist Meetings: Not on file  . Marital Status: Not on file  Intimate Partner Violence:   . Fear of Current or Ex-Partner: Not on file  . Emotionally Abused: Not on file  . Physically Abused: Not on file  . Sexually Abused: Not on file     Review of Systems: General:  negative for chills, fever, night sweats or weight changes.  Cardiovascular: negative for chest pain, dyspnea on exertion, edema, orthopnea, palpitations, paroxysmal nocturnal dyspnea or shortness of breath Dermatological: negative for rash Respiratory: negative for cough or wheezing Urologic: negative for hematuria Abdominal: negative for nausea, vomiting, diarrhea, bright red blood per rectum, melena, or hematemesis Neurologic: negative for visual changes, syncope, or dizziness All other systems reviewed and are otherwise negative except as noted above.    Blood pressure (!) 150/90, pulse 73, temperature 98 F (36.7 C), height 5\' 10"  (1.778 m), weight 221 lb 12.8 oz (100.6 kg), SpO2 93 %.  General appearance: alert and no distress Neck: no adenopathy, no carotid bruit, no JVD, supple, symmetrical, trachea midline and thyroid not enlarged, symmetric, no tenderness/mass/nodules Lungs: clear to auscultation bilaterally Heart: regular rate and rhythm, S1, S2 normal, no murmur, click, rub or gallop Extremities: extremities normal, atraumatic, no cyanosis or edema Pulses: 2+ and symmetric Skin: Skin color, texture, turgor normal. No rashes or lesions Neurologic: Alert and oriented X 3, normal strength and tone. Normal symmetric reflexes. Normal coordination and gait  EKG sinus rhythm at 69 with septal Q waves.  I personally reviewed this EKG.  ASSESSMENT AND PLAN:   CAD (coronary artery disease) History of CAD status post myocardial infarction in 2010 with LAD stenting in [place] using drug-eluting stent.  He saw cardiologist after that in [place] apparently did a stress echo that was normal.  He denies chest pain or shortness of breath.  Hyperlipidemia History of hyperlipidemia on high-dose atorvastatin with lipid profile performed 11/18/2018 revealing total cholesterol of 176, LDL of 86 and HDL of 72.  He is not at goal for secondary prevention.  We will recheck a fasting lipid liver  profile.  Essential hypertension History of essential hypertension blood pressure measured today 150/90.  He did not take his blood pressure medications this morning.  He is on carvedilol, and losartan.      Lorretta Harp MD FACP,FACC,FAHA, [facility] 05/19/2019 9:24 AM

## 2019-05-19 NOTE — Assessment & Plan Note (Signed)
History of essential hypertension blood pressure measured today 150/90.  He did not take his blood pressure medications this morning.  He is on carvedilol, and losartan.

## 2019-05-19 NOTE — Assessment & Plan Note (Signed)
History of hyperlipidemia on high-dose atorvastatin with lipid profile performed 11/18/2018 revealing total cholesterol of 176, LDL of 86 and HDL of 72.  He is not at goal for secondary prevention.  We will recheck a fasting lipid liver profile.

## 2019-06-02 ENCOUNTER — Other Ambulatory Visit: Payer: Self-pay | Admitting: Family Medicine

## 2019-06-23 DIAGNOSIS — H401121 Primary open-angle glaucoma, left eye, mild stage: Secondary | ICD-10-CM | POA: Diagnosis not present

## 2019-06-23 DIAGNOSIS — H40001 Preglaucoma, unspecified, right eye: Secondary | ICD-10-CM | POA: Diagnosis not present

## 2019-07-07 DIAGNOSIS — R69 Illness, unspecified: Secondary | ICD-10-CM | POA: Diagnosis not present

## 2019-07-08 DIAGNOSIS — I10 Essential (primary) hypertension: Secondary | ICD-10-CM | POA: Diagnosis not present

## 2019-07-08 DIAGNOSIS — I2510 Atherosclerotic heart disease of native coronary artery without angina pectoris: Secondary | ICD-10-CM | POA: Diagnosis not present

## 2019-07-08 DIAGNOSIS — E7800 Pure hypercholesterolemia, unspecified: Secondary | ICD-10-CM | POA: Diagnosis not present

## 2019-07-08 LAB — HEPATIC FUNCTION PANEL
ALT: 26 IU/L (ref 0–44)
AST: 19 IU/L (ref 0–40)
Albumin: 4.4 g/dL (ref 3.8–4.8)
Alkaline Phosphatase: 70 IU/L (ref 39–117)
Bilirubin Total: 0.6 mg/dL (ref 0.0–1.2)
Bilirubin, Direct: 0.19 mg/dL (ref 0.00–0.40)
Total Protein: 7 g/dL (ref 6.0–8.5)

## 2019-07-08 LAB — LIPID PANEL
Chol/HDL Ratio: 3 ratio (ref 0.0–5.0)
Cholesterol, Total: 192 mg/dL (ref 100–199)
HDL: 65 mg/dL (ref >39)
LDL Chol Calc (NIH): 91 mg/dL (ref 0–99)
Triglycerides: 216 mg/dL — ABNORMAL HIGH (ref 0–149)
VLDL Cholesterol Cal: 36 mg/dL (ref 5–40)

## 2019-07-09 ENCOUNTER — Other Ambulatory Visit: Payer: Self-pay

## 2019-07-09 DIAGNOSIS — I2510 Atherosclerotic heart disease of native coronary artery without angina pectoris: Secondary | ICD-10-CM

## 2019-07-09 DIAGNOSIS — E7800 Pure hypercholesterolemia, unspecified: Secondary | ICD-10-CM

## 2019-07-09 MED ORDER — EZETIMIBE 10 MG PO TABS: 10.0000 mg | ORAL_TABLET | Freq: Every day | ORAL | 3 refills | Status: DC

## 2019-07-13 ENCOUNTER — Telehealth: Payer: Self-pay | Admitting: Cardiovascular Disease

## 2019-07-13 DIAGNOSIS — E7800 Pure hypercholesterolemia, unspecified: Secondary | ICD-10-CM

## 2019-07-13 NOTE — Telephone Encounter (Signed)
   Pt c/o medication issue:  1. Name of Medication: ezetimibe (ZETIA) 10 MG tablet  2. How are you currently taking this medication (dosage and times per day)? Take 1 tablet (10 mg total) by mouth daily.  3. Are you having a reaction (difficulty breathing--STAT)?   4. What is your medication issue? Pt said Dr. Gwenlyn Found newly prescribed this drug. He went to pick it up at the pharmacy and his insurance won't pay for it and it will cost him $174. He said he can't afford it and asking if he can get alternative with lower cost  Please advise

## 2019-07-14 NOTE — Telephone Encounter (Signed)
Order for referal to Dr.Hilty's lipid clinic placed.

## 2019-07-14 NOTE — Telephone Encounter (Signed)
Called and spoke with pt, notified of lipid clinic referral. Pt stated he is just going to call his PCP to see what to do. No other questions at this time.

## 2019-07-14 NOTE — Telephone Encounter (Signed)
Please refer to Dr. Lysbeth Penner lipid clinic for further evaluation and treatment

## 2019-07-14 NOTE — Telephone Encounter (Signed)
Spoke to patient he stated he will see Dr.Hilty in his lipid clinic.Advised scheduler will call back with appointment.

## 2019-07-21 DIAGNOSIS — H401121 Primary open-angle glaucoma, left eye, mild stage: Secondary | ICD-10-CM | POA: Diagnosis not present

## 2019-07-21 DIAGNOSIS — H40001 Preglaucoma, unspecified, right eye: Secondary | ICD-10-CM | POA: Diagnosis not present

## 2019-08-10 ENCOUNTER — Other Ambulatory Visit: Payer: Self-pay | Admitting: Neurology

## 2019-08-31 DIAGNOSIS — M5033 Other cervical disc degeneration, cervicothoracic region: Secondary | ICD-10-CM | POA: Diagnosis not present

## 2019-08-31 DIAGNOSIS — M9902 Segmental and somatic dysfunction of thoracic region: Secondary | ICD-10-CM | POA: Diagnosis not present

## 2019-08-31 DIAGNOSIS — M9901 Segmental and somatic dysfunction of cervical region: Secondary | ICD-10-CM | POA: Diagnosis not present

## 2019-08-31 DIAGNOSIS — G43001 Migraine without aura, not intractable, with status migrainosus: Secondary | ICD-10-CM | POA: Diagnosis not present

## 2019-09-02 ENCOUNTER — Ambulatory Visit: Payer: Medicare HMO | Admitting: Neurology

## 2019-09-02 ENCOUNTER — Other Ambulatory Visit: Payer: Self-pay | Admitting: *Deleted

## 2019-09-02 ENCOUNTER — Ambulatory Visit (INDEPENDENT_AMBULATORY_CARE_PROVIDER_SITE_OTHER): Payer: Medicare HMO | Admitting: Family Medicine

## 2019-09-02 ENCOUNTER — Telehealth: Payer: Self-pay | Admitting: Neurology

## 2019-09-02 ENCOUNTER — Encounter: Payer: Self-pay | Admitting: Neurology

## 2019-09-02 ENCOUNTER — Other Ambulatory Visit: Payer: Self-pay

## 2019-09-02 VITALS — BP 140/80 | HR 58 | Temp 98.5°F | Ht 70.0 in | Wt 217.0 lb

## 2019-09-02 VITALS — BP 162/88 | HR 69 | Ht 70.0 in | Wt 217.0 lb

## 2019-09-02 DIAGNOSIS — M9902 Segmental and somatic dysfunction of thoracic region: Secondary | ICD-10-CM | POA: Diagnosis not present

## 2019-09-02 DIAGNOSIS — H4922 Sixth [abducent] nerve palsy, left eye: Secondary | ICD-10-CM | POA: Diagnosis not present

## 2019-09-02 DIAGNOSIS — G43001 Migraine without aura, not intractable, with status migrainosus: Secondary | ICD-10-CM | POA: Diagnosis not present

## 2019-09-02 DIAGNOSIS — M9901 Segmental and somatic dysfunction of cervical region: Secondary | ICD-10-CM | POA: Diagnosis not present

## 2019-09-02 DIAGNOSIS — E538 Deficiency of other specified B group vitamins: Secondary | ICD-10-CM | POA: Diagnosis not present

## 2019-09-02 DIAGNOSIS — R799 Abnormal finding of blood chemistry, unspecified: Secondary | ICD-10-CM | POA: Diagnosis not present

## 2019-09-02 DIAGNOSIS — I639 Cerebral infarction, unspecified: Secondary | ICD-10-CM | POA: Diagnosis not present

## 2019-09-02 DIAGNOSIS — M5033 Other cervical disc degeneration, cervicothoracic region: Secondary | ICD-10-CM | POA: Diagnosis not present

## 2019-09-02 DIAGNOSIS — R7309 Other abnormal glucose: Secondary | ICD-10-CM | POA: Diagnosis not present

## 2019-09-02 DIAGNOSIS — R7989 Other specified abnormal findings of blood chemistry: Secondary | ICD-10-CM | POA: Diagnosis not present

## 2019-09-02 MED ORDER — ALPRAZOLAM 0.5 MG PO TABS
ORAL_TABLET | ORAL | 0 refills | Status: DC
Start: 2019-09-02 — End: 2019-09-21

## 2019-09-02 NOTE — Progress Notes (Addendum)
Subjective:    Patient ID: Todd Patterson, male    DOB: [birth year], 69 y.o.   MRN: [id number]  HPI  09/2017 Patient is a very pleasant 69 year old white male whose past medical history is significant for myocardial infarction in 2008. Patient underwent PTCA with stenting.  Patient states that he had a bare-metal stent. He was continued on aspirin and Plavix for 2 years and then was instructed to continue aspirin thereafter. He also has a history of hyperlipidemia and hypertension.  2 weeks ago, the patient developed double vision.  If he closes his right eye, the double vision will go away.  If he opens the right eye, the double vision is present.  It is horizontal double vision.  He states that it is constant as long as his right eye is open.  It does not wax and wane throughout the day.  He denies any trouble swallowing.  He denies any muscle weakness.  He denies any pain in his.  He does have some mild headache.  This is diffuse in nature.  It is located mainly in the back of his neck and in his sinus area.  He denies any pain or tenderness over his temporal artery.  He denies any jaw claudication.  He denies any pain in his shoulders or in his hips.  He has no past medical history for migraines.  He denies any symptoms of multiple sclerosis.  On physical exam, the patient is unable to abduct his right eye on lateral gaze to the right.  He is able to abduct his right eye on lateral gaze to the left.  He has normal upward gaze.  He has normal downward gaze.  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  Differential diagnosis includes idiopathic 6 cranial nerve palsy, internuclear ophthalmoplegia, ischemic insult to the 6 cranial nerve or his nucleus, myasthenia gravis although unlikely based on his exam, temporal arteritis although unlikely based on  his exam, Lyme disease with an isolated 6 cranial nerve palsy.  I will begin the work-up by obtaining an MRI of the brain to rule out ischemic insult to the 6 cranial nerve for his nucleus.  I will consult neurology for second opinion.  I suspect that the patient is either suffered an ischemic insult to the 6 cranial nerve or that this is idiopathic 6 cranial nerve palsy that may resolve spontaneously on its own.  I will screen for myasthenia gravis by checking acetylcholine receptor blocking antibodies, I will check for giant cell arteritis with a sedimentation rate/CRP, I will check a TSH to rule out thyroid abnormalities and also check Lyme serologies.   09/02/19 Work-up at that time was unremarkable.  Ultimately patient saw Dr. Krista Blue and cause was felt to be microvascular in nature.  Symptoms have now returned.  However now symptoms are present on the left side.  He states for the last 3 days he has had diplopia.  It is affecting his left eye.  He is unable to laterally abduct his left eye.  If he looks to the left he develops double vision.  If he looks to the right his vision is normal.  Patient has a visible palsy with lateral gaze in the left eye.  Palsy is isolated to the 6th cranial nerve.  Patient states that for the last 3 days he  is also had a pressure-like headache at the base of his neck radiating up the left side of his face and behind his left eye.  This is a recurrent isolated 6 cranial nerve palsy however it is now on the contralateral side.  I believe that this is very unusual and I am not convinced that this is vascular in nature.  I suspect increased intracranial pressure given his headache Past Medical History:  Diagnosis Date   Colon polyps    colonoscopy 2018, recommended repeat in 1 year due to numbe rof polyps.    Hyperlipidemia    Hypertension    Hyperthyroidism    hyperparthyroid   Myocardial infarction [facility]) 2009   Has 1 stent   Past Surgical History:  Procedure  Laterality Date   APPENDECTOMY     CORONARY ANGIOPLASTY WITH STENT PLACEMENT  2009   1 stent   PARATHYROIDECTOMY N/A 07/04/2016   Procedure: PARATHYROIDECTOMY;  Surgeon: Jackolyn Confer, MD;  Location: [facility];  Service: General;  Laterality: N/A;   TONSILLECTOMY     Current Outpatient Medications on File Prior to Visit  Medication Sig Dispense Refill   atorvastatin (LIPITOR) 80 MG tablet TAKE 1 TABLET BY MOUTH EVERY DAY 90 tablet 1   carvedilol (COREG) 6.25 MG tablet TAKE 1 TABLET BY MOUTH TWICE A DAY 180 tablet 1   clopidogrel (PLAVIX) 75 MG tablet Take 1 tablet (75 mg total) by mouth daily. Please request future refills from PCP. 90 tablet 1   ezetimibe (ZETIA) 10 MG tablet Take 1 tablet (10 mg total) by mouth daily. 90 tablet 3   latanoprost (XALATAN) 0.005 % ophthalmic solution Place 1 drop into both eyes at bedtime.     losartan (COZAAR) 25 MG tablet TAKE 2 TABLETS BY MOUTH EVERY DAY 180 tablet 3   Current Facility-Administered Medications on File Prior to Visit  Medication Dose Route Frequency Provider Last Rate Last Admin   0.9 %  sodium chloride infusion  500 mL Intravenous Continuous Nandigam, Kavitha V, MD       0.9 %  sodium chloride infusion  500 mL Intravenous Continuous Nandigam, Venia Minks, MD       Allergies  Allergen Reactions   Lisinopril Cough   Social History   Socioeconomic History   Marital status: Married    Spouse name: Not on file   Number of children: Not on file   Years of education: Not on file   Highest education level: Not on file  Occupational History   Not on file  Tobacco Use   Smoking status: Former Smoker    Types: Cigarettes    Quit date: 03/19/2007    Years since quitting: 12.4   Smokeless tobacco: Never Used  Vaping Use   Vaping Use: Never used  Substance and Sexual Activity   Alcohol use: Yes    Alcohol/week: 12.0 - 14.0 standard drinks    Types: 12 - 14 Cans of beer per week   Drug use: No   Sexual activity:  Yes  Other Topics Concern   Not on file  Social History Narrative   Not on file   Social Determinants of Health   Financial Resource Strain:    Difficulty of Paying Living Expenses:   Food Insecurity:    Worried About Charity fundraiser in the Last Year:    Arboriculturist in the Last Year:   Transportation Needs:    Film/video editor (Medical):    Lack of Transportation (  Non-Medical):   Physical Activity:    Days of Exercise per Week:    Minutes of Exercise per Session:   Stress:    Feeling of Stress :   Social Connections:    Frequency of Communication with Friends and Family:    Frequency of Social Gatherings with Friends and Family:    Attends Religious Services:    Active Member of Clubs or Organizations:    Attends Music therapist:    Marital Status:   Intimate Partner Violence:    Fear of Current or Ex-Partner:    Emotionally Abused:    Physically Abused:    Sexually Abused:    Family History  Problem Relation Age of Onset   Dementia Mother    Heart disease Father    Cancer Sister    Breast cancer Sister       Review of Systems  Neurological: Positive for headaches.  All other systems reviewed and are negative.      Objective:   Physical Exam Vitals reviewed.  Constitutional:      General: He is not in acute distress.    Appearance: He is well-developed. He is not diaphoretic.  HENT:     Head: Normocephalic and atraumatic.     Right Ear: External ear normal.     Left Ear: External ear normal.     Nose: Nose normal.     Mouth/Throat:     Pharynx: No oropharyngeal exudate.  Eyes:     General: No scleral icterus.       Right eye: No discharge.        Left eye: No discharge.     Extraocular Movements:     Right eye: Abnormal extraocular motion present. No nystagmus.     Left eye: Normal extraocular motion and no nystagmus.     Conjunctiva/sclera: Conjunctivae normal.     Pupils: Pupils are equal,  round, and reactive to light.  Neck:     Thyroid: No thyromegaly.     Vascular: No JVD.     Trachea: No tracheal deviation.  Cardiovascular:     Rate and Rhythm: Normal rate and regular rhythm.     Heart sounds: Normal heart sounds. No murmur heard.  No friction rub. No gallop.   Pulmonary:     Effort: Pulmonary effort is normal. No respiratory distress.     Breath sounds: Normal breath sounds. No stridor. No wheezing or rales.  Chest:     Chest wall: No tenderness.  Abdominal:     General: Bowel sounds are normal. There is no distension.     Palpations: Abdomen is soft. There is no mass.     Tenderness: There is no abdominal tenderness. There is no guarding or rebound.  Musculoskeletal:        General: No tenderness or deformity. Normal range of motion.     Cervical back: Normal range of motion and neck supple.  Lymphadenopathy:     Cervical: No cervical adenopathy.  Skin:    General: Skin is warm.     Coloration: Skin is not pale.     Findings: No erythema or rash.  Neurological:     Mental Status: He is alert and oriented to person, place, and time.     Cranial Nerves: Cranial nerve deficit present.     Sensory: No sensory deficit.     Motor: No tremor, atrophy or abnormal muscle tone.     Coordination: Coordination normal.     Gait:  Gait normal.     Deep Tendon Reflexes: Reflexes are normal and symmetric.  Psychiatric:        Behavior: Behavior normal.        Thought Content: Thought content normal.        Judgment: Judgment normal.           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  2 years ago he had an isolated right cranial nerve palsy in the 6 the nerve.  The recurrent nature on the contralateral side makes me suspicious about possible increased intracranial pressure particular given his headaches.  This would make me suspicious about possible pseudotumor cerebri or possibly an obstruction in the  outflow of cerebrospinal fluid due to a Chiari Arnold malformation.  Therefore I would like to get a second opinion with his neurologist.  I have placed a call to her office in an effort to try to get him worked in for an evaluation.  Dr Krista Blue graciously agreed to see him this afternoon at [facility]  and I certainly appreciate it.

## 2019-09-02 NOTE — Progress Notes (Signed)
PATIENT: Todd Patterson DOB: October 17, 1950  Chief Complaint  Patient presents with  . right VI nerve palsy    He is here with his wife, Byrd Hesselbach, for re-evaluation of his right VI nerve palsy.      HISTORICAL  Todd Patterson is a 69 year old male, seen in refer by his primary care doctor Jenna Luo for evaluation of diplopia, initial evaluation was on September 22, 2017,  He has past medical history of hypertension, hyperlipidemia, coronary artery disease, parathyroidectomy for benign parathyroid tumor,  He still works at a golf course, on September 01, 2017, he had sudden onset double vision while driving her car home, he does have double vision side-by-side, which has been persistent since his onset, he denies vertigo, lateralized motor deficit or sensory loss, he denies gait abnormality.  He presented to the emergency room on September 05, 2017, CT angiogram of head and neck showed 40 to 50% stenosis of bilateral carotid artery, additional mild to moderate atherosclerotic disease,  Laboratory evaluations showed normal TSH, C-reactive protein, ESR, negative acetylcholine receptor antibody, mild elevated WBC of 14.3, normal BMP.  Virtual Visit via Video on Aug 13, 2018  His symptoms has much improved, his double vision last about 70 days, now back to baseline,  I personally reviewed MRI brain in July 2019.  Mild small vessel disease, no acute abnormality,  He was taking aspirin 325 mg daily when he developed right 6th nerve palsy, switch to Plavix 75 mg since, etiology was considered should be right 6th nerve ischemic event  UPDATE September 02 2019: On August 29 2019 he developed severe left-sided headaches, also complained of neck pain, left side pressure pain, on August 30, 2019, noticed double vision, noticeable looking for distance, much improved looking closer,  He denies dizziness, denies lateralized motor or sensory deficit  Reviewed previous MRI in July 2019 no acute abnormality, mild  supratentorium small vessel disease  CT angiogram of head and neck showed 40 to 50% stenosis at bilateral carotid artery bifurcation  REVIEW OF SYSTEMS: Full 14 system review of systems performed and notable only for as above All other review of systems were negative.  ALLERGIES: Allergies  Allergen Reactions  . Lisinopril Cough    HOME MEDICATIONS: Current Outpatient Medications  Medication Sig Dispense Refill  . atorvastatin (LIPITOR) 80 MG tablet TAKE 1 TABLET BY MOUTH EVERY DAY 90 tablet 1  . carvedilol (COREG) 6.25 MG tablet TAKE 1 TABLET BY MOUTH TWICE A DAY 180 tablet 1  . clopidogrel (PLAVIX) 75 MG tablet Take 1 tablet (75 mg total) by mouth daily. Please request future refills from PCP. 90 tablet 1  . latanoprost (XALATAN) 0.005 % ophthalmic solution Place 1 drop into both eyes at bedtime.    Marland Kitchen losartan (COZAAR) 25 MG tablet TAKE 2 TABLETS BY MOUTH EVERY DAY 180 tablet 3  . timolol (TIMOPTIC) 0.5 % ophthalmic solution Place 1 drop into both eyes 2 (two) times daily.     No current facility-administered medications for this visit.    PAST MEDICAL HISTORY: Past Medical History:  Diagnosis Date  . Colon polyps    colonoscopy 2018, recommended repeat in 1 year due to numbe rof polyps.   . Hyperlipidemia   . Hypertension   . Hyperthyroidism    hyperparthyroid  . Myocardial infarction [facility]) 2009   Has 1 stent    PAST SURGICAL HISTORY: Past Surgical History:  Procedure Laterality Date  . APPENDECTOMY    . CORONARY ANGIOPLASTY WITH STENT PLACEMENT  2009   1 stent  . PARATHYROIDECTOMY N/A 07/04/2016   Procedure: PARATHYROIDECTOMY;  Surgeon: Avel Peace, MD;  Location: [facility];  Service: General;  Laterality: N/A;  . TONSILLECTOMY      FAMILY HISTORY: Family History  Problem Relation Age of Onset  . Dementia Mother   . Heart disease Father   . Cancer Sister   . Breast cancer Sister     SOCIAL HISTORY: Social History   Socioeconomic History  . Marital  status: Married    Spouse name: Not on file  . Number of children: Not on file  . Years of education: Not on file  . Highest education level: Not on file  Occupational History  . Not on file  Tobacco Use  . Smoking status: Former Smoker    Types: Cigarettes    Quit date: 03/19/2007    Years since quitting: 12.4  . Smokeless tobacco: Never Used  Vaping Use  . Vaping Use: Never used  Substance and Sexual Activity  . Alcohol use: Yes    Alcohol/week: 12.0 - 14.0 standard drinks    Types: 12 - 14 Cans of beer per week  . Drug use: No  . Sexual activity: Yes  Other Topics Concern  . Not on file  Social History Narrative  . Not on file   Social Determinants of Health   Financial Resource Strain:   . Difficulty of Paying Living Expenses:   Food Insecurity:   . Worried About Programme researcher, broadcasting/film/video in the Last Year:   . Barista in the Last Year:   Transportation Needs:   . Freight forwarder (Medical):   Marland Kitchen Lack of Transportation (Non-Medical):   Physical Activity:   . Days of Exercise per Week:   . Minutes of Exercise per Session:   Stress:   . Feeling of Stress :   Social Connections:   . Frequency of Communication with Friends and Family:   . Frequency of Social Gatherings with Friends and Family:   . Attends Religious Services:   . Active Member of Clubs or Organizations:   . Attends Banker Meetings:   Marland Kitchen Marital Status:   Intimate Partner Violence:   . Fear of Current or Ex-Partner:   . Emotionally Abused:   Marland Kitchen Physically Abused:   . Sexually Abused:      PHYSICAL EXAM   Vitals:   09/02/19 1544  BP: (!) 162/88  Pulse: 69  Weight: 217 lb (98.4 kg)  Height: 5\' 10"  (1.778 m)   Not recorded     Body mass index is 31.14 kg/m.  PHYSICAL EXAMNIATION:  Gen: NAD, conversant, well nourised, well groomed                     Cardiovascular: Regular rate rhythm, no peripheral edema, warm, nontender. Eyes: Conjunctivae clear without exudates  or hemorrhage Neck: Supple, no carotid bruits. Pulmonary: Clear to auscultation bilaterally   NEUROLOGICAL EXAM:  MENTAL STATUS: Speech:    Speech is normal; fluent and spontaneous with normal comprehension.  Cognition:     Orientation to time, place and person     Normal recent and remote memory     Normal Attention span and concentration     Normal Language, naming, repeating,spontaneous speech     Fund of knowledge   CRANIAL NERVES: CN II: Visual fields are full to confrontation. Pupils are round equal and briskly reactive to light. CN III, IV, VI: He  has left eye abduction,. CN V: Facial sensation is intact to light touch CN VII: Face is symmetric with normal eye closure  CN VIII: Hearing is normal to causal conversation. CN IX, X: Phonation is normal. CN XI: Head turning and shoulder shrug are intact  MOTOR: There is no pronator drift of out-stretched arms. Muscle bulk and tone are normal. Muscle strength is normal.  REFLEXES: Reflexes are 2+ and symmetric at the biceps, triceps, knees, and ankles. Plantar responses are flexor.  SENSORY: Intact to light touch, pinprick and vibratory sensation are intact in fingers and toes.  COORDINATION: There is no trunk or limb dysmetria noted.  GAIT/STANCE: Posture is normal. Gait is steady with normal steps, base, arm swing, and turning. Heel and toe walking are normal. Tandem gait is normal.  Romberg is absent.   DIAGNOSTIC DATA (LABS, IMAGING, TESTING) - I reviewed patient records, labs, notes, testing and imaging myself where available.   ASSESSMENT AND PLAN  Eliga Arvie is a 69 y.o. male   History of right 6th nerve palsy in July 2019 Evidence of left 6th nerve palsy since June 2021  Previous right 6th nerve palsy was stopped due to ischemic event, likely the same etiology for left 6th nerve palsy  He does have vascular risk factor of hypertension, hyperlipidemia, obesity,  Laboratory evaluation to rule out  neuromuscular junctional disorder  MRI of the brain with without contrast to rule out stroke, cranial base pathology  CT angiogram of head and neck   [name], M.D. Ph.D.  [facility] [address] [place], [place] [address] Ph: [phone] Fax: [phone]  CC: [name], MD  Referring Physician

## 2019-09-02 NOTE — Telephone Encounter (Signed)
I called Todd Patterson back and this patient will be coming to our office today at 3:30pm.

## 2019-09-02 NOTE — Telephone Encounter (Signed)
Urgent

## 2019-09-02 NOTE — Telephone Encounter (Signed)
Patient is at Dr. Dennard Schaumann office right now and they would like him to be seen urgently by Dr. Krista Blue for his 6th nerve palsy. The want to know when he will be seen because he is at their office right now. Dr. Dennard Schaumann does not mind speaking with Dr. Krista Blue. To call back speak with Larene Beach at [phone].

## 2019-09-03 ENCOUNTER — Telehealth: Payer: Self-pay | Admitting: Neurology

## 2019-09-03 ENCOUNTER — Other Ambulatory Visit: Payer: Self-pay | Admitting: Family Medicine

## 2019-09-03 NOTE — Telephone Encounter (Signed)
[employer] medicare order sent to [facility]. They will obtain the auth and reach out to the patient to schedule.  °

## 2019-09-05 ENCOUNTER — Telehealth: Payer: Self-pay | Admitting: Neurology

## 2019-09-05 DIAGNOSIS — R430 Anosmia: Secondary | ICD-10-CM

## 2019-09-05 DIAGNOSIS — H4921 Sixth [abducent] nerve palsy, right eye: Secondary | ICD-10-CM

## 2019-09-05 NOTE — Telephone Encounter (Signed)
Please change to MRI of the brain with without contrast

## 2019-09-06 ENCOUNTER — Telehealth: Payer: Self-pay | Admitting: Neurology

## 2019-09-06 DIAGNOSIS — M9902 Segmental and somatic dysfunction of thoracic region: Secondary | ICD-10-CM | POA: Diagnosis not present

## 2019-09-06 DIAGNOSIS — M9901 Segmental and somatic dysfunction of cervical region: Secondary | ICD-10-CM | POA: Diagnosis not present

## 2019-09-06 DIAGNOSIS — G43001 Migraine without aura, not intractable, with status migrainosus: Secondary | ICD-10-CM | POA: Diagnosis not present

## 2019-09-06 DIAGNOSIS — M5033 Other cervical disc degeneration, cervicothoracic region: Secondary | ICD-10-CM | POA: Diagnosis not present

## 2019-09-06 LAB — ACETYLCHOLINE RECEPTOR, BINDING: AChR Binding Ab, Serum: <0.03 nmol/L (ref 0.00–0.24)

## 2019-09-06 LAB — C-REACTIVE PROTEIN: CRP: <1 mg/L (ref 0–10)

## 2019-09-06 LAB — ANA W/REFLEX IF POSITIVE: Anti Nuclear Antibody (ANA): NEGATIVE

## 2019-09-06 LAB — HGB A1C W/O EAG: Hgb A1c MFr Bld: 5.6 % (ref 4.8–5.6)

## 2019-09-06 LAB — SEDIMENTATION RATE: Sed Rate: 6 mm/hr (ref 0–30)

## 2019-09-06 LAB — TSH: TSH: 2.82 u[IU]/mL (ref 0.450–4.500)

## 2019-09-06 LAB — VITAMIN B12: Vitamin B-12: 520 pg/mL (ref 232–1245)

## 2019-09-06 LAB — RPR: RPR Ser Ql: NONREACTIVE

## 2019-09-06 MED ORDER — MELOXICAM 7.5 MG PO TABS
7.5000 mg | ORAL_TABLET | Freq: Every day | ORAL | 6 refills | Status: DC | PRN
Start: 2019-09-06 — End: 2019-09-21

## 2019-09-06 NOTE — Telephone Encounter (Signed)
Right now there is an order in there for a MRI brain wo contrast? You want him to have a MRI Brain w/wo contrast?? I'll need a new order put in.

## 2019-09-06 NOTE — Telephone Encounter (Signed)
Noted, the order has been switched and he is scheduled at [facility] for 09/09/19.

## 2019-09-06 NOTE — Telephone Encounter (Signed)
Orders Placed This Encounter  Procedures   MR BRAIN/IAC W WO CONTRAST      

## 2019-09-06 NOTE — Telephone Encounter (Signed)
Meds ordered this encounter  Medications  . meloxicam (MOBIC) 7.5 MG tablet    Sig: Take 1 tablet (7.5 mg total) by mouth daily as needed for pain.    Dispense:  30 tablet    Refill:  6

## 2019-09-06 NOTE — Telephone Encounter (Signed)
Pt called and stated that his Mobic 7.5mg  was not called in for him to the [facility] on [address] [place]. Please advise.

## 2019-09-06 NOTE — Addendum Note (Signed)
Addended by: [name] on: 09/06/2019 07:48 AM   Modules accepted: Orders

## 2019-09-06 NOTE — Progress Notes (Signed)
Mr. Todd Patterson:  Extensive laboratory evaluations were normal.  [name], M.D. Ph.D.  [facility] [place], [place] [address] Phone: [phone] Fax:      [phone]

## 2019-09-08 DIAGNOSIS — M5033 Other cervical disc degeneration, cervicothoracic region: Secondary | ICD-10-CM | POA: Diagnosis not present

## 2019-09-08 DIAGNOSIS — M9901 Segmental and somatic dysfunction of cervical region: Secondary | ICD-10-CM | POA: Diagnosis not present

## 2019-09-08 DIAGNOSIS — G43001 Migraine without aura, not intractable, with status migrainosus: Secondary | ICD-10-CM | POA: Diagnosis not present

## 2019-09-08 DIAGNOSIS — M9902 Segmental and somatic dysfunction of thoracic region: Secondary | ICD-10-CM | POA: Diagnosis not present

## 2019-09-09 ENCOUNTER — Ambulatory Visit
Admission: RE | Admit: 2019-09-09 | Discharge: 2019-09-09 | Disposition: A | Discharge status: Home / Self Care | Payer: Medicare HMO | Source: Ambulatory Visit | Attending: Neurology | Admitting: Neurology

## 2019-09-09 ENCOUNTER — Other Ambulatory Visit: Payer: Self-pay

## 2019-09-09 ENCOUNTER — Other Ambulatory Visit: Payer: Medicare HMO

## 2019-09-09 DIAGNOSIS — R430 Anosmia: Secondary | ICD-10-CM

## 2019-09-09 DIAGNOSIS — I639 Cerebral infarction, unspecified: Secondary | ICD-10-CM

## 2019-09-09 MED ORDER — IOPAMIDOL (ISOVUE-370) INJECTION 76%
75.0000 mL | Freq: Once | INTRAVENOUS | Status: AC | PRN
  Administered 2019-09-09: 75 mL via INTRAVENOUS

## 2019-09-09 MED ORDER — GADOBENATE DIMEGLUMINE 529 MG/ML IV SOLN
20.0000 mL | Freq: Once | INTRAVENOUS | Status: AC | PRN
  Administered 2019-09-09: 20 mL via INTRAVENOUS

## 2019-09-13 ENCOUNTER — Telehealth: Payer: Self-pay | Admitting: Neurology

## 2019-09-13 DIAGNOSIS — M9901 Segmental and somatic dysfunction of cervical region: Secondary | ICD-10-CM | POA: Diagnosis not present

## 2019-09-13 DIAGNOSIS — M9902 Segmental and somatic dysfunction of thoracic region: Secondary | ICD-10-CM | POA: Diagnosis not present

## 2019-09-13 DIAGNOSIS — G43001 Migraine without aura, not intractable, with status migrainosus: Secondary | ICD-10-CM | POA: Diagnosis not present

## 2019-09-13 DIAGNOSIS — M5033 Other cervical disc degeneration, cervicothoracic region: Secondary | ICD-10-CM | POA: Diagnosis not present

## 2019-09-13 NOTE — Telephone Encounter (Signed)
IMPRESSION: 1. CT head negative for acute intracranial abnormality 2. Atherosclerotic calcification at the carotid bifurcation bilaterally. The patient moved during scanning through this area blurring image quality however no flow limiting stenosis is identified. No change from the prior CT 10/2017 3. Dominant left vertebral artery without significant stenosis. Very small right vertebral artery ends at the skull base. This is unchanged from CTA of 09/05/2017 IMPRESSION: This MRI of the brain with and without contrast with added attention to the internal auditory canals shows the following: 1.   Few scattered punctate T2/FLAIR hyperintense foci in the hemispheres consistent with age-appropriate minimal chronic microvascular ischemic change. 2.   The optic nerve sheaths arel widened, similar in appearance to the 2019 MRI.  Since the pituitary gland has a normal contour, this is more likely to be incidental than due to elevated intracranial pressures.  The orbits were otherwise normal 3.   Borderline vertebrobasilar dolichoectasia.   4.   There are no acute findings and there is a normal enhancement pattern.  Please call patient, MRI of the brain showed no acute abnormality, no significant change compared to previous scan in 2019, mild supratentorium small vessel disease  CT angiogram showed atherosclerotic calcification at the carotid bifurcation bilaterally, no significant stenosis,  Please check to see if his symptoms has improved

## 2019-09-13 NOTE — Telephone Encounter (Signed)
He went for a massage this past Saturday which relieved some of the muscle pains her was having in his neck. His headaches have improved. He still has some discomfort around his left eye and issues with double vision.  He would like to know if there is anything further to be done for is vision or does he have to just give it time.

## 2019-09-18 ENCOUNTER — Other Ambulatory Visit: Payer: Medicare HMO

## 2019-09-21 ENCOUNTER — Telehealth (INDEPENDENT_AMBULATORY_CARE_PROVIDER_SITE_OTHER): Payer: Medicare HMO | Admitting: Internal Medicine

## 2019-09-21 ENCOUNTER — Encounter: Payer: Self-pay | Admitting: Internal Medicine

## 2019-09-21 VITALS — Wt 214.0 lb

## 2019-09-21 DIAGNOSIS — E785 Hyperlipidemia, unspecified: Secondary | ICD-10-CM | POA: Diagnosis not present

## 2019-09-21 DIAGNOSIS — I2510 Atherosclerotic heart disease of native coronary artery without angina pectoris: Secondary | ICD-10-CM | POA: Diagnosis not present

## 2019-09-21 NOTE — Progress Notes (Signed)
Virtual Visit via Video Note   This visit type was conducted due to national recommendations for restrictions regarding the COVID-19 Pandemic (e.g. social distancing) in an effort to limit this patient's exposure and mitigate transmission in our community.  Due to his co-morbid illnesses, this patient is at least at moderate risk for complications without adequate follow up.  This format is felt to be most appropriate for this patient at this time.  All issues noted in this document were discussed and addressed.  A limited physical exam was performed with this format.  Please refer to the patient's chart for his consent to telehealth for [facility].   Evaluation Performed:  Caregility video visit  Date:  09/21/2019   ID:  Davin Archuletta, DOB 03/09/1951, MRN [id number]  Patient Location:  [place] [address]  Provider location:   [address], [address] [place], [place] [address]  PCP:  [name], MD  Cardiologist:  No primary care provider on file. Electrophysiologist:  None   Chief Complaint:  Dyslipidemia  History of Present Illness:    Kemar Pandit is a 69 y.o. male who presents via audio/video conferencing for a telehealth visit today.  This is a 69 yo male with a history of MI in 2010, 80-pack-year smoking history and alcohol use, prior PCI to the LAD with a drug-eluting stent, followed by Dr. Gwenlyn Found for the past several years.  He apparently works at a golf course.  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 Krista Blue.  He has had another episode but has had 2 rounds of cerebral imaging, both of which have been negative.  He feels that it is a vertebral problem with his neck and is seen chiropractor and follows up with his ophthalmologist very soon.  In the past he had been on low-dose aspirin with but was switched to Plavix.  He does not like the Plavix because of  extensive bruising.  Up edema on high-dose atorvastatin 80 mg daily but LDL was still above target in April at 91.  He says his diet was not optimal to time and his triglycerides accordingly were elevated but had previously been better.  Since then he said he has changed his diet more and is exercising more regularly and walks about 12 miles a day or so.  The patient does not have symptoms concerning for COVID-19 infection (fever, chills, cough, or new SHORTNESS OF BREATH).    Prior CV studies:   The following studies were reviewed today:  Chart review, lab work  PMHx:  Past Medical History:  Diagnosis Date  . Colon polyps    colonoscopy 2018, recommended repeat in 1 year due to numbe rof polyps.   . Hyperlipidemia   . Hypertension   . Hyperthyroidism    hyperparthyroid  . Myocardial infarction [facility]) 2009   Has 1 stent    Past Surgical History:  Procedure Laterality Date  . APPENDECTOMY    . CORONARY ANGIOPLASTY WITH STENT PLACEMENT  2009   1 stent  . PARATHYROIDECTOMY N/A 07/04/2016   Procedure: PARATHYROIDECTOMY;  Surgeon: Jackolyn Confer, MD;  Location: [facility] ORS;  Service: General;  Laterality: N/A;  . TONSILLECTOMY      FAMHx:  Family History  Problem Relation Age of Onset  . Dementia Mother   . Heart disease Father   . Cancer Sister   . Breast cancer  Sister     SOCHx:   reports that he quit smoking about 12 years ago. His smoking use included cigarettes. He has never used smokeless tobacco. He reports current alcohol use of about 12.0 - 14.0 standard drinks of alcohol per week. He reports that he does not use drugs.  ALLERGIES:  Allergies  Allergen Reactions  . Lisinopril Cough    MEDS:  Current Meds  Medication Sig  . atorvastatin (LIPITOR) 80 MG tablet TAKE 1 TABLET BY MOUTH EVERY DAY  . carvedilol (COREG) 6.25 MG tablet TAKE 1 TABLET BY MOUTH TWICE A DAY  . clopidogrel (PLAVIX) 75 MG tablet Take 1 tablet (75 mg total) by mouth daily. Please request future  refills from PCP.  Marland Kitchen latanoprost (XALATAN) 0.005 % ophthalmic solution Place 1 drop into both eyes at bedtime.  Marland Kitchen losartan (COZAAR) 25 MG tablet TAKE 2 TABLETS BY MOUTH EVERY DAY  . timolol (TIMOPTIC) 0.5 % ophthalmic solution Place 1 drop into both eyes 2 (two) times daily.     ROS: Pertinent items noted in HPI and remainder of comprehensive ROS otherwise negative.  Labs/Other Tests and Data Reviewed:    Recent Labs: 11/18/2018: BUN 15; Creat 0.84; Potassium 4.7; Sodium 140 11/30/2018: Hemoglobin 15.4; Platelets 251 07/08/2019: ALT 26 09/02/2019: TSH 2.820   Recent Lipid Panel Lab Results  Component Value Date/Time   CHOL 192 07/08/2019 08:17 AM   TRIG 216 (H) 07/08/2019 08:17 AM   HDL 65 07/08/2019 08:17 AM   CHOLHDL 3.0 07/08/2019 08:17 AM   CHOLHDL 2.4 11/18/2018 08:04 AM   LDLCALC 91 07/08/2019 08:17 AM   LDLCALC 86 11/18/2018 08:04 AM    Wt Readings from Last 3 Encounters:  09/21/19 214 lb (97.1 kg)  09/02/19 217 lb (98.4 kg)  09/02/19 217 lb (98.4 kg)     Exam:    Vital Signs:  Wt 214 lb (97.1 kg)   BMI 30.71 kg/m    General appearance: alert and no distress Lungs: No visual respiratory difficulty Abdomen: Mildly overweight Extremities: extremities normal, atraumatic, no cyanosis or edema Skin: Skin color, texture, turgor normal. No rashes or lesions Neurologic: Mental status: Alert, oriented, thought content appropriate Psych: Pleasant  ASSESSMENT & PLAN:    1. Mixed dyslipidemia, goal LDL less than 70 2. CAD status post PCI 2010 (DES to the LAD) 3. History of MI in 2010 4. 80-pack-year smoking history-quit 12 years ago 5. 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 Hasting has a mixed dyslipidemia and possible ocular strokes or TIAs resulting in diplopia.  Imaging has been negative and he is followed by neurology.  He is not convinced that he is having recurrent strokes.  He is concerned about bruising on Plavix and is interested in going back to low-dose aspirin.  I  will defer this to Dr. Gwenlyn Found and/or his neurologist.  Previously he was prescribed ezetimibe to try to reach target LDL less than 70, however apparently this medicine would cost him $179 a month.  Based on that he would not take the medicine.  He says he has made significant dietary changes and continues to work on that and has lost 7 pounds.  We will plan for his request to repeat lipids in about 3 months.  At that time we could consider other optional therapies.  He may qualify for health well grant, although income may put him above the target, but this does cover ezetimibe.  Thanks again for the kind referral.  COVID-19 Education: The signs and symptoms of COVID-19  were discussed with the patient and how to seek care for testing (follow up with PCP or arrange E-visit).  The importance of social distancing was discussed today.  Patient Risk:   After full review of this patients clinical status, I feel that they are at least moderate risk at this time.  Time:   Today, I have spent 25 minutes with the patient with telehealth technology discussing dyslipidemia, coronary artery disease, antiplatelet therapy, target LDL, possible ocular stroke.     Medication Adjustments/Labs and Tests Ordered: Current medicines are reviewed at length with the patient today.  Concerns regarding medicines are outlined above.   Tests Ordered: No orders of the defined types were placed in this encounter.   Medication Changes: No orders of the defined types were placed in this encounter.   Disposition:  in 3 month(s)  Pixie Casino, MD, [facility], [place] Director of the Advanced Lipid Disorders &  Cardiovascular Risk Reduction Clinic Diplomate of the American Board of Clinical Lipidology Attending Cardiologist  Direct Dial: [phone]  Fax: [phone]  Website:  [email or web address]  Pixie Casino, MD  09/21/2019 8:53 AM

## 2019-09-21 NOTE — Patient Instructions (Addendum)
Medication Instructions:  Continue current medications and working on diet/exercise   *If you need a refill on your cardiac medications before your next appointment, please call your pharmacy*   Lab Work: FASTING lipid panel in 3 months  If you have labs (blood work) drawn today and your tests are completely normal, you will receive your results only by: Marland Kitchen MyChart Message (if you have MyChart) OR . A paper copy in the mail If you have any lab test that is abnormal or we need to change your treatment, we will call you to review the results.   Testing/Procedures: NONE   Follow-Up: At [facility], you and your health needs are our priority.  As part of our continuing mission to provide you with exceptional heart care, we have created designated Provider Care Teams.  These Care Teams include your primary Cardiologist (physician) and Advanced Practice Providers (APPs -  Physician Assistants and Nurse Practitioners) who all work together to provide you with the care you need, when you need it.  We recommend signing up for the patient portal called "MyChart".  Sign up information is provided on this After Visit Summary.  MyChart is used to connect with patients for Virtual Visits (Telemedicine).  Patients are able to view lab/test results, encounter notes, upcoming appointments, etc.  Non-urgent messages can be sent to your provider as well.   To learn more about what you can do with MyChart, go to [email or web address].    Your next appointment:   3 month(s) - lipid clinic  The format for your next appointment:   Either In Person or Virtual  Provider:   K. Mali Hilty, MD   Other Instructions

## 2019-09-23 DIAGNOSIS — H4922 Sixth [abducent] nerve palsy, left eye: Secondary | ICD-10-CM | POA: Diagnosis not present

## 2019-09-23 DIAGNOSIS — M9902 Segmental and somatic dysfunction of thoracic region: Secondary | ICD-10-CM | POA: Diagnosis not present

## 2019-09-23 DIAGNOSIS — M5033 Other cervical disc degeneration, cervicothoracic region: Secondary | ICD-10-CM | POA: Diagnosis not present

## 2019-09-23 DIAGNOSIS — G43001 Migraine without aura, not intractable, with status migrainosus: Secondary | ICD-10-CM | POA: Diagnosis not present

## 2019-09-23 DIAGNOSIS — M9901 Segmental and somatic dysfunction of cervical region: Secondary | ICD-10-CM | POA: Diagnosis not present

## 2019-09-24 DIAGNOSIS — H532 Diplopia: Secondary | ICD-10-CM | POA: Diagnosis not present

## 2019-09-28 DIAGNOSIS — M9902 Segmental and somatic dysfunction of thoracic region: Secondary | ICD-10-CM | POA: Diagnosis not present

## 2019-09-28 DIAGNOSIS — M5033 Other cervical disc degeneration, cervicothoracic region: Secondary | ICD-10-CM | POA: Diagnosis not present

## 2019-09-28 DIAGNOSIS — M9901 Segmental and somatic dysfunction of cervical region: Secondary | ICD-10-CM | POA: Diagnosis not present

## 2019-09-28 DIAGNOSIS — G43001 Migraine without aura, not intractable, with status migrainosus: Secondary | ICD-10-CM | POA: Diagnosis not present

## 2019-09-30 DIAGNOSIS — M9902 Segmental and somatic dysfunction of thoracic region: Secondary | ICD-10-CM | POA: Diagnosis not present

## 2019-09-30 DIAGNOSIS — M5033 Other cervical disc degeneration, cervicothoracic region: Secondary | ICD-10-CM | POA: Diagnosis not present

## 2019-09-30 DIAGNOSIS — G43001 Migraine without aura, not intractable, with status migrainosus: Secondary | ICD-10-CM | POA: Diagnosis not present

## 2019-09-30 DIAGNOSIS — M9901 Segmental and somatic dysfunction of cervical region: Secondary | ICD-10-CM | POA: Diagnosis not present

## 2019-09-30 NOTE — Telephone Encounter (Signed)
See recent [facility] Message.  Please cancel his follow up appointment with out office

## 2019-10-05 DIAGNOSIS — M9902 Segmental and somatic dysfunction of thoracic region: Secondary | ICD-10-CM | POA: Diagnosis not present

## 2019-10-05 DIAGNOSIS — M9901 Segmental and somatic dysfunction of cervical region: Secondary | ICD-10-CM | POA: Diagnosis not present

## 2019-10-05 DIAGNOSIS — G43001 Migraine without aura, not intractable, with status migrainosus: Secondary | ICD-10-CM | POA: Diagnosis not present

## 2019-10-05 DIAGNOSIS — M5033 Other cervical disc degeneration, cervicothoracic region: Secondary | ICD-10-CM | POA: Diagnosis not present

## 2019-10-07 DIAGNOSIS — M9902 Segmental and somatic dysfunction of thoracic region: Secondary | ICD-10-CM | POA: Diagnosis not present

## 2019-10-07 DIAGNOSIS — G43001 Migraine without aura, not intractable, with status migrainosus: Secondary | ICD-10-CM | POA: Diagnosis not present

## 2019-10-07 DIAGNOSIS — M9901 Segmental and somatic dysfunction of cervical region: Secondary | ICD-10-CM | POA: Diagnosis not present

## 2019-10-07 DIAGNOSIS — M5033 Other cervical disc degeneration, cervicothoracic region: Secondary | ICD-10-CM | POA: Diagnosis not present

## 2019-10-11 DIAGNOSIS — G43001 Migraine without aura, not intractable, with status migrainosus: Secondary | ICD-10-CM | POA: Diagnosis not present

## 2019-10-11 DIAGNOSIS — M9902 Segmental and somatic dysfunction of thoracic region: Secondary | ICD-10-CM | POA: Diagnosis not present

## 2019-10-11 DIAGNOSIS — M5033 Other cervical disc degeneration, cervicothoracic region: Secondary | ICD-10-CM | POA: Diagnosis not present

## 2019-10-11 DIAGNOSIS — M9901 Segmental and somatic dysfunction of cervical region: Secondary | ICD-10-CM | POA: Diagnosis not present

## 2019-10-13 ENCOUNTER — Other Ambulatory Visit: Payer: Self-pay | Admitting: Family Medicine

## 2019-10-13 DIAGNOSIS — G43001 Migraine without aura, not intractable, with status migrainosus: Secondary | ICD-10-CM | POA: Diagnosis not present

## 2019-10-13 DIAGNOSIS — M9901 Segmental and somatic dysfunction of cervical region: Secondary | ICD-10-CM | POA: Diagnosis not present

## 2019-10-13 DIAGNOSIS — M5033 Other cervical disc degeneration, cervicothoracic region: Secondary | ICD-10-CM | POA: Diagnosis not present

## 2019-10-13 DIAGNOSIS — M9902 Segmental and somatic dysfunction of thoracic region: Secondary | ICD-10-CM | POA: Diagnosis not present

## 2019-10-18 DIAGNOSIS — G43001 Migraine without aura, not intractable, with status migrainosus: Secondary | ICD-10-CM | POA: Diagnosis not present

## 2019-10-18 DIAGNOSIS — M5033 Other cervical disc degeneration, cervicothoracic region: Secondary | ICD-10-CM | POA: Diagnosis not present

## 2019-10-18 DIAGNOSIS — M9901 Segmental and somatic dysfunction of cervical region: Secondary | ICD-10-CM | POA: Diagnosis not present

## 2019-10-18 DIAGNOSIS — M9902 Segmental and somatic dysfunction of thoracic region: Secondary | ICD-10-CM | POA: Diagnosis not present

## 2019-10-20 DIAGNOSIS — M9902 Segmental and somatic dysfunction of thoracic region: Secondary | ICD-10-CM | POA: Diagnosis not present

## 2019-10-20 DIAGNOSIS — G43001 Migraine without aura, not intractable, with status migrainosus: Secondary | ICD-10-CM | POA: Diagnosis not present

## 2019-10-20 DIAGNOSIS — M5033 Other cervical disc degeneration, cervicothoracic region: Secondary | ICD-10-CM | POA: Diagnosis not present

## 2019-10-20 DIAGNOSIS — M9901 Segmental and somatic dysfunction of cervical region: Secondary | ICD-10-CM | POA: Diagnosis not present

## 2019-10-25 DIAGNOSIS — M5033 Other cervical disc degeneration, cervicothoracic region: Secondary | ICD-10-CM | POA: Diagnosis not present

## 2019-10-25 DIAGNOSIS — M9902 Segmental and somatic dysfunction of thoracic region: Secondary | ICD-10-CM | POA: Diagnosis not present

## 2019-10-25 DIAGNOSIS — M9901 Segmental and somatic dysfunction of cervical region: Secondary | ICD-10-CM | POA: Diagnosis not present

## 2019-10-25 DIAGNOSIS — G43001 Migraine without aura, not intractable, with status migrainosus: Secondary | ICD-10-CM | POA: Diagnosis not present

## 2019-11-01 DIAGNOSIS — M9902 Segmental and somatic dysfunction of thoracic region: Secondary | ICD-10-CM | POA: Diagnosis not present

## 2019-11-01 DIAGNOSIS — M5033 Other cervical disc degeneration, cervicothoracic region: Secondary | ICD-10-CM | POA: Diagnosis not present

## 2019-11-01 DIAGNOSIS — G43001 Migraine without aura, not intractable, with status migrainosus: Secondary | ICD-10-CM | POA: Diagnosis not present

## 2019-11-01 DIAGNOSIS — M9901 Segmental and somatic dysfunction of cervical region: Secondary | ICD-10-CM | POA: Diagnosis not present

## 2019-11-10 DIAGNOSIS — M9901 Segmental and somatic dysfunction of cervical region: Secondary | ICD-10-CM | POA: Diagnosis not present

## 2019-11-10 DIAGNOSIS — M9902 Segmental and somatic dysfunction of thoracic region: Secondary | ICD-10-CM | POA: Diagnosis not present

## 2019-11-10 DIAGNOSIS — M5033 Other cervical disc degeneration, cervicothoracic region: Secondary | ICD-10-CM | POA: Diagnosis not present

## 2019-11-10 DIAGNOSIS — G43001 Migraine without aura, not intractable, with status migrainosus: Secondary | ICD-10-CM | POA: Diagnosis not present

## 2019-11-11 DIAGNOSIS — H401121 Primary open-angle glaucoma, left eye, mild stage: Secondary | ICD-10-CM | POA: Diagnosis not present

## 2019-11-17 DIAGNOSIS — G43001 Migraine without aura, not intractable, with status migrainosus: Secondary | ICD-10-CM | POA: Diagnosis not present

## 2019-11-17 DIAGNOSIS — M9901 Segmental and somatic dysfunction of cervical region: Secondary | ICD-10-CM | POA: Diagnosis not present

## 2019-11-17 DIAGNOSIS — M5033 Other cervical disc degeneration, cervicothoracic region: Secondary | ICD-10-CM | POA: Diagnosis not present

## 2019-11-17 DIAGNOSIS — M9902 Segmental and somatic dysfunction of thoracic region: Secondary | ICD-10-CM | POA: Diagnosis not present

## 2019-11-18 ENCOUNTER — Other Ambulatory Visit: Payer: Self-pay

## 2019-11-18 ENCOUNTER — Ambulatory Visit (INDEPENDENT_AMBULATORY_CARE_PROVIDER_SITE_OTHER): Payer: Medicare HMO | Admitting: Family Medicine

## 2019-11-18 VITALS — BP 118/80 | HR 68 | Temp 96.1°F | Ht 70.0 in | Wt 217.0 lb

## 2019-11-18 DIAGNOSIS — L918 Other hypertrophic disorders of the skin: Secondary | ICD-10-CM | POA: Diagnosis not present

## 2019-11-18 NOTE — Progress Notes (Signed)
Patient ID: Todd Patterson, male    DOB: Mar 04, 1951, 69 y.o.   MRN: [id number]  PCP: [name], MD  Chief Complaint  Patient presents with  . Skin Tag    Underarm    Subjective:   Patient presents today complaining of bumps in his right axilla.  On examination, he has 7 pedunculated polyps.  The largest is roughly 4 mm in diameter.  The small cyst 3 mm in diameter.  They all taper to a fleshy stalk.  They have the classic appearance of skin tags.  He would like them removed  Patient Active Problem List   Diagnosis Date Noted  . Cerebrovascular accident (CVA) ([place]) 09/02/2019  . 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 Primary hyperparathyroidism ([place]) 07/04/2016  . Hyperlipidemia 04/26/2016  . Essential hypertension 04/26/2016  . CAD (coronary artery disease)      Prior to Admission medications   Medication Sig Start Date End Date Taking? Authorizing Provider  ALPRAZolam Duanne Moron) 1 MG tablet Take 1-2 tablets thirty minutes prior to MRI.  May take one additional tablet before entering scanner, if needed.  MUST HAVE DRIVER. 10/18/36  Yes [name], MD  atorvastatin (LIPITOR) 80 MG tablet TAKE 1 TABLET BY MOUTH EVERY DAY 02/16/18  Yes [name], MD  carvedilol (COREG) 6.25 MG tablet TAKE 1 TABLET BY MOUTH TWICE A DAY 09/15/17  Yes [name], MD  clopidogrel (PLAVIX) 75 MG tablet Take 1 tablet (75 mg total) by mouth daily. 09/22/17  Yes [name], MD  latanoprost (XALATAN) 0.005 % ophthalmic solution Place 1 drop into both eyes at bedtime.   Yes [provider]  losartan (COZAAR) 50 MG tablet Take 1 tablet (50 mg total) by mouth daily. 12/18/17  Yes [name], MD     Allergies  Allergen Reactions  . Lisinopril Cough     Family History  Problem Relation Age of Onset  . Dementia Mother   . Heart disease Father   . Cancer Sister   . Breast cancer Sister      Social History   Socioeconomic History  . Marital status: Married    Spouse name:  Not on file  . Number of children: Not on file  . Years of education: Not on file  . Highest education level: Not on file  Occupational History  . Not on file  Tobacco Use  . Smoking status: Former Smoker    Types: Cigarettes    Quit date: 03/19/2007    Years since quitting: 12.6  . Smokeless tobacco: Never Used  Vaping Use  . Vaping Use: Never used  Substance and Sexual Activity  . Alcohol use: Yes    Alcohol/week: 12.0 - 14.0 standard drinks    Types: 12 - 14 Cans of beer per week  . Drug use: No  . Sexual activity: Yes  Other Topics Concern  . Not on file  Social History Narrative  . Not on file   Social Determinants of Health   Financial Resource Strain:   . Difficulty of Paying Living Expenses: Not on file  Food Insecurity:   . Worried About Charity fundraiser in the Last Year: Not on file  . Ran Out of Food in the Last Year: Not on file  Transportation Needs:   . Lack of Transportation (Medical): Not on file  . Lack of Transportation (Non-Medical): Not on file  Physical Activity:   . Days of Exercise per Week: Not on  file  . Minutes of Exercise per Session: Not on file  Stress:   . Feeling of Stress : Not on file  Social Connections:   . Frequency of Communication with Friends and Family: Not on file  . Frequency of Social Gatherings with Friends and Family: Not on file  . Attends Religious Services: Not on file  . Active Member of Clubs or Organizations: Not on file  . Attends Archivist Meetings: Not on file  . Marital Status: Not on file  Intimate Partner Violence:   . Fear of Current or Ex-Partner: Not on file  . Emotionally Abused: Not on file  . Physically Abused: Not on file  . Sexually Abused: Not on file     Review of Systems  Constitutional: Negative.   HENT: Negative.   Eyes: Negative.   Respiratory: Negative.   Cardiovascular: Negative.   Gastrointestinal: Negative.   Endocrine: Negative.   Genitourinary: Negative.     Musculoskeletal: Negative.   Skin: Negative.   Allergic/Immunologic: Negative.   Neurological: Negative.   Hematological: Negative.   Psychiatric/Behavioral: Negative.   All other systems reviewed and are negative.      Objective:    Vitals:   11/18/19 0850  BP: 118/80  Pulse: 68  Temp: (!) 96.1 F (35.6 C)  TempSrc: Temporal  SpO2: 95%  Weight: 217 lb (98.4 kg)  Height: 5\' 10"  (1.778 m)      Physical Exam Vitals and nursing note reviewed.  Constitutional:      General: He is not in acute distress.    Appearance: Normal appearance. He is well-developed. He is not toxic-appearing or diaphoretic.  HENT:     Head: Normocephalic and atraumatic.     Jaw: No trismus.     Nose: Mucosal edema present.     Right Sinus: No maxillary sinus tenderness or frontal sinus tenderness.     Left Sinus: No maxillary sinus tenderness or frontal sinus tenderness.     Mouth/Throat:     Mouth: Mucous membranes are not pale, not dry and not cyanotic.     Pharynx: Uvula midline. No uvula swelling.     Tonsils: No tonsillar exudate or tonsillar abscesses.  Eyes:     General: Lids are normal.  Neck:     Trachea: Trachea and phonation normal. No tracheal deviation.  Cardiovascular:     Rate and Rhythm: Normal rate and regular rhythm.     Pulses:          Radial pulses are 2+ on the right side and 2+ on the left side.     Heart sounds: Normal heart sounds. No murmur heard.  No friction rub. No gallop.      Comments: No lower extremity edema Pulmonary:     Effort: Pulmonary effort is normal. No tachypnea, accessory muscle usage or respiratory distress.     Breath sounds: Normal breath sounds. No stridor. No decreased breath sounds, wheezing, rhonchi or rales.  Musculoskeletal:        General: Normal range of motion.     Cervical back: Normal range of motion and neck supple.  Lymphadenopathy:     Cervical: No cervical adenopathy.  Skin:    General: Skin is warm and dry.     Findings:  Lesion present.     Nails: There is no clubbing.  Neurological:     Mental Status: He is alert.     Motor: No abnormal muscle tone.  Psychiatric:  Speech: Speech normal.        Behavior: Behavior is cooperative.     Patient has 7 well pedunculated fleshy skin tags in the right axilla      Assessment & Plan:  Skin tags, multiple acquired    I gave the patient the option of removing them with a scalpel after anesthetizing them with lidocaine or treating them with liquid nitrogen cryotherapy.  The patient elects to try cryotherapy.  Each skin tag was then treated for approximately 20 seconds with liquid nitrogen cryotherapy.  If the lesions persist we will have to manually remove them. [name], MD 11/18/19 9:07 AM

## 2019-11-24 DIAGNOSIS — M9902 Segmental and somatic dysfunction of thoracic region: Secondary | ICD-10-CM | POA: Diagnosis not present

## 2019-11-24 DIAGNOSIS — G43001 Migraine without aura, not intractable, with status migrainosus: Secondary | ICD-10-CM | POA: Diagnosis not present

## 2019-11-24 DIAGNOSIS — M9901 Segmental and somatic dysfunction of cervical region: Secondary | ICD-10-CM | POA: Diagnosis not present

## 2019-11-24 DIAGNOSIS — M5033 Other cervical disc degeneration, cervicothoracic region: Secondary | ICD-10-CM | POA: Diagnosis not present

## 2019-12-04 ENCOUNTER — Other Ambulatory Visit: Payer: Self-pay | Admitting: Family Medicine

## 2019-12-06 ENCOUNTER — Ambulatory Visit: Payer: Medicare HMO | Admitting: Neurology

## 2019-12-08 DIAGNOSIS — M5033 Other cervical disc degeneration, cervicothoracic region: Secondary | ICD-10-CM | POA: Diagnosis not present

## 2019-12-08 DIAGNOSIS — G43001 Migraine without aura, not intractable, with status migrainosus: Secondary | ICD-10-CM | POA: Diagnosis not present

## 2019-12-08 DIAGNOSIS — M9901 Segmental and somatic dysfunction of cervical region: Secondary | ICD-10-CM | POA: Diagnosis not present

## 2019-12-08 DIAGNOSIS — M9902 Segmental and somatic dysfunction of thoracic region: Secondary | ICD-10-CM | POA: Diagnosis not present

## 2019-12-10 ENCOUNTER — Ambulatory Visit: Payer: Medicare HMO | Admitting: Family Medicine

## 2019-12-16 ENCOUNTER — Ambulatory Visit (INDEPENDENT_AMBULATORY_CARE_PROVIDER_SITE_OTHER): Payer: Medicare HMO | Admitting: Family Medicine

## 2019-12-16 ENCOUNTER — Other Ambulatory Visit: Payer: Self-pay

## 2019-12-16 VITALS — BP 140/90 | HR 64 | Temp 97.7°F | Ht 70.0 in | Wt 218.0 lb

## 2019-12-16 DIAGNOSIS — Z23 Encounter for immunization: Secondary | ICD-10-CM

## 2019-12-16 DIAGNOSIS — I2510 Atherosclerotic heart disease of native coronary artery without angina pectoris: Secondary | ICD-10-CM | POA: Diagnosis not present

## 2019-12-16 DIAGNOSIS — Z125 Encounter for screening for malignant neoplasm of prostate: Secondary | ICD-10-CM

## 2019-12-16 DIAGNOSIS — I10 Essential (primary) hypertension: Secondary | ICD-10-CM

## 2019-12-16 DIAGNOSIS — Z8673 Personal history of transient ischemic attack (TIA), and cerebral infarction without residual deficits: Secondary | ICD-10-CM

## 2019-12-16 NOTE — Progress Notes (Signed)
Subjective:    Patient ID: Todd Patterson, male    DOB: [birth year], 69 y.o.   MRN: [id number]  Headache     09/2017 Patient is a very pleasant 69 year old white male whose past medical history is significant for myocardial infarction in 2008. Patient underwent PTCA with stenting.  Patient states that he had a bare-metal stent. He was continued on aspirin and Plavix for 2 years and then was instructed to continue aspirin thereafter. He also has a history of hyperlipidemia and hypertension.  2 weeks ago, the patient developed double vision.  If he closes his right eye, the double vision will go away.  If he opens the right eye, the double vision is present.  It is horizontal double vision.  He states that it is constant as long as his right eye is open.  It does not wax and wane throughout the day.  He denies any trouble swallowing.  He denies any muscle weakness.  He denies any pain in his.  He does have some mild headache.  This is diffuse in nature.  It is located mainly in the back of his neck and in his sinus area.  He denies any pain or tenderness over his temporal artery.  He denies any jaw claudication.  He denies any pain in his shoulders or in his hips.  He has no past medical history for migraines.  He denies any symptoms of multiple sclerosis.  On physical exam, the patient is unable to abduct his right eye on lateral gaze to the right.  He is able to abduct his right eye on lateral gaze to the left.  He has normal upward gaze.  He has normal downward gaze.  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  Differential diagnosis includes idiopathic 6 cranial nerve palsy, internuclear ophthalmoplegia, ischemic insult to the 6 cranial nerve or his nucleus, myasthenia gravis although unlikely based on his exam, temporal arteritis although unlikely  based on his exam, Lyme disease with an isolated 6 cranial nerve palsy.  I will begin the work-up by obtaining an MRI of the brain to rule out ischemic insult to the 6 cranial nerve for his nucleus.  I will consult neurology for second opinion.  I suspect that the patient is either suffered an ischemic insult to the 6 cranial nerve or that this is idiopathic 6 cranial nerve palsy that may resolve spontaneously on its own.  I will screen for myasthenia gravis by checking acetylcholine receptor blocking antibodies, I will check for giant cell arteritis with a sedimentation rate/CRP, I will check a TSH to rule out thyroid abnormalities and also check Lyme serologies.   09/02/19 Work-up at that time was unremarkable.  Ultimately patient saw Dr. Krista Blue and cause was felt to be microvascular in nature.  Symptoms have now returned.  However now symptoms are present on the left side.  He states for the last 3 days he has had diplopia.  It is affecting his left eye.  He is unable to laterally abduct his left eye.  If he looks to the left he develops double vision.  If he looks to the right his vision is normal.  Patient has a visible palsy with lateral gaze in the left eye.  Palsy is isolated to the 6th cranial nerve.  Patient states that for the last  3 days he is also had a pressure-like headache at the base of his neck radiating up the left side of his face and behind his left eye.  This is a recurrent isolated 6 cranial nerve palsy however it is now on the contralateral side.  I believe that this is very unusual and I am not convinced that this is vascular in nature.  I suspect increased intracranial pressure given his headache.  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  2 years ago he had an isolated right cranial nerve palsy in the 6 the nerve.  The recurrent nature on the contralateral side makes me suspicious about possible increased intracranial pressure particular given his  headaches.  This would make me suspicious about possible pseudotumor cerebri or possibly an obstruction in the outflow of cerebrospinal fluid due to a Chiari Arnold malformation.  Therefore I would like to get a second opinion with his neurologist.  I have placed a call to her office in an effort to try to get him worked in for an evaluation.  Dr Krista Blue graciously agreed to see him this afternoon at [facility]  and I certainly appreciate it.   12/16/19 Neurology felt that the recurrent 6 cranial nerve palsy was also likely an ischemic insult.  They recommended continuing on Plavix in place of aspirin.  Repeat MRI and CTA of the head and neck revealed no abnormalities or significant lesions.  The patient's palsy spontaneously resolves usually in 2 weeks.  Whenever he has the palsy he usually has neck pain associated with arthritis.  The neck pain typically goes away in 1 to 2 weeks and shortly thereafter the palsy goes away.  He believes that he may have some type of nerve impingement in his spine.  I do not feel comfortable that that is the cause of the 6 cranial nerve palsy given the location of the 6 cranial nerve nucleus in the brainstem however also believe it is very coincidental that he has had contralateral "ischemic insults" in the exact same spot.  However I see no harm in treating the patient conservatively for potential strokes therefore I recommended continuing the Plavix as well.  His blood pressure today is borderline.  It was excellent last time.  Of asked him to keep an eye on this at home.  He denies any chest pain shortness of breath or dyspnea on exertion.  He is due for fasting lab work.  He is also long overdue for prostate cancer screening. Past Medical History:  Diagnosis Date  . Colon polyps    colonoscopy 2018, recommended repeat in 1 year due to numbe rof polyps.   . Hyperlipidemia   . Hypertension   . Hyperthyroidism    hyperparthyroid  . Myocardial infarction [facility]) 2009   Has 1 stent    Past Surgical History:  Procedure Laterality Date  . APPENDECTOMY    . CORONARY ANGIOPLASTY WITH STENT PLACEMENT  2009   1 stent  . PARATHYROIDECTOMY N/A 07/04/2016   Procedure: PARATHYROIDECTOMY;  Surgeon: Jackolyn Confer, MD;  Location: [facility];  Service: General;  Laterality: N/A;  . TONSILLECTOMY     Current Outpatient Medications on File Prior to Visit  Medication Sig Dispense Refill  . atorvastatin (LIPITOR) 80 MG tablet TAKE 1 TABLET BY MOUTH EVERY DAY 90 tablet 1  . carvedilol (COREG) 6.25 MG tablet TAKE 1 TABLET BY MOUTH TWICE A DAY 180 tablet 1  . clopidogrel (PLAVIX) 75 MG  tablet Take 1 tablet (75 mg total) by mouth daily. Please request future refills from PCP. 90 tablet 1  . latanoprost (XALATAN) 0.005 % ophthalmic solution Place 1 drop into both eyes at bedtime.    Marland Kitchen losartan (COZAAR) 25 MG tablet TAKE 2 TABLETS BY MOUTH EVERY DAY 180 tablet 3  . timolol (TIMOPTIC) 0.5 % ophthalmic solution Place 1 drop into both eyes 2 (two) times daily.     No current facility-administered medications on file prior to visit.   Allergies  Allergen Reactions  . Lisinopril Cough   Social History   Socioeconomic History  . Marital status: Married    Spouse name: Not on file  . Number of children: Not on file  . Years of education: Not on file  . Highest education level: Not on file  Occupational History  . Not on file  Tobacco Use  . Smoking status: Former Smoker    Types: Cigarettes    Quit date: 03/19/2007    Years since quitting: 12.7  . Smokeless tobacco: Never Used  Vaping Use  . Vaping Use: Never used  Substance and Sexual Activity  . Alcohol use: Yes    Alcohol/week: 12.0 - 14.0 standard drinks    Types: 12 - 14 Cans of beer per week  . Drug use: No  . Sexual activity: Yes  Other Topics Concern  . Not on file  Social History Narrative  . Not on file   Social Determinants of Health   Financial Resource Strain:   . Difficulty of Paying Living Expenses: Not on  file  Food Insecurity:   . Worried About Charity fundraiser in the Last Year: Not on file  . Ran Out of Food in the Last Year: Not on file  Transportation Needs:   . Lack of Transportation (Medical): Not on file  . Lack of Transportation (Non-Medical): Not on file  Physical Activity:   . Days of Exercise per Week: Not on file  . Minutes of Exercise per Session: Not on file  Stress:   . Feeling of Stress : Not on file  Social Connections:   . Frequency of Communication with Friends and Family: Not on file  . Frequency of Social Gatherings with Friends and Family: Not on file  . Attends Religious Services: Not on file  . Active Member of Clubs or Organizations: Not on file  . Attends Archivist Meetings: Not on file  . Marital Status: Not on file  Intimate Partner Violence:   . Fear of Current or Ex-Partner: Not on file  . Emotionally Abused: Not on file  . Physically Abused: Not on file  . Sexually Abused: Not on file   Family History  Problem Relation Age of Onset  . Dementia Mother   . Heart disease Father   . Cancer Sister   . Breast cancer Sister       Review of Systems  Neurological: Positive for headaches.  All other systems reviewed and are negative.      Objective:   Physical Exam Vitals reviewed.  Constitutional:      General: He is not in acute distress.    Appearance: He is well-developed. He is not diaphoretic.  HENT:     Head: Normocephalic and atraumatic.     Right Ear: External ear normal.     Left Ear: External ear normal.     Nose: Nose normal.     Mouth/Throat:     Pharynx: No oropharyngeal  exudate.  Eyes:     General: No scleral icterus.       Right eye: No discharge.        Left eye: No discharge.     Extraocular Movements:     Right eye: Abnormal extraocular motion present. No nystagmus.     Left eye: Normal extraocular motion and no nystagmus.     Conjunctiva/sclera: Conjunctivae normal.     Pupils: Pupils are equal, round,  and reactive to light.  Neck:     Thyroid: No thyromegaly.     Vascular: No JVD.     Trachea: No tracheal deviation.  Cardiovascular:     Rate and Rhythm: Normal rate and regular rhythm.     Heart sounds: Normal heart sounds. No murmur heard.  No friction rub. No gallop.   Pulmonary:     Effort: Pulmonary effort is normal. No respiratory distress.     Breath sounds: Normal breath sounds. No stridor. No wheezing or rales.  Chest:     Chest wall: No tenderness.  Abdominal:     General: Bowel sounds are normal. There is no distension.     Palpations: Abdomen is soft. There is no mass.     Tenderness: There is no abdominal tenderness. There is no guarding or rebound.  Musculoskeletal:        General: No tenderness or deformity. Normal range of motion.     Cervical back: Normal range of motion and neck supple.  Lymphadenopathy:     Cervical: No cervical adenopathy.  Skin:    General: Skin is warm.     Coloration: Skin is not pale.     Findings: No erythema or rash.  Neurological:     Mental Status: He is alert and oriented to person, place, and time.     Cranial Nerves: No cranial nerve deficit.     Sensory: No sensory deficit.     Motor: No tremor, atrophy or abnormal muscle tone.     Coordination: Coordination normal.     Gait: Gait normal.     Deep Tendon Reflexes: Reflexes are normal and symmetric.  Psychiatric:        Behavior: Behavior normal.        Thought Content: Thought content normal.        Judgment: Judgment normal.           Assessment & Plan:  Essential hypertension - Plan: CBC with Differential/Platelet, COMPLETE METABOLIC PANEL WITH GFR, Lipid panel  Coronary artery disease involving native coronary artery of native heart without angina pectoris - Plan: CBC with Differential/Platelet, COMPLETE METABOLIC PANEL WITH GFR, Lipid panel  Prostate cancer screening - Plan: PSA  History of CVA (cerebrovascular accident)  I will screen the patient for  prostate cancer with a PSA.  He received his flu shot today.  I recommended a booster on his Covid vaccination.  Check lab work today.  Goal LDL cholesterol is less than 70.  Monitor CMP for any liver or renal function abnormalities.  Check CBC for any anemia.  Recommended the patient check his blood pressure daily and if consistently greater than 140/90 we need to do a better job of reducing his cardiovascular risk factors by better controlling his blood pressure.  He will call me with his blood pressure values in 2 weeks.

## 2019-12-17 ENCOUNTER — Other Ambulatory Visit: Payer: Self-pay | Admitting: Family Medicine

## 2019-12-17 DIAGNOSIS — R972 Elevated prostate specific antigen [PSA]: Secondary | ICD-10-CM

## 2019-12-17 LAB — COMPLETE METABOLIC PANEL WITH GFR
AG Ratio: 1.8 (calc) (ref 1.0–2.5)
ALT: 23 U/L (ref 9–46)
AST: 16 U/L (ref 10–35)
Albumin: 4.2 g/dL (ref 3.6–5.1)
Alkaline phosphatase (APISO): 56 U/L (ref 35–144)
BUN: 14 mg/dL (ref 7–25)
CO2: 27 mmol/L (ref 20–32)
Calcium: 9.8 mg/dL (ref 8.6–10.3)
Chloride: 104 mmol/L (ref 98–110)
Creat: 0.74 mg/dL (ref 0.70–1.25)
GFR, Est African American: 109 mL/min/{1.73_m2} (ref >=60)
GFR, Est Non African American: 94 mL/min/{1.73_m2} (ref >=60)
Globulin: 2.4 g/dL (calc) (ref 1.9–3.7)
Glucose, Bld: 91 mg/dL (ref 65–99)
Potassium: 4.7 mmol/L (ref 3.5–5.3)
Sodium: 141 mmol/L (ref 135–146)
Total Bilirubin: 0.5 mg/dL (ref 0.2–1.2)
Total Protein: 6.6 g/dL (ref 6.1–8.1)

## 2019-12-17 LAB — CBC WITH DIFFERENTIAL/PLATELET
Absolute Monocytes: 1345 cells/uL — ABNORMAL HIGH (ref 200–950)
Basophils Absolute: 79 cells/uL (ref 0–200)
Basophils Relative: 0.7 %
Eosinophils Absolute: 463 cells/uL (ref 15–500)
Eosinophils Relative: 4.1 %
HCT: 45.2 % (ref 38.5–50.0)
Hemoglobin: 15.4 g/dL (ref 13.2–17.1)
Lymphs Abs: 2735 cells/uL (ref 850–3900)
MCH: 31 pg (ref 27.0–33.0)
MCHC: 34.1 g/dL (ref 32.0–36.0)
MCV: 91.1 fL (ref 80.0–100.0)
MPV: 10.9 fL (ref 7.5–12.5)
Monocytes Relative: 11.9 %
Neutro Abs: 6678 cells/uL (ref 1500–7800)
Neutrophils Relative %: 59.1 %
Platelets: 276 10*3/uL (ref 140–400)
RBC: 4.96 10*6/uL (ref 4.20–5.80)
RDW: 12.6 % (ref 11.0–15.0)
Total Lymphocyte: 24.2 %
WBC: 11.3 10*3/uL — ABNORMAL HIGH (ref 3.8–10.8)

## 2019-12-17 LAB — LIPID PANEL
Cholesterol: 174 mg/dL (ref <200)
HDL: 67 mg/dL (ref >=40)
LDL Cholesterol (Calc): 84 mg/dL (calc)
Non-HDL Cholesterol (Calc): 107 mg/dL (calc) (ref <130)
Total CHOL/HDL Ratio: 2.6 (calc) (ref <5.0)
Triglycerides: 134 mg/dL (ref <150)

## 2019-12-17 LAB — PSA: PSA: 5.98 ng/mL — ABNORMAL HIGH (ref <=4.0)

## 2019-12-22 DIAGNOSIS — M9902 Segmental and somatic dysfunction of thoracic region: Secondary | ICD-10-CM | POA: Diagnosis not present

## 2019-12-22 DIAGNOSIS — G43001 Migraine without aura, not intractable, with status migrainosus: Secondary | ICD-10-CM | POA: Diagnosis not present

## 2019-12-22 DIAGNOSIS — M5033 Other cervical disc degeneration, cervicothoracic region: Secondary | ICD-10-CM | POA: Diagnosis not present

## 2019-12-22 DIAGNOSIS — M9901 Segmental and somatic dysfunction of cervical region: Secondary | ICD-10-CM | POA: Diagnosis not present

## 2020-01-05 DIAGNOSIS — M5033 Other cervical disc degeneration, cervicothoracic region: Secondary | ICD-10-CM | POA: Diagnosis not present

## 2020-01-05 DIAGNOSIS — M9902 Segmental and somatic dysfunction of thoracic region: Secondary | ICD-10-CM | POA: Diagnosis not present

## 2020-01-05 DIAGNOSIS — G43001 Migraine without aura, not intractable, with status migrainosus: Secondary | ICD-10-CM | POA: Diagnosis not present

## 2020-01-05 DIAGNOSIS — M9901 Segmental and somatic dysfunction of cervical region: Secondary | ICD-10-CM | POA: Diagnosis not present

## 2020-01-18 DIAGNOSIS — R69 Illness, unspecified: Secondary | ICD-10-CM | POA: Diagnosis not present

## 2020-01-26 DIAGNOSIS — M9901 Segmental and somatic dysfunction of cervical region: Secondary | ICD-10-CM | POA: Diagnosis not present

## 2020-01-26 DIAGNOSIS — G43001 Migraine without aura, not intractable, with status migrainosus: Secondary | ICD-10-CM | POA: Diagnosis not present

## 2020-01-26 DIAGNOSIS — M9902 Segmental and somatic dysfunction of thoracic region: Secondary | ICD-10-CM | POA: Diagnosis not present

## 2020-01-26 DIAGNOSIS — M5033 Other cervical disc degeneration, cervicothoracic region: Secondary | ICD-10-CM | POA: Diagnosis not present

## 2020-02-04 ENCOUNTER — Other Ambulatory Visit: Payer: Self-pay | Admitting: Neurology

## 2020-02-04 ENCOUNTER — Other Ambulatory Visit: Payer: Self-pay | Admitting: Family Medicine

## 2020-02-04 DIAGNOSIS — N401 Enlarged prostate with lower urinary tract symptoms: Secondary | ICD-10-CM | POA: Diagnosis not present

## 2020-02-04 DIAGNOSIS — R351 Nocturia: Secondary | ICD-10-CM | POA: Diagnosis not present

## 2020-02-04 DIAGNOSIS — R972 Elevated prostate specific antigen [PSA]: Secondary | ICD-10-CM | POA: Diagnosis not present

## 2020-02-04 DIAGNOSIS — R350 Frequency of micturition: Secondary | ICD-10-CM | POA: Diagnosis not present

## 2020-02-07 ENCOUNTER — Other Ambulatory Visit: Payer: Self-pay

## 2020-02-07 MED ORDER — ATORVASTATIN CALCIUM 80 MG PO TABS: 80.0000 mg | ORAL_TABLET | Freq: Every day | ORAL | 1 refills | Status: DC

## 2020-02-16 DIAGNOSIS — M9902 Segmental and somatic dysfunction of thoracic region: Secondary | ICD-10-CM | POA: Diagnosis not present

## 2020-02-16 DIAGNOSIS — M9901 Segmental and somatic dysfunction of cervical region: Secondary | ICD-10-CM | POA: Diagnosis not present

## 2020-02-16 DIAGNOSIS — G43001 Migraine without aura, not intractable, with status migrainosus: Secondary | ICD-10-CM | POA: Diagnosis not present

## 2020-02-16 DIAGNOSIS — M5033 Other cervical disc degeneration, cervicothoracic region: Secondary | ICD-10-CM | POA: Diagnosis not present

## 2020-02-18 ENCOUNTER — Telehealth: Payer: Self-pay

## 2020-02-18 NOTE — Telephone Encounter (Signed)
° ° °  [place] with [facility] called back she said the anesthesia type is Local 2% lidocane and no epi

## 2020-02-18 NOTE — Telephone Encounter (Signed)
° °  [facility] Pre-operative Risk Assessment    Request for surgical clearance:  1. What type of surgery is being performed? PROSTATE BIOPSY   2. When is this surgery scheduled? TBD   3. What type of clearance is required (medical clearance vs. Pharmacy clearance to hold med vs. Both)? BOTH  4. Are there any medications that need to be held prior to surgery and how long? ASA 5DAYS AND PLAVIX   5. Practice name and name of physician performing surgery? [facility]  DR Titus Dubin  ATTN:BRITTANY   6. What is the office phone number? [phone]   7.   What is the office fax number? [phone]  8.   Anesthesia type (None, local, MAC, general) ? SOME SORT OF LOCAL-WILL CALL BACK WITH TYPE

## 2020-02-18 NOTE — Telephone Encounter (Signed)
Patient called as part of preoperative protocol on 02/18/2020 at 1327.  Patient needs call back

## 2020-02-23 NOTE — Telephone Encounter (Signed)
Hi Dr. Gwenlyn Found,   Mr. Todd Patterson is scheduled for a prostate biopsy and is being asked to hold Aspirin/Plavix. He has a history of CAD with MI in 2010 s/p DES to LAD in [place]. More recently he had a right occular stroke and Neurology changed his Aspirin to Plavix. He was last seen by oy in 05/2019 at which time he was doing well from a cardiac standpoint. Would you be OK with him holding Plavix for 5 days from a cardiac standpoint? I will ask Urology to reach out to Neurology for there recommendations as well.  Please route response back to P CV DIV PREOP.  Thank you! Eshika Reckart

## 2020-02-23 NOTE — Telephone Encounter (Signed)
Okay to hold antiplatelet agents.

## 2020-02-24 NOTE — Telephone Encounter (Signed)
   Primary Cardiologist: Dr. Gwenlyn Found  Chart reviewed as part of pre-operative protocol coverage. Patient was last seen by Dr. Gwenlyn Found in 05/2019 at which time he was doing well from a cardiac standpoint. Patient was contacted today for further pre-op evaluation at which time he reported doing well since his last visit. He denied any chest pain, shortness of breath, orthopnea, PND, palpitations, lightheadedness, dizziness, syncope. He is able to complete >4.0 METS without any problems. Given past medical history and time since last visit, based on ACC/AHA guidelines, Todd Patterson would be at acceptable risk for the planned procedure without further cardiovascular testing.   Per Dr. Gwenlyn Found, [place] to hold Plavix for 5 days prior to procedure from a cardiac standpoint. This should be restarted as soon as possible following procedure. Patient is no longer on Aspirin per our records. Of note, patient also had a possible occular stroke. Therefore, would also recommend checking with PCP for any additional recommendations regarding Plavix.  I will route this recommendation to the requesting party via Epic fax function and remove from pre-op pool.  Please call with questions.  Todd Mclean, PA-C 02/24/2020, 9:13 AM

## 2020-03-02 ENCOUNTER — Other Ambulatory Visit: Payer: Self-pay | Admitting: Family Medicine

## 2020-03-02 ENCOUNTER — Other Ambulatory Visit: Payer: Self-pay | Admitting: Neurology

## 2020-03-08 DIAGNOSIS — M9901 Segmental and somatic dysfunction of cervical region: Secondary | ICD-10-CM | POA: Diagnosis not present

## 2020-03-08 DIAGNOSIS — G43001 Migraine without aura, not intractable, with status migrainosus: Secondary | ICD-10-CM | POA: Diagnosis not present

## 2020-03-08 DIAGNOSIS — M5033 Other cervical disc degeneration, cervicothoracic region: Secondary | ICD-10-CM | POA: Diagnosis not present

## 2020-03-08 DIAGNOSIS — M9902 Segmental and somatic dysfunction of thoracic region: Secondary | ICD-10-CM | POA: Diagnosis not present

## 2020-03-21 DIAGNOSIS — Z8616 Personal history of COVID-19: Secondary | ICD-10-CM

## 2020-03-21 HISTORY — DX: Personal history of COVID-19: Z86.16

## 2020-03-31 ENCOUNTER — Telehealth: Payer: Self-pay

## 2020-03-31 ENCOUNTER — Other Ambulatory Visit: Payer: Self-pay

## 2020-03-31 DIAGNOSIS — U071 COVID-19: Secondary | ICD-10-CM

## 2020-03-31 NOTE — Telephone Encounter (Signed)
Pt called informing that he has covid with all known symptoms. He is requesting monoclonial infusion. Order has ben placed

## 2020-04-01 ENCOUNTER — Telehealth: Payer: Self-pay | Admitting: Unknown Physician Specialty

## 2020-04-01 ENCOUNTER — Ambulatory Visit (HOSPITAL_COMMUNITY)
Admission: RE | Admit: 2020-04-01 | Discharge: 2020-04-01 | Disposition: A | Discharge status: Home / Self Care | Payer: Medicare HMO | Source: Ambulatory Visit | Attending: Pulmonary Disease | Admitting: Pulmonary Disease

## 2020-04-01 ENCOUNTER — Encounter: Payer: Self-pay | Admitting: Unknown Physician Specialty

## 2020-04-01 ENCOUNTER — Other Ambulatory Visit: Payer: Self-pay | Admitting: Unknown Physician Specialty

## 2020-04-01 DIAGNOSIS — I639 Cerebral infarction, unspecified: Secondary | ICD-10-CM

## 2020-04-01 DIAGNOSIS — I2510 Atherosclerotic heart disease of native coronary artery without angina pectoris: Secondary | ICD-10-CM | POA: Diagnosis not present

## 2020-04-01 DIAGNOSIS — U071 COVID-19: Secondary | ICD-10-CM | POA: Diagnosis not present

## 2020-04-01 DIAGNOSIS — E210 Primary hyperparathyroidism: Secondary | ICD-10-CM

## 2020-04-01 DIAGNOSIS — I10 Essential (primary) hypertension: Secondary | ICD-10-CM

## 2020-04-01 MED ORDER — ALBUTEROL SULFATE HFA 108 (90 BASE) MCG/ACT IN AERS: 2.0000 | INHALATION_SPRAY | Freq: Once | RESPIRATORY_TRACT | Status: DC | PRN

## 2020-04-01 MED ORDER — EPINEPHRINE 0.3 MG/0.3ML IJ SOAJ: 0.3000 mg | Freq: Once | INTRAMUSCULAR | Status: DC | PRN

## 2020-04-01 MED ORDER — DIPHENHYDRAMINE HCL 50 MG/ML IJ SOLN: 50.0000 mg | Freq: Once | INTRAMUSCULAR | Status: DC | PRN

## 2020-04-01 MED ORDER — SODIUM CHLORIDE 0.9 % IV SOLN: INTRAVENOUS | Status: DC | PRN

## 2020-04-01 MED ORDER — FAMOTIDINE IN NACL 20-0.9 MG/50ML-% IV SOLN: 20.0000 mg | Freq: Once | INTRAVENOUS | Status: DC | PRN

## 2020-04-01 MED ORDER — SOTROVIMAB 500 MG/8ML IV SOLN
500.0000 mg | Freq: Once | INTRAVENOUS | Status: AC
  Administered 2020-04-01: 500 mg via INTRAVENOUS

## 2020-04-01 MED ORDER — METHYLPREDNISOLONE SODIUM SUCC 125 MG IJ SOLR: 125.0000 mg | Freq: Once | INTRAMUSCULAR | Status: DC | PRN

## 2020-04-01 NOTE — Discharge Instructions (Signed)

## 2020-04-01 NOTE — Progress Notes (Signed)
Patient reviewed Fact Sheet for Patients, Parents, and Caregivers for Emergency Use Authorization (EUA) of sotrovimab for the Treatment of Coronavirus. Patient also reviewed and is agreeable to the estimated cost of treatment. Patient is agreeable to proceed.   

## 2020-04-01 NOTE — Progress Notes (Signed)
Diagnosis: COVID-19  Physician: Dr. Patrick Wright  Procedure: [facility] Infusion [facility] Med: Sotrovimab infusion - Provided patient with sotrovimab fact sheet for patients, parents, and caregivers prior to infusion.   Complications: No immediate complications noted  Discharge: Discharged home    

## 2020-04-01 NOTE — Telephone Encounter (Signed)
I connected by phone with Todd Patterson on 04/01/2020 at 12:52 PM to discuss the potential use of a new treatment for mild to moderate COVID-19 viral infection in non-hospitalized patients.  This patient is a 70 y.o. male that meets the FDA criteria for Emergency Use Authorization of COVID monoclonal antibody sotrovimab.  Has a (+) direct SARS-CoV-2 viral test result  Has mild or moderate COVID-19   Is NOT hospitalized due to COVID-19  Is within 10 days of symptom onset  Has at least one of the high risk factor(s) for progression to severe COVID-19 and/or hospitalization as defined in EUA.  Specific high risk criteria : Older age (>/= 70 yo), BMI > 25 and Cardiovascular disease or hypertension   I have spoken and communicated the following to the patient or parent/caregiver regarding COVID monoclonal antibody treatment:  1. FDA has authorized the emergency use for the treatment of mild to moderate COVID-19 in adults and pediatric patients with positive results of direct SARS-CoV-2 viral testing who are 63 years of age and older weighing at least 40 kg, and who are at high risk for progressing to severe COVID-19 and/or hospitalization.  2. The significant known and potential risks and benefits of COVID monoclonal antibody, and the extent to which such potential risks and benefits are unknown.  3. Information on available alternative treatments and the risks and benefits of those alternatives, including clinical trials.  4. Patients treated with COVID monoclonal antibody should continue to self-isolate and use infection control measures (e.g., wear mask, isolate, social distance, avoid sharing personal items, clean and disinfect "high touch" surfaces, and frequent handwashing) according to CDC guidelines.   5. The patient or parent/caregiver has the option to accept or refuse COVID monoclonal antibody treatment.  After reviewing this information with the patient, the patient has agreed to  receive one of the available covid 19 monoclonal antibodies and will be provided an appropriate fact sheet prior to infusion. Kathrine Haddock, NP 04/01/2020 12:52 PM Sx onset 1/11

## 2020-04-11 DIAGNOSIS — M9902 Segmental and somatic dysfunction of thoracic region: Secondary | ICD-10-CM | POA: Diagnosis not present

## 2020-04-11 DIAGNOSIS — M5033 Other cervical disc degeneration, cervicothoracic region: Secondary | ICD-10-CM | POA: Diagnosis not present

## 2020-04-11 DIAGNOSIS — M9901 Segmental and somatic dysfunction of cervical region: Secondary | ICD-10-CM | POA: Diagnosis not present

## 2020-04-11 DIAGNOSIS — G43001 Migraine without aura, not intractable, with status migrainosus: Secondary | ICD-10-CM | POA: Diagnosis not present

## 2020-04-14 ENCOUNTER — Other Ambulatory Visit: Payer: Self-pay | Admitting: Neurology

## 2020-05-03 DIAGNOSIS — M5033 Other cervical disc degeneration, cervicothoracic region: Secondary | ICD-10-CM | POA: Diagnosis not present

## 2020-05-03 DIAGNOSIS — G43001 Migraine without aura, not intractable, with status migrainosus: Secondary | ICD-10-CM | POA: Diagnosis not present

## 2020-05-03 DIAGNOSIS — M9901 Segmental and somatic dysfunction of cervical region: Secondary | ICD-10-CM | POA: Diagnosis not present

## 2020-05-03 DIAGNOSIS — M9902 Segmental and somatic dysfunction of thoracic region: Secondary | ICD-10-CM | POA: Diagnosis not present

## 2020-05-25 ENCOUNTER — Other Ambulatory Visit: Payer: Self-pay | Admitting: Neurology

## 2020-05-25 IMAGING — MR MR HEAD WO/W CM
12 series · 48 of 48 positions shown · IV contrast (multihance)
Comparison: CT head 09/05/2017

CLINICAL DATA: Binocular vision disorder with diplopia

EXAM:
MRI HEAD WITHOUT AND WITH CONTRAST
TECHNIQUE: Multiplanar, multiecho pulse sequences of the brain and surrounding
structures were obtained without and with intravenous contrast.
CONTRAST:  20mL MULTIHANCE GADOBENATE DIMEGLUMINE 529 MG/ML IV SOLN

[Series 2: t1_se_sag · sagittal · 5.0mm · 0.45mm/px · 3 of 23 slices shown]
[im 1/23]
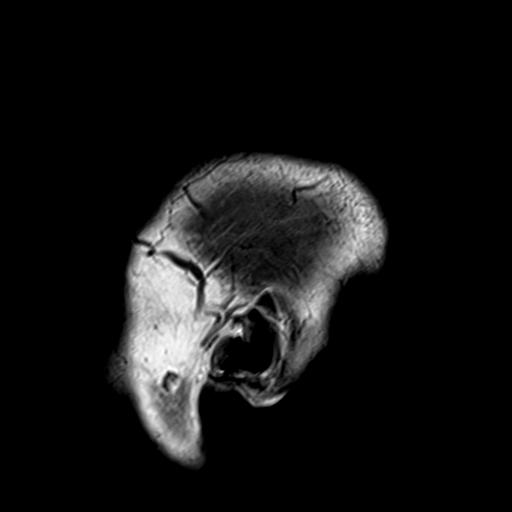
[im 12/23]
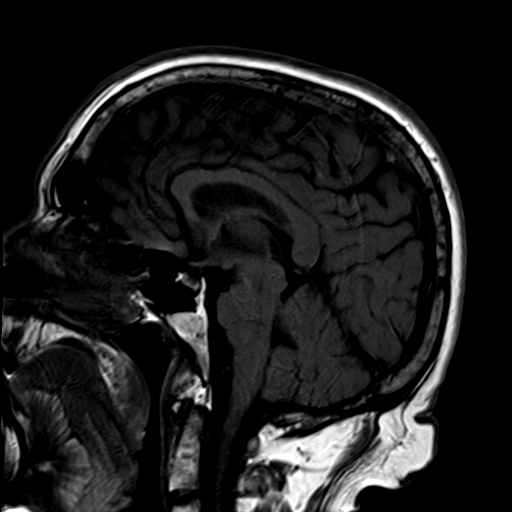
[im 23/23]
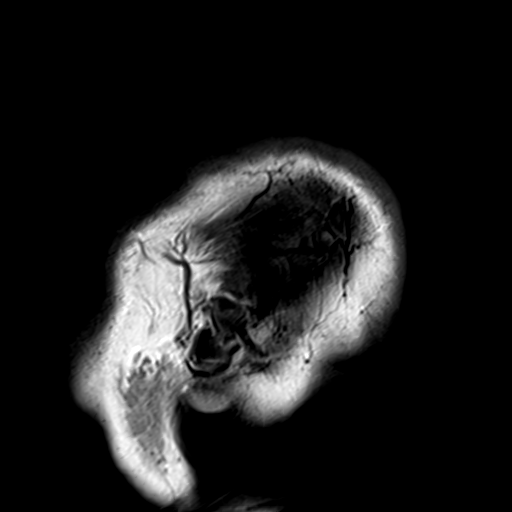

[Series 3: ep2d_diff_(id)_trace · axial · 3.0mm · 1.80mm/px · z∈[-26,+106]mm · 6 of 96 slices shown]
[im 1/96]
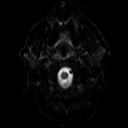
[im 20/96]
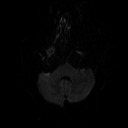
[im 39/96]
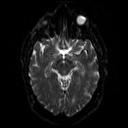
[im 58/96]
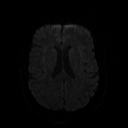
[im 77/96]
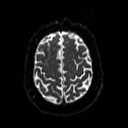
[im 96/96]
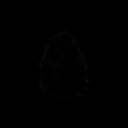

[Series 4: ep2d_diff_(id)_trace_adc · axial · 3.0mm · 1.80mm/px · z∈[-26,+106]mm · 3 of 46 slices shown]
[im 1/46]
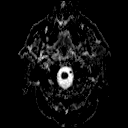
[im 23/46]
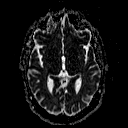
[im 46/46]
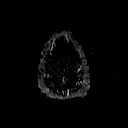

[Series 5: FLAIR · axial · 3.0mm · 0.45mm/px · z∈[-47,+127]mm · 2 of 32 slices shown]
[im 1/32]
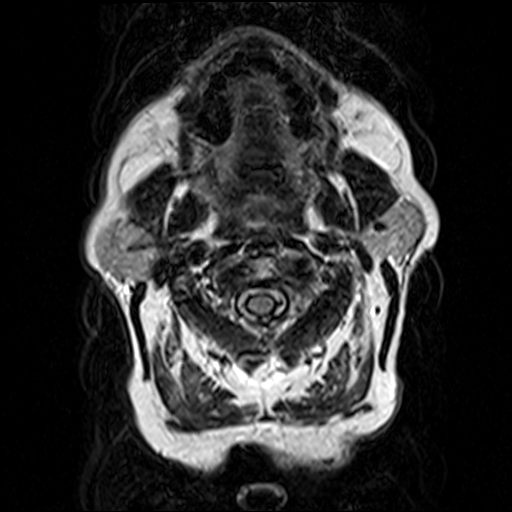
[im 32/32]
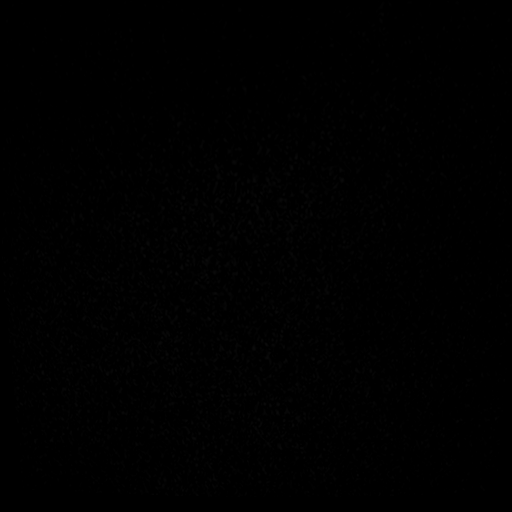

[Series 6: t2_tse_tra · axial · 5.0mm · 0.60mm/px · z∈[-30,+111]mm · 2 of 26 slices shown]
[im 1/26]
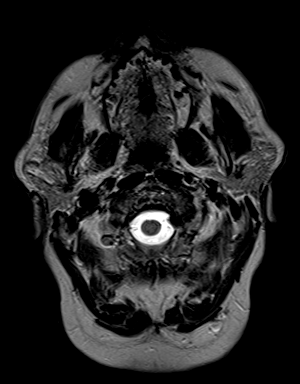
[im 26/26]
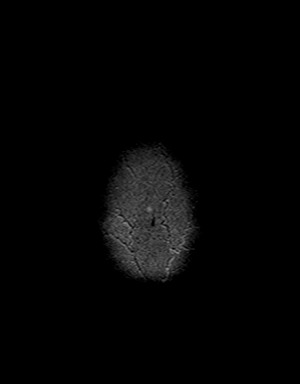

[Series 8: swi_images · axial · 2.0mm · 0.90mm/px · z∈[-33,+115]mm · 5 of 80 slices shown]
[im 1/80]
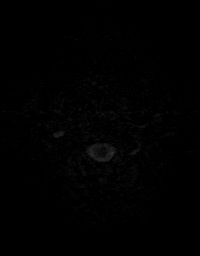
[im 20/80]
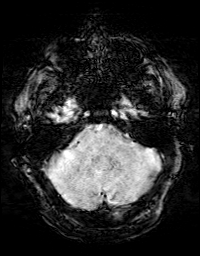
[im 40/80]
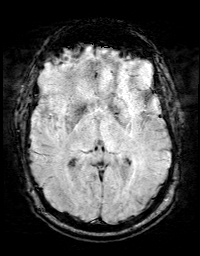
[im 60/80]
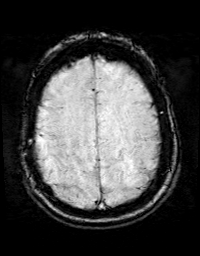
[im 80/80]
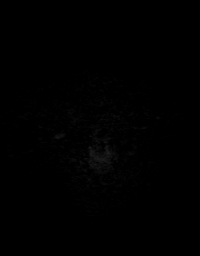

[Series 9: t1_mpr_tra · axial · 1.0mm · 0.72mm/px · z∈[-26,+108]mm · 9 of 144 slices shown]
[im 1/144]
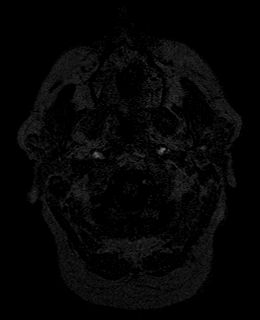
[im 18/144]
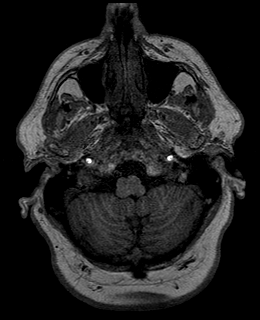
[im 36/144]
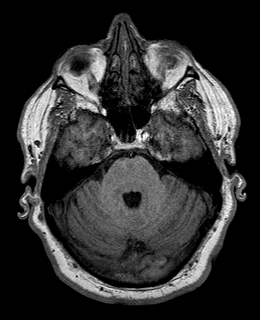
[im 54/144]
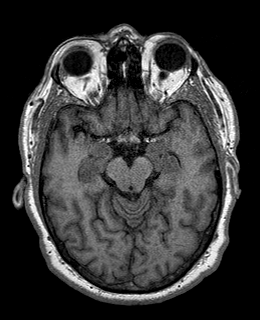
[im 72/144]
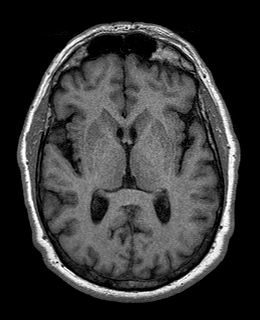
[im 90/144]
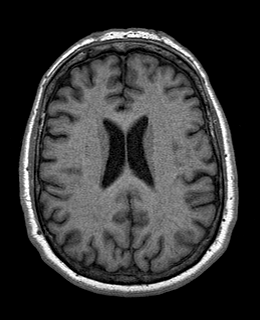
[im 108/144]
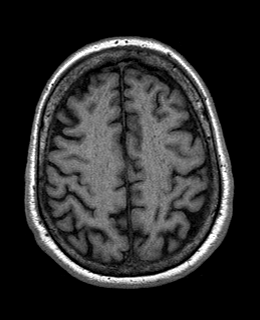
[im 126/144]
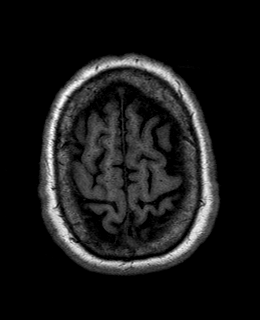
[im 144/144]
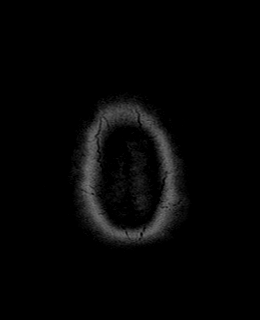

[Series 10: ep2d_diff_cor · coronal · 5.0mm · 1.77mm/px · 3 of 52 slices shown]
[im 1/52]
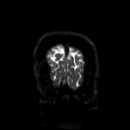
[im 26/52]
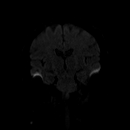
[im 52/52]
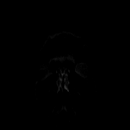

[Series 11: ep2d_diff_cor_adc · coronal · 5.0mm · 1.77mm/px · 2 of 26 slices shown]
[im 1/26]
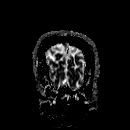
[im 26/26]
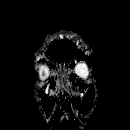

[Series 13: post t1_mpr_tra · axial · 1.0mm · 0.72mm/px · z∈[-26,+108]mm · 9 of 144 slices shown]
[im 1/144]
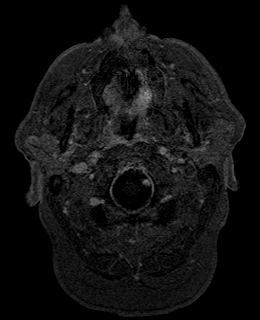
[im 18/144]
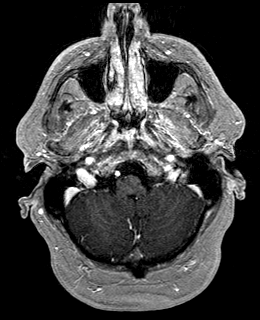
[im 36/144]
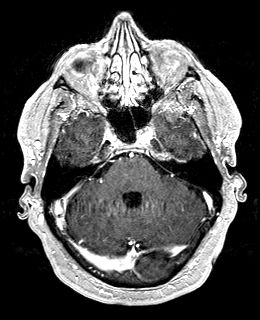
[im 54/144]
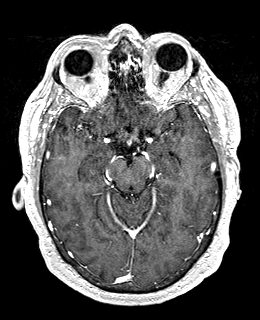
[im 72/144]
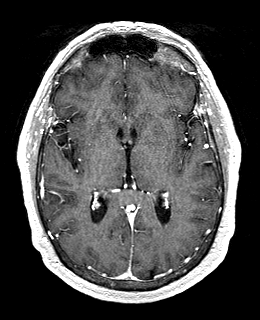
[im 90/144]
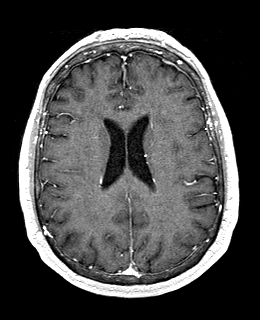
[im 108/144]
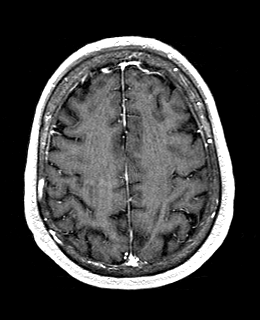
[im 126/144]
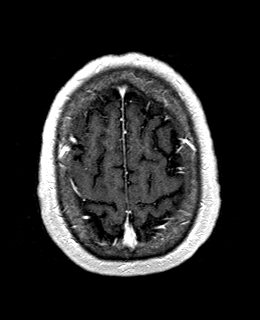
[im 144/144]
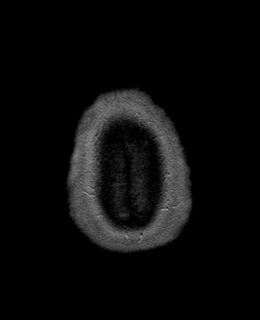

[Series 14: T1 post-contrast · coronal · 5.0mm · 0.72mm/px · 2 of 30 slices shown]
[im 1/30]
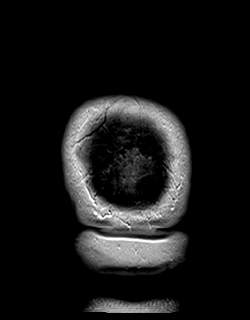
[im 30/30]
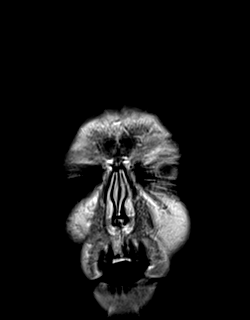

[Series 15: T2 post-contrast · coronal · 5.0mm · 0.45mm/px · 2 of 30 slices shown]
[im 1/30]
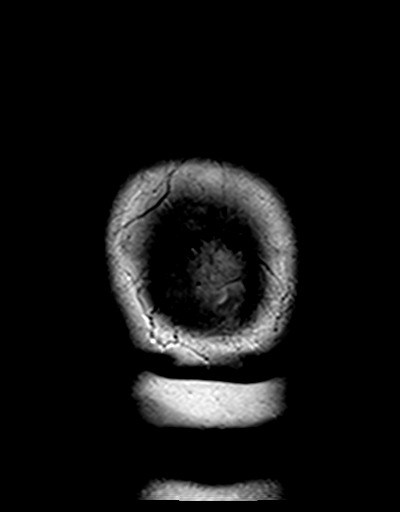
[im 30/30]
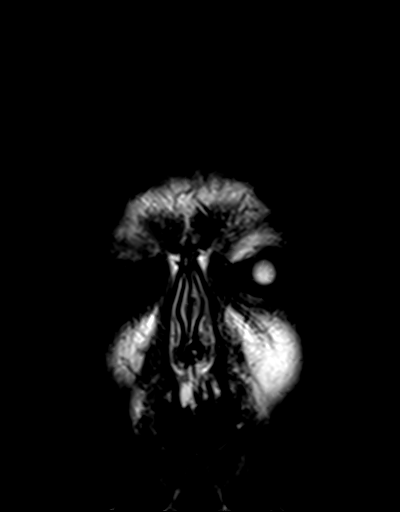

[48 of 48 positions shown; findings below may reference images not displayed]

FINDINGS: Brain: Ventricle size and cerebral volume normal for age. Several
tiny white matter hyperintensities are present bilaterally. Negative
for acute infarct. Negative for hemorrhage mass or fluid collection.
Normal enhancement postcontrast administration.

Vascular: Normal arterial flow voids

Skull and upper cervical spine: Negative

Sinuses/Orbits: Mild mucosal edema paranasal sinuses.

Normal orbit.  No orbital mass.  No compression of the optic chiasm.

Other: None
IMPRESSION: No acute abnormality.

Mild chronic white matter changes, less than expected for age.

## 2020-05-31 ENCOUNTER — Other Ambulatory Visit: Payer: Self-pay | Admitting: Neurology

## 2020-05-31 ENCOUNTER — Other Ambulatory Visit: Payer: Self-pay | Admitting: *Deleted

## 2020-05-31 DIAGNOSIS — G43001 Migraine without aura, not intractable, with status migrainosus: Secondary | ICD-10-CM | POA: Diagnosis not present

## 2020-05-31 DIAGNOSIS — M9901 Segmental and somatic dysfunction of cervical region: Secondary | ICD-10-CM | POA: Diagnosis not present

## 2020-05-31 DIAGNOSIS — M9902 Segmental and somatic dysfunction of thoracic region: Secondary | ICD-10-CM | POA: Diagnosis not present

## 2020-05-31 DIAGNOSIS — M5033 Other cervical disc degeneration, cervicothoracic region: Secondary | ICD-10-CM | POA: Diagnosis not present

## 2020-05-31 MED ORDER — CLOPIDOGREL BISULFATE 75 MG PO TABS: 75.0000 mg | ORAL_TABLET | Freq: Every day | ORAL | 1 refills | Status: DC

## 2020-06-21 DIAGNOSIS — G43001 Migraine without aura, not intractable, with status migrainosus: Secondary | ICD-10-CM | POA: Diagnosis not present

## 2020-06-21 DIAGNOSIS — M9902 Segmental and somatic dysfunction of thoracic region: Secondary | ICD-10-CM | POA: Diagnosis not present

## 2020-06-21 DIAGNOSIS — M9901 Segmental and somatic dysfunction of cervical region: Secondary | ICD-10-CM | POA: Diagnosis not present

## 2020-06-21 DIAGNOSIS — M5033 Other cervical disc degeneration, cervicothoracic region: Secondary | ICD-10-CM | POA: Diagnosis not present

## 2020-06-26 ENCOUNTER — Telehealth: Payer: Self-pay | Admitting: Family Medicine

## 2020-06-26 NOTE — Progress Notes (Signed)
  Chronic Care Management   Note  06/26/2020 Name: Todd Patterson MRN: [id number] DOB: 06-09-1950  [name] is a 70 y.o. year old male who is a primary care patient of Dennard Schaumann, Cammie Mcgee, MD. I reached out to [name] by phone today in response to a referral sent by [name] PCP, [name], MD.   Mr. Matsumoto was given information about Chronic Care Management services today including:  1. CCM service includes personalized support from designated clinical staff supervised by his physician, including individualized plan of care and coordination with other care providers 2. 24/7 contact phone numbers for assistance for urgent and routine care needs. 3. Service will only be billed when office clinical staff spend 20 minutes or more in a month to coordinate care. 4. Only one practitioner may furnish and bill the service in a calendar month. 5. The patient may stop CCM services at any time (effective at the end of the month) by phone call to the office staff.   Patient agreed to services and verbal consent obtained.   Follow up plan:   Carley Perdue [employer] Scheduler

## 2020-07-06 ENCOUNTER — Telehealth: Payer: Self-pay | Admitting: Pharmacist

## 2020-07-06 NOTE — Progress Notes (Addendum)
Chronic Care Management Pharmacy Note  07/12/2020 Name:  Todd Patterson MRN:  [id number] DOB:  Dec 12, 1950  Subjective: Todd Patterson is an 70 y.o. year old male who is a primary patient of Pickard, Cammie Mcgee, MD.  The CCM team was consulted for assistance with disease management and care coordination needs.    Engaged with patient by telephone for initial visit in response to provider referral for pharmacy case management and/or care coordination services.   Consent to Services:  The patient was given the following information about Chronic Care Management services today, agreed to services, and gave verbal consent: 1. CCM service includes personalized support from designated clinical staff supervised by the primary care provider, including individualized plan of care and coordination with other care providers 2. 24/7 contact phone numbers for assistance for urgent and routine care needs. 3. Service will only be billed when office clinical staff spend 20 minutes or more in a month to coordinate care. 4. Only one practitioner may furnish and bill the service in a calendar month. 5.The patient may stop CCM services at any time (effective at the end of the month) by phone call to the office staff. 6. The patient will be responsible for cost sharing (co-pay) of up to 20% of the service fee (after annual deductible is met). Patient agreed to services and consent obtained.  Patient Care Team: [name], MD as PCP - General (Family Medicine) Danice Goltz, MD as Consulting Physician (Ophthalmology) Edythe Clarity, [facility] as Pharmacist (Pharmacist)  Recent office visits: None in the last 6 months  Recent consult visits: 2/16//22 Chiropractic Medicine Iris Pert. No information given. 04/11/20 Chiropractic Medicine Iris Pert. No information given. 03/08/20 Chiropractic Medicine Iris Pert. No information given. 12/01//21 Chiropractic Medicine Iris Pert. No information  given. 02/04/20 Urology  [facility] No information given. 01/26/20 Chiropractic Medicine Iris Pert. No information given.   Hospital visits: None in previous 6 months  Objective:  Lab Results  Component Value Date   CREATININE 0.74 12/16/2019   BUN 14 12/16/2019   GFRNONAA 94 12/16/2019   GFRAA 109 12/16/2019   NA 141 12/16/2019   K 4.7 12/16/2019   CALCIUM 9.8 12/16/2019   CO2 27 12/16/2019   GLUCOSE 91 12/16/2019    Lab Results  Component Value Date/Time   HGBA1C 5.6 09/02/2019 04:55 PM    Last diabetic Eye exam: No results found for: HMDIABEYEEXA  Last diabetic Foot exam: No results found for: HMDIABFOOTEX   Lab Results  Component Value Date   CHOL 174 12/16/2019   HDL 67 12/16/2019   LDLCALC 84 12/16/2019   TRIG 134 12/16/2019   CHOLHDL 2.6 12/16/2019    Hepatic Function Latest Ref Rng & Units 12/16/2019 07/08/2019 11/18/2018  Total Protein 6.1 - 8.1 g/dL 6.6 7.0 6.5  Albumin 3.8 - 4.8 g/dL - 4.4 -  AST 10 - 35 U/L 16 19 15   ALT 9 - 46 U/L 23 26 24   Alk Phosphatase 39 - 117 IU/L - 70 -  Total Bilirubin 0.2 - 1.2 mg/dL 0.5 0.6 0.7  Bilirubin, Direct 0.00 - 0.40 mg/dL - 0.19 -    Lab Results  Component Value Date/Time   TSH 2.820 09/02/2019 04:55 PM   TSH 1.12 11/18/2018 08:04 AM    CBC Latest Ref Rng & Units 12/16/2019 11/30/2018 11/18/2018  WBC 3.8 - 10.8 Thousand/uL 11.3(H) 10.9(H) 12.9(H)  Hemoglobin 13.2 - 17.1 g/dL 15.4 15.4 15.9  Hematocrit 38.5 - 50.0 % 45.2  45.7 46.7  Platelets 140 - 400 Thousand/uL 276 251 270    No results found for: VD25OH  Clinical ASCVD: Yes  The ASCVD Risk score Mikey Bussing DC Jr., et al., 2013) failed to calculate for the following reasons:   The patient has a prior MI or stroke diagnosis    Depression screen [facility] 2/9 12/01/2017 12/31/2016  Decreased Interest 0 0  Down, Depressed, Hopeless 0 0  PHQ - 2 Score 0 0     Social History   Tobacco Use  Smoking Status Former Smoker  . Types: Cigarettes   . Quit date: 03/19/2007  . Years since quitting: 13.3  Smokeless Tobacco Never Used   BP Readings from Last 3 Encounters:  04/01/20 (!) 159/77  12/16/19 140/90  11/18/19 118/80   Pulse Readings from Last 3 Encounters:  04/01/20 67  12/16/19 64  11/18/19 68   Wt Readings from Last 3 Encounters:  12/16/19 218 lb (98.9 kg)  11/18/19 217 lb (98.4 kg)  09/21/19 214 lb (97.1 kg)   BMI Readings from Last 3 Encounters:  12/16/19 31.28 kg/m  11/18/19 31.14 kg/m  09/21/19 30.71 kg/m    Assessment/Interventions: Review of patient past medical history, allergies, medications, health status, including review of consultants reports, laboratory and other test data, was performed as part of comprehensive evaluation and provision of chronic care management services.   SDOH:  (Social Determinants of Health) assessments and interventions performed: No   Financial Resource Strain: Low Risk   . Difficulty of Paying Living Expenses: Not very hard    SDOH Screenings   Alcohol Screen: Not on file  Depression (HWE9-9): Not on file  Financial Resource Strain: Low Risk   . Difficulty of Paying Living Expenses: Not very hard  Food Insecurity: Not on file  Housing: Not on file  Physical Activity: Not on file  Social Connections: Not on file  Stress: Not on file  Tobacco Use: Medium Risk  . Smoking Tobacco Use: Former Smoker  . Smokeless Tobacco Use: Never Used  Transportation Needs: Not on file    CCM Care Plan  Allergies  Allergen Reactions  . Lisinopril Cough    Medications Reviewed Today    Reviewed by Edythe Clarity, [facility] (Pharmacist) on 07/10/20 at [place] List Status: <None>  Medication Order Taking? Sig Documenting Provider Last Dose Status Informant  atorvastatin (LIPITOR) 80 MG tablet [id number] Yes Take 1 tablet (80 mg total) by mouth daily. [name], MD Taking Active   carvedilol (COREG) 6.25 MG tablet [id number] Yes TAKE 1 TABLET BY MOUTH TWICE A DAY  [name], MD Taking Active   clopidogrel (PLAVIX) 75 MG tablet [id number] Yes Take 1 tablet (75 mg total) by mouth daily. Please request future refills from PCP. [name], MD Taking Active   latanoprost (XALATAN) 0.005 % ophthalmic solution [id number] Yes Place 1 drop into both eyes at bedtime. [provider] Taking Active   losartan (COZAAR) 25 MG tablet [id number] Yes TAKE 2 TABLETS BY MOUTH EVERY DAY [name], MD Taking Active   timolol (TIMOPTIC) 0.5 % ophthalmic solution [id number] Yes Place 1 drop into both eyes 2 (two) times daily. [provider] Taking Active           Patient Active Problem List   Diagnosis Date Noted  . Cerebrovascular accident (CVA) ([place]) 09/02/2019  . 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 Primary hyperparathyroidism ([place]) 07/04/2016  . Hyperlipidemia 04/26/2016  . Essential hypertension 04/26/2016  .  CAD (coronary artery disease)     Immunization History  Administered Date(s) Administered  . Fluad Quad(high Dose 65+) 11/18/2018, 12/16/2019  . Influenza,inj,Quad PF,6+ Mos 02/06/2016, 12/31/2016, 12/01/2017  . PFIZER(Purple Top)SARS-COV-2 Vaccination 04/23/2019, 05/14/2019  . Pneumococcal Conjugate-13 01/16/2017  . Pneumococcal Polysaccharide-23 02/06/2016    Conditions to be addressed/monitored:  CAD, HTN, Hyperlipidemia, CVA  Care Plan : General Pharmacy (Adult)  Updates made by Edythe Clarity, RPH since 07/12/2020 12:00 AM    Problem: HTN, HLD, CVA   Priority: High  Onset Date: 07/10/2020    Long-Range Goal: Patient-Specific Goal   Start Date: 07/12/2020  Expected End Date: 01/11/2021  This Visit's Progress: On track  Priority: High  Note:   Current Barriers:  . No specific barriers to medication at this time.  Pharmacist Clinical Goal(s):  Marland Kitchen Patient will achieve adherence to monitoring guidelines and medication adherence to achieve therapeutic efficacy . maintain control of BP as evidenced by  home monitoring  . contact provider office for questions/concerns as evidenced notation of same in electronic health record through collaboration with PharmD and provider.   Interventions: . 1:1 collaboration with [name], MD regarding development and update of comprehensive plan of care as evidenced by provider attestation and co-signature . Inter-disciplinary care team collaboration (see longitudinal plan of care) . Comprehensive medication review performed; medication list updated in electronic medical record  Hypertension (BP goal <140/90) -Controlled -Current treatment: . Carvedilol 6.43m BID . Losartan 255mdaily -Medications previously tried: none noted  -Current home readings: patient was at work so could -Current exercise habits: patient reports he is walking outside as the weather starts to warm up. -Denies hypotensive/hypertensive symptoms -Educated on BP goals and benefits of medications for prevention of heart attack, stroke and kidney damage; Exercise goal of 150 minutes per week; Importance of home blood pressure monitoring;  -patient reports he is working on losing some weight -Counseled to monitor BP at home daily, document, and provide log at future appointments -Recommended to continue current medication  Hyperlipidemia/CAD/Hx of CVA: (LDL goal < 70) -Not ideally controlled -Current treatment: . Atorvastatin 8034maily . Clopidogrel 16m20mily -Medications previously tried: simvastatin  -Current exercise habits: see above -Educated on Cholesterol goals;  Benefits of statin for ASCVD risk reduction; Importance of limiting foods high in cholesterol; Exercise goal of 150 minutes per week;  -Reviewed most recent lipid panel, goal < 70 with hx of CVA -Recommended to continue current medication Recommended repeat lipid panel, would consider addition of Zetia if LDL still elevated.  Patient is working on exercise/diet to lose weigh.   Patient  Goals/Self-Care Activities . Patient will:  - take medications as prescribed focus on medication adherence by pill count check blood pressure daily, document, and provide at future appointments  Follow Up Plan: The care management team will reach out to the patient again over the next 180 days.        Medication Assistance: None required.  Patient affirms current coverage meets needs.  Patient's preferred pharmacy is:  [id number] [place] [address]: [phone]/pharmacy #[id number] [address] [place] [address] [address]: [phone] 100% compliance  We discussed: Benefits of medication synchronization, packaging and delivery as well as enhanced pharmacist oversight with [employer]. Patient decided to: Continue current medication management strategy  Care  Plan and Follow Up Patient Decision:  Patient agrees to Care Plan and Follow-up.  Plan: The care management team will reach out to the patient again over the next 180 days.  Beverly Milch, PharmD Clinical Pharmacist [place] [phone]

## 2020-07-06 NOTE — Progress Notes (Addendum)
    Chronic Care Management Pharmacy Assistant   Name: Unknown Schleyer  MRN: [id number] DOB: 22-Aug-1950  Todd Patterson is an 70 y.o. year old male who presents for his initial CCM visit with the clinical pharmacist.  Reason for Encounter: Chart Prep   Conditions to be addressed/monitored: CAD, HTN, Hyperlipidemia, CVA  Primary concerns for visit include:  Recent office visits:  None in the last 6 months  Recent consult visits: 2/16//22 Chiropractic Medicine Iris Pert. No information given. 04/11/20 Chiropractic Medicine Iris Pert. No information given. 03/08/20 Chiropractic Medicine Iris Pert. No information given. 12/01//21 Chiropractic Medicine Iris Pert. No information given. 02/04/20 Urology  [facility] No information given. 01/26/20 Chiropractic Medicine Iris Pert. No information given.  Hospital visits:  None in previous 6 months   Medication History: Atorvastatin 80 mg 90 DS 04/15/20  Losartan 25 mg 90 DS 05/25/20  Medications: Outpatient Encounter Medications as of 07/06/2020  Medication Sig   atorvastatin (LIPITOR) 80 MG tablet Take 1 tablet (80 mg total) by mouth daily.   carvedilol (COREG) 6.25 MG tablet TAKE 1 TABLET BY MOUTH TWICE A DAY   clopidogrel (PLAVIX) 75 MG tablet Take 1 tablet (75 mg total) by mouth daily. Please request future refills from PCP.   latanoprost (XALATAN) 0.005 % ophthalmic solution Place 1 drop into both eyes at bedtime.   losartan (COZAAR) 25 MG tablet TAKE 2 TABLETS BY MOUTH EVERY DAY   timolol (TIMOPTIC) 0.5 % ophthalmic solution Place 1 drop into both eyes 2 (two) times daily.   No facility-administered encounter medications on file as of 07/06/2020.    Have you seen any other providers since your last visit? Patient stated no  Any changes in your medications or health? Patient stated no.  Any side effects from any medications? Patient stated no   Do you have an symptoms or problems not managed by  your medications? Patient stated no  Any concerns about your health right now? Patient stated no.  Has your provider asked that you check blood pressure, blood sugar, or follow special diet at home? Patient stated no but he does check his blood pressure 2-3 times a week.  Do you get any type of exercise on a regular basis? Patient stated he walks about 3-4 miles daily rather that be at work or at home in the yard.  Can you think of a goal you would like to reach for your health? Patient stated to loose weight.  Do you have any problems getting your medications? Patient stated no  Is there anything that you would like to discuss during the appointment? Patient stated no.  Please bring medications and supplements to appointment, patient reminded of face to face appointment on 4/25 at 330 pm but patient wants to change appointment to OTP he has to work on 4/25.  Star Rating Drugs: Atorvastatin 80 mg 90 DS 04/15/20 Losartan 25 mg 90 DS 05/25/20  Follow-Up:Pharmacist Review   Charlann Lange, [place] Clinical Pharmacist Assistant [phone]

## 2020-07-10 ENCOUNTER — Ambulatory Visit (INDEPENDENT_AMBULATORY_CARE_PROVIDER_SITE_OTHER): Payer: Medicare HMO | Admitting: Pharmacist

## 2020-07-10 DIAGNOSIS — E785 Hyperlipidemia, unspecified: Secondary | ICD-10-CM

## 2020-07-10 DIAGNOSIS — I2510 Atherosclerotic heart disease of native coronary artery without angina pectoris: Secondary | ICD-10-CM | POA: Diagnosis not present

## 2020-07-10 DIAGNOSIS — I10 Essential (primary) hypertension: Secondary | ICD-10-CM | POA: Diagnosis not present

## 2020-07-10 NOTE — Patient Instructions (Addendum)
Visit Information  Goals Addressed            This Visit's Progress   . Track and Manage My Blood Pressure-Hypertension       Timeframe:  Long-Range Goal Priority:  High Start Date:   07/10/20                          Expected End Date:  01/09/21                     Follow Up Date 10/09/20   - check blood pressure 3 times per week - choose a place to take my blood pressure (home, clinic or office, retail store) - write blood pressure results in a log or diary    Why is this important?    You won't feel high blood pressure, but it can still hurt your blood vessels.   High blood pressure can cause heart or kidney problems. It can also cause a stroke.   Making lifestyle changes like losing a little weight or eating less salt will help.   Checking your blood pressure at home and at different times of the day can help to control blood pressure.   If the doctor prescribes medicine remember to take it the way the doctor ordered.   Call the office if you cannot afford the medicine or if there are questions about it.     Notes:       Patient Care Plan: General Pharmacy (Adult)    Problem Identified: HTN, HLD, CVA   Priority: High  Onset Date: 07/10/2020    Long-Range Goal: Patient-Specific Goal   Start Date: 07/12/2020  Expected End Date: 01/11/2021  This Visit's Progress: On track  Priority: High  Note:   Current Barriers:  . No specific barriers to medication at this time.  Pharmacist Clinical Goal(s):  Marland Kitchen Patient will achieve adherence to monitoring guidelines and medication adherence to achieve therapeutic efficacy . maintain control of BP as evidenced by home monitoring  . contact provider office for questions/concerns as evidenced notation of same in electronic health record through collaboration with PharmD and provider.   Interventions: . 1:1 collaboration with [name], MD regarding development and update of comprehensive plan of care as evidenced by  provider attestation and co-signature . Inter-disciplinary care team collaboration (see longitudinal plan of care) . Comprehensive medication review performed; medication list updated in electronic medical record  Hypertension (BP goal <140/90) -Controlled -Current treatment: . Carvedilol 6.25mg  BID . Losartan 25mg  daily -Medications previously tried: none noted  -Current home readings: patient was at work so could -Current exercise habits: patient reports he is walking outside as the weather starts to warm up. -Denies hypotensive/hypertensive symptoms -Educated on BP goals and benefits of medications for prevention of heart attack, stroke and kidney damage; Exercise goal of 150 minutes per week; Importance of home blood pressure monitoring;  -patient reports he is working on losing some weight -Counseled to monitor BP at home daily, document, and provide log at future appointments -Recommended to continue current medication  Hyperlipidemia/CAD/Hx of CVA: (LDL goal < 70) -Not ideally controlled -Current treatment: . Atorvastatin 80mg  daily . Clopidogrel 75mg  daily -Medications previously tried: simvastatin  -Current exercise habits: see above -Educated on Cholesterol goals;  Benefits of statin for ASCVD risk reduction; Importance of limiting foods high in cholesterol; Exercise goal of 150 minutes per week;  -Reviewed most recent lipid panel, goal < 70 with  hx of CVA -Recommended to continue current medication Recommended repeat lipid panel, would consider addition of Zetia if LDL still elevated.  Patient is working on exercise/diet to lose weigh.   Patient Goals/Self-Care Activities . Patient will:  - take medications as prescribed focus on medication adherence by pill count check blood pressure daily, document, and provide at future appointments  Follow Up Plan: The care management team will reach out to the patient again over the next 180 days.       Todd Patterson was  given information about Chronic Care Management services today including:  1. CCM service includes personalized support from designated clinical staff supervised by his physician, including individualized plan of care and coordination with other care providers 2. 24/7 contact phone numbers for assistance for urgent and routine care needs. 3. Standard insurance, coinsurance, copays and deductibles apply for chronic care management only during months in which we provide at least 20 minutes of these services. Most insurances cover these services at 100%, however patients may be responsible for any copay, coinsurance and/or deductible if applicable. This service may help you avoid the need for more expensive face-to-face services. 4. Only one practitioner may furnish and bill the service in a calendar month. 5. The patient may stop CCM services at any time (effective at the end of the month) by phone call to the office staff.  Patient agreed to services and verbal consent obtained.   The patient verbalized understanding of instructions, educational materials, and care plan provided today and agreed to receive a mailed copy of patient instructions, educational materials, and care plan.  Telephone follow up appointment with pharmacy team member scheduled for: 6 months  Edythe Clarity, [facility]  Managing Your Hypertension Hypertension, also called high blood pressure, is when the force of the blood pressing against the walls of the arteries is too strong. Arteries are blood vessels that carry blood from your heart throughout your body. Hypertension forces the heart to work harder to pump blood and may cause the arteries to become narrow or stiff. Understanding blood pressure readings Your personal target blood pressure may vary depending on your medical conditions, your age, and other factors. A blood pressure reading includes a higher number over a lower number. Ideally, your blood pressure should be below  120/80. You should know that:  The first, or top, number is called the systolic pressure. It is a measure of the pressure in your arteries as your heart beats.  The second, or bottom number, is called the diastolic pressure. It is a measure of the pressure in your arteries as the heart relaxes. Blood pressure is classified into four stages. Based on your blood pressure reading, your health care provider may use the following stages to determine what type of treatment you need, if any. Systolic pressure and diastolic pressure are measured in a unit called mmHg. Normal  Systolic pressure: below 123456.  Diastolic pressure: below 80. Elevated  Systolic pressure: [id number].  Diastolic pressure: below 80. Hypertension stage 1  Systolic pressure: 0000000.  Diastolic pressure: [id number]. Hypertension stage 2  Systolic pressure: [id number] or above.  Diastolic pressure: 90 or above. How can this condition affect me? Managing your hypertension is an important responsibility. Over time, hypertension can damage the arteries and decrease blood flow to important parts of the body, including the brain, heart, and kidneys. Having untreated or uncontrolled hypertension can lead to:  A heart attack.  A stroke.  A weakened blood vessel (aneurysm).  Heart failure.  Kidney damage.  Eye damage.  Metabolic syndrome.  Memory and concentration problems.  Vascular dementia. What actions can I take to manage this condition? Hypertension can be managed by making lifestyle changes and possibly by taking medicines. Your health care provider will help you make a plan to bring your blood pressure within a normal range. Nutrition  Eat a diet that is high in fiber and potassium, and low in salt (sodium), added sugar, and fat. An example eating plan is called the Dietary Approaches to Stop Hypertension (DASH) diet. To eat this way: ? Eat plenty of fresh fruits and vegetables. Try to fill one-half of your plate at  each meal with fruits and vegetables. ? Eat whole grains, such as whole-wheat pasta, brown rice, or whole-grain bread. Fill about one-fourth of your plate with whole grains. ? Eat low-fat dairy products. ? Avoid fatty cuts of meat, processed or cured meats, and poultry with skin. Fill about one-fourth of your plate with lean proteins such as fish, chicken without skin, beans, eggs, and tofu. ? Avoid pre-made and processed foods. These tend to be higher in sodium, added sugar, and fat.  Reduce your daily sodium intake. Most people with hypertension should eat less than 1,500 mg of sodium a day.   Lifestyle  Work with your health care provider to maintain a healthy body weight or to lose weight. Ask what an ideal weight is for you.  Get at least 30 minutes of exercise that causes your heart to beat faster (aerobic exercise) most days of the week. Activities may include walking, swimming, or biking.  Include exercise to strengthen your muscles (resistance exercise), such as weight lifting, as part of your weekly exercise routine. Try to do these types of exercises for 30 minutes at least 3 days a week.  Do not use any products that contain nicotine or tobacco, such as cigarettes, e-cigarettes, and chewing tobacco. If you need help quitting, ask your health care provider.  Control any long-term (chronic) conditions you have, such as high cholesterol or diabetes.  Identify your sources of stress and find ways to manage stress. This may include meditation, deep breathing, or making time for fun activities.   Alcohol use  Do not drink alcohol if: ? Your health care provider tells you not to drink. ? You are pregnant, may be pregnant, or are planning to become pregnant.  If you drink alcohol: ? Limit how much you use to:  0-1 drink a day for women.  0-2 drinks a day for men. ? Be aware of how much alcohol is in your drink. In the [place] one drink equals one 12 oz bottle of beer (355 mL), one 5  oz glass of wine (148 mL), or one 1 oz glass of hard liquor (44 mL). Medicines Your health care provider may prescribe medicine if lifestyle changes are not enough to get your blood pressure under control and if:  Your systolic blood pressure is 130 or higher.  Your diastolic blood pressure is 80 or higher. Take medicines only as told by your health care provider. Follow the directions carefully. Blood pressure medicines must be taken as told by your health care provider. The medicine does not work as well when you skip doses. Skipping doses also puts you at risk for problems. Monitoring Before you monitor your blood pressure:  Do not smoke, drink caffeinated beverages, or exercise within 30 minutes before taking a measurement.  Use the bathroom and empty your bladder (urinate).  Sit  quietly for at least 5 minutes before taking measurements. Monitor your blood pressure at home as told by your health care provider. To do this:  Sit with your back straight and supported.  Place your feet flat on the floor. Do not cross your legs.  Support your arm on a flat surface, such as a table. Make sure your upper arm is at heart level.  Each time you measure, take two or three readings one minute apart and record the results. You may also need to have your blood pressure checked regularly by your health care provider.   General information  Talk with your health care provider about your diet, exercise habits, and other lifestyle factors that may be contributing to hypertension.  Review all the medicines you take with your health care provider because there may be side effects or interactions.  Keep all visits as told by your health care provider. Your health care provider can help you create and adjust your plan for managing your high blood pressure. Where to find more information  National Heart, Lung, and Blood Institute: https://wilson-eaton.com/  American Heart Association:  www.heart.org Contact a health care provider if:  You think you are having a reaction to medicines you have taken.  You have repeated (recurrent) headaches.  You feel dizzy.  You have swelling in your ankles.  You have trouble with your vision. Get help right away if:  You develop a severe headache or confusion.  You have unusual weakness or numbness, or you feel faint.  You have severe pain in your chest or abdomen.  You vomit repeatedly.  You have trouble breathing. These symptoms may represent a serious problem that is an emergency. Do not wait to see if the symptoms will go away. Get medical help right away. Call your local emergency services (911 in the [place] Do not drive yourself to the hospital. Summary  Hypertension is when the force of blood pumping through your arteries is too strong. If this condition is not controlled, it may put you at risk for serious complications.  Your personal target blood pressure may vary depending on your medical conditions, your age, and other factors. For most people, a normal blood pressure is less than 120/80.  Hypertension is managed by lifestyle changes, medicines, or both.  Lifestyle changes to help manage hypertension include losing weight, eating a healthy, low-sodium diet, exercising more, stopping smoking, and limiting alcohol. This information is not intended to replace advice given to you by your health care provider. Make sure you discuss any questions you have with your health care provider. Document Revised: 04/09/2019 Document Reviewed: 02/02/2019 Elsevier Patient Education  2021 [employer].

## 2020-07-12 ENCOUNTER — Ambulatory Visit: Payer: Medicare HMO | Admitting: Cardiovascular Disease

## 2020-07-12 ENCOUNTER — Other Ambulatory Visit: Payer: Self-pay

## 2020-07-12 ENCOUNTER — Encounter: Payer: Self-pay | Admitting: Cardiovascular Disease

## 2020-07-12 VITALS — BP 142/86 | HR 58 | Ht 70.0 in | Wt 181.0 lb

## 2020-07-12 DIAGNOSIS — E782 Mixed hyperlipidemia: Secondary | ICD-10-CM

## 2020-07-12 DIAGNOSIS — G43001 Migraine without aura, not intractable, with status migrainosus: Secondary | ICD-10-CM | POA: Diagnosis not present

## 2020-07-12 DIAGNOSIS — I2510 Atherosclerotic heart disease of native coronary artery without angina pectoris: Secondary | ICD-10-CM | POA: Diagnosis not present

## 2020-07-12 DIAGNOSIS — M9902 Segmental and somatic dysfunction of thoracic region: Secondary | ICD-10-CM | POA: Diagnosis not present

## 2020-07-12 DIAGNOSIS — I10 Essential (primary) hypertension: Secondary | ICD-10-CM | POA: Diagnosis not present

## 2020-07-12 DIAGNOSIS — M9901 Segmental and somatic dysfunction of cervical region: Secondary | ICD-10-CM | POA: Diagnosis not present

## 2020-07-12 DIAGNOSIS — M5033 Other cervical disc degeneration, cervicothoracic region: Secondary | ICD-10-CM | POA: Diagnosis not present

## 2020-07-12 NOTE — Assessment & Plan Note (Addendum)
History of hyperlipidemia on high-dose atorvastatin followed by his PCP.  His most recent lab work performed by his PCP 12/16/2019 revealed total cholesterol 174, LDL of 84 and HDL of 67.

## 2020-07-12 NOTE — Assessment & Plan Note (Signed)
History of essential hypertension blood pressure measured today 142/86.  He is on carvedilol and losartan.

## 2020-07-12 NOTE — Patient Instructions (Signed)

## 2020-07-12 NOTE — Progress Notes (Addendum)
07/26/2020 Todd Patterson   March 26, 1950  [id number]  Primary Physician Pickard, Cammie Mcgee, MD Primary Cardiologist: Lorretta Harp MD [name], [place]  HPI:  Todd Patterson is a 70 y.o.  delightful 70 year old mildly overweight married Caucasian male father of 2 daughters, grandfather of 4 grandchildren who was referred by Dr. Dennard Schaumann to be established in our cardiovascular practice for ongoing care.  I last saw him in the office 05/19/2019.  He has a history of treated hypertension and hyperlipidemia as well as discontinue tobacco abuse. He smoked 2 packs a day up until 2010 when he had his heart attack (80 pack years). He also takes several beers a day. He had a heart attack back in 2010 and had LAD stenting in [place] with a drug-eluting stent. He was followed by cardiologist in [place] after that who did a stress echo that was apparently normal. He was relocated from [place] to to be closer to family.  Since I saw him in the office a year ago he is done well.  There was a question of a "ocular stroke" for which she saw neurologist and was placed on Plavix.  He has had no recurrent neurologic symptoms.  He denies chest pain or shortness of breath.  He does work at a golf course and is fairly active walking up to 9 miles over 2-day period.   Current Meds  Medication Sig  . atorvastatin (LIPITOR) 80 MG tablet Take 1 tablet (80 mg total) by mouth daily.  . carvedilol (COREG) 6.25 MG tablet TAKE 1 TABLET BY MOUTH TWICE A DAY  . clopidogrel (PLAVIX) 75 MG tablet Take 1 tablet (75 mg total) by mouth daily. Please request future refills from PCP.  Marland Kitchen latanoprost (XALATAN) 0.005 % ophthalmic solution Place 1 drop into both eyes at bedtime.  Marland Kitchen losartan (COZAAR) 25 MG tablet TAKE 2 TABLETS BY MOUTH EVERY DAY  . timolol (TIMOPTIC) 0.5 % ophthalmic solution Place 1 drop into both eyes 2 (two) times daily.     Allergies  Allergen Reactions  . Lisinopril Cough    Social History    Socioeconomic History  . Marital status: Married    Spouse name: Not on file  . Number of children: Not on file  . Years of education: Not on file  . Highest education level: Not on file  Occupational History  . Not on file  Tobacco Use  . Smoking status: Former Smoker    Types: Cigarettes    Quit date: 03/19/2007    Years since quitting: 13.3  . Smokeless tobacco: Never Used  Vaping Use  . Vaping Use: Never used  Substance and Sexual Activity  . Alcohol use: Yes    Alcohol/week: 12.0 - 14.0 standard drinks    Types: 12 - 14 Cans of beer per week  . Drug use: No  . Sexual activity: Yes  Other Topics Concern  . Not on file  Social History Narrative  . Not on file   Social Determinants of Health   Financial Resource Strain: Low Risk   . Difficulty of Paying Living Expenses: Not very hard  Food Insecurity: Not on file  Transportation Needs: Not on file  Physical Activity: Not on file  Stress: Not on file  Social Connections: Not on file  Intimate Partner Violence: Not on file     Review of Systems: General: negative for chills, fever, night sweats or weight changes.  Cardiovascular: negative for chest pain, dyspnea on exertion, edema,  orthopnea, palpitations, paroxysmal nocturnal dyspnea or shortness of breath Dermatological: negative for rash Respiratory: negative for cough or wheezing Urologic: negative for hematuria Abdominal: negative for nausea, vomiting, diarrhea, bright red blood per rectum, melena, or hematemesis Neurologic: negative for visual changes, syncope, or dizziness All other systems reviewed and are otherwise negative except as noted above.    Blood pressure (!) 142/86, pulse (!) 58, height 5\' 10"  (1.778 m), weight 181 lb (82.1 kg).  General appearance: alert and no distress Neck: no adenopathy, no carotid bruit, no JVD, supple, symmetrical, trachea midline and thyroid not enlarged, symmetric, no tenderness/mass/nodules Lungs: clear to  auscultation bilaterally Heart: regular rate and rhythm, S1, S2 normal, no murmur, click, rub or gallop Extremities: extremities normal, atraumatic, no cyanosis or edema Pulses: 2+ and symmetric Skin: Skin color, texture, turgor normal. No rashes or lesions Neurologic: Alert and oriented X 3, normal strength and tone. Normal symmetric reflexes. Normal coordination and gait  EKG sinus bradycardia at 58 with nonspecific ST and T wave changes.  I personally reviewed this EKG.  ASSESSMENT AND PLAN:   CAD (coronary artery disease) History of CAD status post myocardial infarction, LAD stenting in [place] back in 2010 with a subsequent negative stress echo in [place] after that.  He has been completely asymptomatic.  Hyperlipidemia History of hyperlipidemia on high-dose atorvastatin followed by his PCP.  His most recent lab work performed by his PCP 12/16/2019 revealed total cholesterol 174, LDL of 84 and HDL of 67.  Essential hypertension History of essential hypertension blood pressure measured today 142/86.  He is on carvedilol and losartan.      Lorretta Harp MD FACP,FACC,FAHA, [facility] 07/26/2020 5:00 PM

## 2020-07-12 NOTE — Assessment & Plan Note (Signed)
History of CAD status post myocardial infarction, LAD stenting in [place] back in 2010 with a subsequent negative stress echo in [place] after that.  He has been completely asymptomatic.

## 2020-07-13 ENCOUNTER — Telehealth: Payer: Self-pay | Admitting: Pharmacist

## 2020-07-13 NOTE — Progress Notes (Addendum)
    Chronic Care Management Pharmacy Assistant   Name: Adley Mazurowski  MRN: [id number] DOB: 03-Aug-1950  Reason for Encounter: Adherence Review   Medications: Outpatient Encounter Medications as of 07/13/2020  Medication Sig   atorvastatin (LIPITOR) 80 MG tablet Take 1 tablet (80 mg total) by mouth daily.   carvedilol (COREG) 6.25 MG tablet TAKE 1 TABLET BY MOUTH TWICE A DAY   clopidogrel (PLAVIX) 75 MG tablet Take 1 tablet (75 mg total) by mouth daily. Please request future refills from PCP.   latanoprost (XALATAN) 0.005 % ophthalmic solution Place 1 drop into both eyes at bedtime.   losartan (COZAAR) 25 MG tablet TAKE 2 TABLETS BY MOUTH EVERY DAY   timolol (TIMOPTIC) 0.5 % ophthalmic solution Place 1 drop into both eyes 2 (two) times daily.   No facility-administered encounter medications on file as of 07/13/2020.   Reviewing the chart for any medical/health and/or medication changes there was not any changes at this time.   Follow-Up:Pharmacist Review  Charlann Lange, [place] Pharmacist Assistant [phone]

## 2020-08-02 DIAGNOSIS — M9901 Segmental and somatic dysfunction of cervical region: Secondary | ICD-10-CM | POA: Diagnosis not present

## 2020-08-02 DIAGNOSIS — M9902 Segmental and somatic dysfunction of thoracic region: Secondary | ICD-10-CM | POA: Diagnosis not present

## 2020-08-02 DIAGNOSIS — G43001 Migraine without aura, not intractable, with status migrainosus: Secondary | ICD-10-CM | POA: Diagnosis not present

## 2020-08-02 DIAGNOSIS — M5033 Other cervical disc degeneration, cervicothoracic region: Secondary | ICD-10-CM | POA: Diagnosis not present

## 2020-08-09 ENCOUNTER — Telehealth: Payer: Self-pay | Admitting: *Deleted

## 2020-08-09 DIAGNOSIS — R972 Elevated prostate specific antigen [PSA]: Secondary | ICD-10-CM

## 2020-08-09 NOTE — Telephone Encounter (Signed)
Received call from patient.   Reports that he has been out of the state for a few months and would like to proceed with urology referral at this time for elevated PSA.   Referral orders placed.

## 2020-08-10 NOTE — Telephone Encounter (Signed)
sure

## 2020-08-16 NOTE — Progress Notes (Signed)
Subjective:   Todd Patterson is a 70 y.o. male who presents for Medicare Annual/Subsequent preventive examination.   I connected with Todd Patterson today by telephone and verified that I am speaking with the correct person using two identifiers. Location patient: home Location provider: work Persons participating in the virtual visit: patient, provider.   I discussed the limitations, risks, security and privacy concerns of performing an evaluation and management service by telephone and the availability of in person appointments. I also discussed with the patient that there may be a patient responsible charge related to this service. The patient expressed understanding and verbally consented to this telephonic visit.    Interactive audio and video telecommunications were attempted between this provider and patient, however failed, due to patient having technical difficulties OR patient did not have access to video capability.  We continued and completed visit with audio only.     Review of Systems    N/A  Cardiac Risk Factors include: advanced age (>60men, >60 women);hypertension;male gender;dyslipidemia     Objective:    Today's Vitals   There is no height or weight on file to calculate BMI.  Advanced Directives 08/17/2020 07/04/2016 07/02/2016 06/17/2016 04/24/2016  Does Patient Have a Medical Advance Directive? No No No No No  Would patient like information on creating a medical advance directive? No - Patient declined No - Patient declined - - -    Current Medications (verified) Outpatient Encounter Medications as of 08/17/2020  Medication Sig  . atorvastatin (LIPITOR) 80 MG tablet Take 1 tablet (80 mg total) by mouth daily.  . carvedilol (COREG) 6.25 MG tablet TAKE 1 TABLET BY MOUTH TWICE A DAY  . clopidogrel (PLAVIX) 75 MG tablet Take 1 tablet (75 mg total) by mouth daily. Please request future refills from PCP.  Marland Kitchen ibuprofen (ADVIL) 200 MG tablet Take 200 mg by mouth every 6 (six)  hours as needed.  . latanoprost (XALATAN) 0.005 % ophthalmic solution Place 1 drop into both eyes at bedtime.  Marland Kitchen losartan (COZAAR) 25 MG tablet TAKE 2 TABLETS BY MOUTH EVERY DAY  . timolol (TIMOPTIC) 0.5 % ophthalmic solution Place 1 drop into both eyes 2 (two) times daily.   No facility-administered encounter medications on file as of 08/17/2020.    Allergies (verified) Lisinopril   History: Past Medical History:  Diagnosis Date  . Colon polyps    colonoscopy 2018, recommended repeat in 1 year due to numbe rof polyps.   . Hyperlipidemia   . Hypertension   . Hyperthyroidism    hyperparthyroid  . Myocardial infarction [facility]) 2009   Has 1 stent   Past Surgical History:  Procedure Laterality Date  . APPENDECTOMY    . CORONARY ANGIOPLASTY WITH STENT PLACEMENT  2009   1 stent  . PARATHYROIDECTOMY N/A 07/04/2016   Procedure: PARATHYROIDECTOMY;  Surgeon: Jackolyn Confer, MD;  Location: [facility];  Service: General;  Laterality: N/A;  . TONSILLECTOMY     Family History  Problem Relation Age of Onset  . Dementia Mother   . Heart disease Father   . Cancer Sister   . Breast cancer Sister    Social History   Socioeconomic History  . Marital status: Married    Spouse name: Not on file  . Number of children: Not on file  . Years of education: Not on file  . Highest education level: Not on file  Occupational History  . Not on file  Tobacco Use  . Smoking status: Former Smoker  Types: Cigarettes    Quit date: 03/19/2007    Years since quitting: 13.4  . Smokeless tobacco: Never Used  Vaping Use  . Vaping Use: Never used  Substance and Sexual Activity  . Alcohol use: Yes    Alcohol/week: 12.0 - 14.0 standard drinks    Types: 12 - 14 Cans of beer per week  . Drug use: No  . Sexual activity: Yes  Other Topics Concern  . Not on file  Social History Narrative  . Not on file   Social Determinants of Health   Financial Resource Strain: Low Risk   . Difficulty of Paying Living  Expenses: Not hard at all  Food Insecurity: No Food Insecurity  . Worried About Charity fundraiser in the Last Year: Never true  . Ran Out of Food in the Last Year: Never true  Transportation Needs: No Transportation Needs  . Lack of Transportation (Medical): No  . Lack of Transportation (Non-Medical): No  Physical Activity: Inactive  . Days of Exercise per Week: 0 days  . Minutes of Exercise per Session: 0 min  Stress: No Stress Concern Present  . Feeling of Stress : Not at all  Social Connections: Moderately Integrated  . Frequency of Communication with Friends and Family: More than three times a week  . Frequency of Social Gatherings with Friends and Family: More than three times a week  . Attends Religious Services: Never  . Active Member of Clubs or Organizations: Yes  . Attends Archivist Meetings: More than 4 times per year  . Marital Status: Married    Tobacco Counseling Counseling given: Not Answered   Clinical Intake:  Pre-visit preparation completed: Yes  Pain : No/denies pain     Nutritional Risks: None Diabetes: No  How often do you need to have someone help you when you read instructions, pamphlets, or other written materials from your doctor or pharmacy?: 1 - Never  Diabetic?No  Interpreter Needed?: No  Information entered by :: [place] of Daily Living In your present state of health, do you have any difficulty performing the following activities: 08/17/2020  Hearing? N  Vision? N  Difficulty concentrating or making decisions? N  Walking or climbing stairs? N  Dressing or bathing? N  Doing errands, shopping? N  Preparing Food and eating ? N  Using the Toilet? N  In the past six months, have you accidently leaked urine? N  Do you have problems with loss of bowel control? N  Managing your Medications? N  Managing your Finances? N  Housekeeping or managing your Housekeeping? N  Some recent data might be hidden     Patient Care Team: [name], MD as PCP - General (Family Medicine) Danice Goltz, MD as Consulting Physician (Ophthalmology) Edythe Clarity, [facility] as Pharmacist (Pharmacist)  Indicate any recent Medical Services you may have received from other than [facility] providers in the past year (date may be approximate).     Assessment:   This is a routine wellness examination for Todd Patterson.  Hearing/Vision screen  Hearing Screening   125Hz  250Hz  500Hz  1000Hz  2000Hz  3000Hz  4000Hz  6000Hz  8000Hz   Right ear:           Left ear:           Vision Screening Comments: Patient states gets eyes examined once per year.   Dietary issues and exercise activities discussed: Current Exercise Habits: The patient has a physically strenuous job, but has no regular  exercise apart from work., Exercise limited by: None identified  Goals Addressed            This Visit's Progress   . Weight (lb) < 175 lb (79.4 kg)        Depression Screen PHQ 2/9 Scores 08/17/2020 12/01/2017 12/31/2016  PHQ - 2 Score 0 0 0    Fall Risk Fall Risk  08/17/2020 12/01/2017 12/31/2016  Falls in the past year? 0 No No  Number falls in past yr: 0 - -  Injury with Fall? 0 - -  Risk for fall due to : No Fall Risks - -  Follow up Falls evaluation completed;Falls prevention discussed - -    FALL RISK PREVENTION PERTAINING TO THE HOME:  Any stairs in or around the home? Yes  If so, are there any without handrails? No  Home free of loose throw rugs in walkways, pet beds, electrical cords, etc? Yes  Adequate lighting in your home to reduce risk of falls? Yes   ASSISTIVE DEVICES UTILIZED TO PREVENT FALLS:  Life alert? No  Use of a cane, walker or w/c? No  Grab bars in the bathroom? No  Shower chair or bench in shower? No  Elevated toilet seat or a handicapped toilet? Yes    Cognitive Function:   Normal cognitive status assessed by direct observation by this Nurse Health Advisor. No abnormalities found.         Immunizations Immunization History  Administered Date(s) Administered  . Fluad Quad(high Dose 65+) 11/18/2018, 12/16/2019  . Influenza,inj,Quad PF,6+ Mos 02/06/2016, 12/31/2016, 12/01/2017  . PFIZER(Purple Top)SARS-COV-2 Vaccination 04/23/2019, 05/14/2019  . Pneumococcal Conjugate-13 01/16/2017  . Pneumococcal Polysaccharide-23 02/06/2016    TDAP status: Due, Education has been provided regarding the importance of this vaccine. Advised may receive this vaccine at local pharmacy or Health Dept. Aware to provide a copy of the vaccination record if obtained from local pharmacy or Health Dept. Verbalized acceptance and understanding.  Flu Vaccine status: Up to date  Pneumococcal vaccine status: Up to date  Covid-19 vaccine status: Completed vaccines  Qualifies for Shingles Vaccine? Yes   Zostavax completed No   Shingrix Completed?: No.    Education has been provided regarding the importance of this vaccine. Patient has been advised to call insurance company to determine out of pocket expense if they have not yet received this vaccine. Advised may also receive vaccine at local pharmacy or Health Dept. Verbalized acceptance and understanding.  Screening Tests Health Maintenance  Topic Date Due  . TETANUS/TDAP  Never done  . Zoster Vaccines- Shingrix (1 of 2) Never done  . COVID-19 Vaccine (3 - Pfizer risk 4-dose series) 06/11/2019  . COLONOSCOPY (Pts 45-44yrs Insurance coverage will need to be confirmed)  01/30/2020  . INFLUENZA VACCINE  10/16/2020  . Hepatitis C Screening  Completed  . PNA vac Low Risk Adult  Completed  . HPV VACCINES  Aged Out    Health Maintenance  Health Maintenance Due  Topic Date Due  . TETANUS/TDAP  Never done  . Zoster Vaccines- Shingrix (1 of 2) Never done  . COVID-19 Vaccine (3 - Pfizer risk 4-dose series) 06/11/2019  . COLONOSCOPY (Pts 45-6yrs Insurance coverage will need to be confirmed)  01/30/2020    Colorectal cancer screening:  Referral to GI placed 08/17/2020. Pt aware the office will call re: appt.  Lung Cancer Screening: (Low Dose CT Chest recommended if Age 29-80 years, 30 pack-year currently smoking OR have quit w/in 15years.) does not qualify.  Lung Cancer Screening Referral: N/A   Additional Screening:  Hepatitis C Screening: does qualify; Completed 12/31/2016  Vision Screening: Recommended annual ophthalmology exams for early detection of glaucoma and other disorders of the eye. Is the patient up to date with their annual eye exam?  Yes  Who is the provider or what is the name of the office in which the patient attends annual eye exams? [facility] If pt is not established with a provider, would they like to be referred to a provider to establish care? No .   Dental Screening: Recommended annual dental exams for proper oral hygiene  Community Resource Referral / Chronic Care Management: CRR required this visit?  No   CCM required this visit?  No      Plan:     I have personally reviewed and noted the following in the patient's chart:   . Medical and social history . Use of alcohol, tobacco or illicit drugs  . Current medications and supplements including opioid prescriptions. Patient is not currently taking opioid prescriptions. . Functional ability and status . Nutritional status . Physical activity . Advanced directives . List of other physicians . Hospitalizations, surgeries, and ER visits in previous 12 months . Vitals . Screenings to include cognitive, depression, and falls . Referrals and appointments  In addition, I have reviewed and discussed with patient certain preventive protocols, quality metrics, and best practice recommendations. A written personalized care plan for preventive services as well as general preventive health recommendations were provided to patient.     Ofilia Neas, LPN   6/0/1093   Nurse Notes: None

## 2020-08-17 ENCOUNTER — Other Ambulatory Visit: Payer: Self-pay

## 2020-08-17 ENCOUNTER — Ambulatory Visit (INDEPENDENT_AMBULATORY_CARE_PROVIDER_SITE_OTHER): Payer: Medicare HMO

## 2020-08-17 DIAGNOSIS — Z0000 Encounter for general adult medical examination without abnormal findings: Secondary | ICD-10-CM | POA: Diagnosis not present

## 2020-08-17 NOTE — Patient Instructions (Signed)
Todd Patterson , Thank you for taking time to come for your Medicare Wellness Visit. I appreciate your ongoing commitment to your health goals. Please review the following plan we discussed and let me know if I can assist you in the future.   Screening recommendations/referrals: Colonoscopy: Currently due, please contact [facility] so that you may get scheduled  Recommended yearly ophthalmology/optometry visit for glaucoma screening and checkup Recommended yearly dental visit for hygiene and checkup  Vaccinations: Influenza vaccine: Up to date, next due fall 2022 Pneumococcal vaccine: Completed series  Tdap vaccine: Currently due, you may await and injury to receive  Shingles vaccine: Currently due for Shingrix, if you would like to receive we recommend that you do so at your local pharmacy.    Advanced directives: Advance directive discussed with you today. Even though you declined this today please call our office should you change your mind and we can give you the proper paperwork for you to fill out.   Conditions/risks identified: None   Next appointment: None   Preventive Care 65 Years and Older, Male Preventive care refers to lifestyle choices and visits with your health care provider that can promote health and wellness. What does preventive care include?  A yearly physical exam. This is also called an annual well check.  Dental exams once or twice a year.  Routine eye exams. Ask your health care provider how often you should have your eyes checked.  Personal lifestyle choices, including:  Daily care of your teeth and gums.  Regular physical activity.  Eating a healthy diet.  Avoiding tobacco and drug use.  Limiting alcohol use.  Practicing safe sex.  Taking low doses of aspirin every day.  Taking vitamin and mineral supplements as recommended by your health care provider. What happens during an annual well check? The services and screenings done by your  health care provider during your annual well check will depend on your age, overall health, lifestyle risk factors, and family history of disease. Counseling  Your health care provider may ask you questions about your:  Alcohol use.  Tobacco use.  Drug use.  Emotional well-being.  Home and relationship well-being.  Sexual activity.  Eating habits.  History of falls.  Memory and ability to understand (cognition).  Work and work Statistician. Screening  You may have the following tests or measurements:  Height, weight, and BMI.  Blood pressure.  Lipid and cholesterol levels. These may be checked every 5 years, or more frequently if you are over 67 years old.  Skin check.  Lung cancer screening. You may have this screening every year starting at age 48 if you have a 30-pack-year history of smoking and currently smoke or have quit within the past 15 years.  Fecal occult blood test (FOBT) of the stool. You may have this test every year starting at age 70.  Flexible sigmoidoscopy or colonoscopy. You may have a sigmoidoscopy every 5 years or a colonoscopy every 10 years starting at age 66.  Prostate cancer screening. Recommendations will vary depending on your family history and other risks.  Hepatitis C blood test.  Hepatitis B blood test.  Sexually transmitted disease (STD) testing.  Diabetes screening. This is done by checking your blood sugar (glucose) after you have not eaten for a while (fasting). You may have this done every 1-3 years.  Abdominal aortic aneurysm (AAA) screening. You may need this if you are a current or former smoker.  Osteoporosis. You may be screened starting at  age 48 if you are at high risk. Talk with your health care provider about your test results, treatment options, and if necessary, the need for more tests. Vaccines  Your health care provider may recommend certain vaccines, such as:  Influenza vaccine. This is recommended every  year.  Tetanus, diphtheria, and acellular pertussis (Tdap, Td) vaccine. You may need a Td booster every 10 years.  Zoster vaccine. You may need this after age 70.  Pneumococcal 13-valent conjugate (PCV13) vaccine. One dose is recommended after age 26.  Pneumococcal polysaccharide (PPSV23) vaccine. One dose is recommended after age 55. Talk to your health care provider about which screenings and vaccines you need and how often you need them. This information is not intended to replace advice given to you by your health care provider. Make sure you discuss any questions you have with your health care provider. Document Released: 03/31/2015 Document Revised: 11/22/2015 Document Reviewed: 01/03/2015 Elsevier Interactive Patient Education  2017 [place] Prevention in the Home Falls can cause injuries. They can happen to people of all ages. There are many things you can do to make your home safe and to help prevent falls. What can I do on the outside of my home?  Regularly fix the edges of walkways and driveways and fix any cracks.  Remove anything that might make you trip as you walk through a door, such as a raised step or threshold.  Trim any bushes or trees on the path to your home.  Use bright outdoor lighting.  Clear any walking paths of anything that might make someone trip, such as rocks or tools.  Regularly check to see if handrails are loose or broken. Make sure that both sides of any steps have handrails.  Any raised decks and porches should have guardrails on the edges.  Have any leaves, snow, or ice cleared regularly.  Use sand or salt on walking paths during winter.  Clean up any spills in your garage right away. This includes oil or grease spills. What can I do in the bathroom?  Use night lights.  Install grab bars by the toilet and in the tub and shower. Do not use towel bars as grab bars.  Use non-skid mats or decals in the tub or shower.  If you  need to sit down in the shower, use a plastic, non-slip stool.  Keep the floor dry. Clean up any water that spills on the floor as soon as it happens.  Remove soap buildup in the tub or shower regularly.  Attach bath mats securely with double-sided non-slip rug tape.  Do not have throw rugs and other things on the floor that can make you trip. What can I do in the bedroom?  Use night lights.  Make sure that you have a light by your bed that is easy to reach.  Do not use any sheets or blankets that are too big for your bed. They should not hang down onto the floor.  Have a firm chair that has side arms. You can use this for support while you get dressed.  Do not have throw rugs and other things on the floor that can make you trip. What can I do in the kitchen?  Clean up any spills right away.  Avoid walking on wet floors.  Keep items that you use a lot in easy-to-reach places.  If you need to reach something above you, use a strong step stool that has a grab bar.  Keep electrical cords out of the way.  Do not use floor polish or wax that makes floors slippery. If you must use wax, use non-skid floor wax.  Do not have throw rugs and other things on the floor that can make you trip. What can I do with my stairs?  Do not leave any items on the stairs.  Make sure that there are handrails on both sides of the stairs and use them. Fix handrails that are broken or loose. Make sure that handrails are as long as the stairways.  Check any carpeting to make sure that it is firmly attached to the stairs. Fix any carpet that is loose or worn.  Avoid having throw rugs at the top or bottom of the stairs. If you do have throw rugs, attach them to the floor with carpet tape.  Make sure that you have a light switch at the top of the stairs and the bottom of the stairs. If you do not have them, ask someone to add them for you. What else can I do to help prevent falls?  Wear shoes  that:  Do not have high heels.  Have rubber bottoms.  Are comfortable and fit you well.  Are closed at the toe. Do not wear sandals.  If you use a stepladder:  Make sure that it is fully opened. Do not climb a closed stepladder.  Make sure that both sides of the stepladder are locked into place.  Ask someone to hold it for you, if possible.  Clearly mark and make sure that you can see:  Any grab bars or handrails.  First and last steps.  Where the edge of each step is.  Use tools that help you move around (mobility aids) if they are needed. These include:  Canes.  Walkers.  Scooters.  Crutches.  Turn on the lights when you go into a dark area. Replace any light bulbs as soon as they burn out.  Set up your furniture so you have a clear path. Avoid moving your furniture around.  If any of your floors are uneven, fix them.  If there are any pets around you, be aware of where they are.  Review your medicines with your doctor. Some medicines can make you feel dizzy. This can increase your chance of falling. Ask your doctor what other things that you can do to help prevent falls. This information is not intended to replace advice given to you by your health care provider. Make sure you discuss any questions you have with your health care provider. Document Released: 12/29/2008 Document Revised: 08/10/2015 Document Reviewed: 04/08/2014 Elsevier Interactive Patient Education  2017 [employer].

## 2020-08-29 DIAGNOSIS — G43001 Migraine without aura, not intractable, with status migrainosus: Secondary | ICD-10-CM | POA: Diagnosis not present

## 2020-08-29 DIAGNOSIS — M5033 Other cervical disc degeneration, cervicothoracic region: Secondary | ICD-10-CM | POA: Diagnosis not present

## 2020-08-29 DIAGNOSIS — M9902 Segmental and somatic dysfunction of thoracic region: Secondary | ICD-10-CM | POA: Diagnosis not present

## 2020-08-29 DIAGNOSIS — M9901 Segmental and somatic dysfunction of cervical region: Secondary | ICD-10-CM | POA: Diagnosis not present

## 2020-09-13 DIAGNOSIS — R972 Elevated prostate specific antigen [PSA]: Secondary | ICD-10-CM | POA: Diagnosis not present

## 2020-09-13 DIAGNOSIS — C61 Malignant neoplasm of prostate: Secondary | ICD-10-CM | POA: Diagnosis not present

## 2020-09-21 ENCOUNTER — Other Ambulatory Visit: Payer: Self-pay | Admitting: Family Medicine

## 2020-09-26 DIAGNOSIS — M9902 Segmental and somatic dysfunction of thoracic region: Secondary | ICD-10-CM | POA: Diagnosis not present

## 2020-09-26 DIAGNOSIS — M5033 Other cervical disc degeneration, cervicothoracic region: Secondary | ICD-10-CM | POA: Diagnosis not present

## 2020-09-26 DIAGNOSIS — M9901 Segmental and somatic dysfunction of cervical region: Secondary | ICD-10-CM | POA: Diagnosis not present

## 2020-09-26 DIAGNOSIS — G43001 Migraine without aura, not intractable, with status migrainosus: Secondary | ICD-10-CM | POA: Diagnosis not present

## 2020-09-28 DIAGNOSIS — R351 Nocturia: Secondary | ICD-10-CM | POA: Diagnosis not present

## 2020-09-28 DIAGNOSIS — R350 Frequency of micturition: Secondary | ICD-10-CM | POA: Diagnosis not present

## 2020-09-28 DIAGNOSIS — C61 Malignant neoplasm of prostate: Secondary | ICD-10-CM | POA: Diagnosis not present

## 2020-10-10 NOTE — Progress Notes (Signed)
GU Location of Tumor / Histology: Adenocarcinoma of the prostate  If Prostate Cancer, Gleason Score is (3 + 3), PSA (8.82), and Prostate volume (30.4 grams)  Todd Patterson presented  months ago with signs/symptoms of: elevated PSA levels and some lower urinary tract symptoms: incomplete emptying of bladder, urinary frequency, and nocturia  Biopsies revealed:  09/13/2020   Past/Anticipated interventions by urology, if any:  09/28/2020 Dr. Rexene Alberts   09/13/2020 Dr. Rexene Alberts Transrectal ultrasound of prostate with biopsies  Past/Anticipated interventions by medical oncology, if any:  No referral placed at this time  Weight changes, if any: Patient denies  IPSS Score: 5 (mild) SHIM Score: 15 (mild-moderate ED)  Bowel/Bladder complaints, if any: Patient denies any issues with diarrhea or constipation, or changes in his urinary habits. He reports he would be mostly satisfied is he had to live with his current urinary condition for the rest of his life  Nausea/Vomiting, if any: Patient denies  Pain issues, if any:  Other than chronic knee pain, patient denies and issues/concerns  SAFETY ISSUES: Prior radiation? No Pacemaker/ICD? No Possible current pregnancy? N/A Is the patient on methotrexate? No  Current Complaints / other details:  Patient has received the first 2 Pfizer vaccines.

## 2020-10-12 ENCOUNTER — Ambulatory Visit
Admission: RE | Admit: 2020-10-12 | Discharge: 2020-10-12 | Disposition: A | Discharge status: Home / Self Care | Payer: Medicare HMO | Source: Ambulatory Visit | Attending: Radiation Oncology | Admitting: Radiation Oncology

## 2020-10-12 ENCOUNTER — Other Ambulatory Visit: Payer: Self-pay

## 2020-10-12 DIAGNOSIS — C61 Malignant neoplasm of prostate: Secondary | ICD-10-CM

## 2020-10-12 DIAGNOSIS — R972 Elevated prostate specific antigen [PSA]: Secondary | ICD-10-CM | POA: Diagnosis not present

## 2020-10-12 NOTE — Progress Notes (Signed)
Radiation Oncology         [phone] ________________________________  Initial Outpatient Consultation  Name: Todd Patterson MRN: [id number]  Date: 10/12/2020  DOB: 1950-11-15  [id number], Todd Mcgee, MD  Todd Lima, MD   REFERRING PHYSICIAN: Janith Lima, MD  DIAGNOSIS: 70 y.o. gentleman with Stage T1c adenocarcinoma of the prostate with Gleason score of 3+3, and PSA of 8.82.    ICD-10-CM   1. Malignant neoplasm of prostate (Todd Patterson)  C61       HISTORY OF PRESENT ILLNESS: Todd Patterson is a 70 y.o. male with a diagnosis of prostate cancer. He was noted to have an elevated PSA of 5.98 in 12/2019, by his primary care physician, Dr. Dennard Patterson.  Accordingly, he was referred for evaluation in urology by Dr. Abner Patterson on 02/04/20,  digital rectal examination was performed at that time revealing no nodules or concerning findings.  A repeat PSA on 09/13/2020 was further elevated at 8.82.  The patient proceeded to transrectal ultrasound with 12 biopsies of the prostate on 09/13/2020.  The prostate volume measured 30.4 cc.  Out of 12 core biopsies, 9 were positive.  The maximum Gleason score was 3+3, and this was seen in the left base, left base lateral, right base, all on the left mid lateral, right mid, right mid lateral, right apex and right apex lateral.  The patient reviewed the biopsy results with his urologist and he has kindly been referred today for discussion of potential radiation treatment options. His daughter, Todd Patterson, accompanies him for today's visit.   PREVIOUS RADIATION THERAPY: No  PAST MEDICAL HISTORY:  Past Medical History:  Diagnosis Date   Colon polyps    colonoscopy 2018, recommended repeat in 1 year due to numbe rof polyps.    Hyperlipidemia    Hypertension    Hyperthyroidism    hyperparthyroid   Myocardial infarction [facility]) 2009   Has 1 stent      PAST SURGICAL HISTORY: Past Surgical History:  Procedure Laterality Date   APPENDECTOMY     CORONARY ANGIOPLASTY WITH STENT  PLACEMENT  2009   1 stent   PARATHYROIDECTOMY N/A 07/04/2016   Procedure: PARATHYROIDECTOMY;  Surgeon: Todd Confer, MD;  Location: [facility];  Service: General;  Laterality: N/A;   TONSILLECTOMY      FAMILY HISTORY:  Family History  Problem Relation Age of Onset   Dementia Mother    Heart disease Father    Cancer Sister    Breast cancer Sister     SOCIAL HISTORY:  Social History   Socioeconomic History   Marital status: Married    Spouse name: Not on file   Number of children: Not on file   Years of education: Not on file   Highest education level: Not on file  Occupational History   Not on file  Tobacco Use   Smoking status: Former    Types: Cigarettes    Quit date: 03/19/2007    Years since quitting: 13.5   Smokeless tobacco: Never  Vaping Use   Vaping Use: Never used  Substance and Sexual Activity   Alcohol use: Yes    Alcohol/week: 12.0 - 14.0 standard drinks    Types: 12 - 14 Cans of beer per week   Drug use: No   Sexual activity: Yes  Other Topics Concern   Not on file  Social History Narrative   Not on file   Social Determinants of Health   Financial Resource Strain: Low Risk    Difficulty of  Paying Living Expenses: Not hard at all  Food Insecurity: No Food Insecurity   Worried About [place] in the Last Year: Never true   Ran Out of Food in the Last Year: Never true  Transportation Needs: No Transportation Needs   Lack of Transportation (Medical): No   Lack of Transportation (Non-Medical): No  Physical Activity: Inactive   Days of Exercise per Week: 0 days   Minutes of Exercise per Session: 0 min  Stress: No Stress Concern Present   Feeling of Stress : Not at all  Social Connections: Moderately Integrated   Frequency of Communication with Friends and Family: More than three times a week   Frequency of Social Gatherings with Friends and Family: More than three times a week   Attends Religious Services: Never   Marine scientist or  Organizations: Yes   Attends Music therapist: More than 4 times per year   Marital Status: Married  Human resources officer Violence: Not At Risk   Fear of Current or Ex-Partner: No   Emotionally Abused: No   Physically Abused: No   Sexually Abused: No    ALLERGIES: Lisinopril  MEDICATIONS:  Current Outpatient Medications  Medication Sig Dispense Refill   atorvastatin (LIPITOR) 80 MG tablet Take 1 tablet (80 mg total) by mouth daily. 90 tablet 1   carvedilol (COREG) 6.25 MG tablet TAKE 1 TABLET BY MOUTH TWICE A DAY 180 tablet 1   clopidogrel (PLAVIX) 75 MG tablet Take 1 tablet (75 mg total) by mouth daily. Please request future refills from PCP. 90 tablet 1   ibuprofen (ADVIL) 200 MG tablet Take 200 mg by mouth every 6 (six) hours as needed.     latanoprost (XALATAN) 0.005 % ophthalmic solution Place 1 drop into both eyes at bedtime.     losartan (COZAAR) 25 MG tablet TAKE 2 TABLETS BY MOUTH EVERY DAY 180 tablet 3   timolol (TIMOPTIC) 0.5 % ophthalmic solution Place 1 drop into both eyes 2 (two) times daily.     No current facility-administered medications for this encounter.    REVIEW OF SYSTEMS:  On review of systems, the patient reports that he is doing well overall. He denies any chest pain, shortness of breath, cough, fevers, chills, night sweats, unintended weight changes. He denies any bowel disturbances, and denies abdominal pain, nausea or vomiting. He denies any new musculoskeletal or joint aches or pains. His IPSS was 5, indicating mild urinary symptoms with nocturia x3 and urinary frequency. His SHIM was 15, indicating he has mild-moderate erectile dysfunction. A complete review of systems is obtained and is otherwise negative.  PHYSICAL EXAM:  Wt Readings from Last 3 Encounters:  07/12/20 181 lb (82.1 kg)  12/16/19 218 lb (98.9 kg)  11/18/19 217 lb (98.4 kg)   Temp Readings from Last 3 Encounters:  04/01/20 98.4 F (36.9 C) (Oral)  12/16/19 97.7 F (36.5 C)  (Oral)  11/18/19 (!) 96.1 F (35.6 C) (Temporal)   BP Readings from Last 3 Encounters:  07/12/20 (!) 142/86  04/01/20 (!) 159/77  12/16/19 140/90   Pulse Readings from Last 3 Encounters:  07/12/20 (!) 58  04/01/20 67  12/16/19 64    /10  In general this is a well appearing Caucasian male in no acute distress. He's alert and oriented x4 and appropriate throughout the examination. Cardiopulmonary assessment is negative for acute distress, and he exhibits normal effort.     KPS = 100  100 - Normal; no complaints;  no evidence of disease. 90   - Able to carry on normal activity; minor signs or symptoms of disease. 80   - Normal activity with effort; some signs or symptoms of disease. 24   - Cares for self; unable to carry on normal activity or to do active work. 60   - Requires occasional assistance, but is able to care for most of his personal needs. 50   - Requires considerable assistance and frequent medical care. 58   - Disabled; requires special care and assistance. 68   - Severely disabled; hospital admission is indicated although death not imminent. 45   - Very sick; hospital admission necessary; active supportive treatment necessary. 10   - Moribund; fatal processes progressing rapidly. 0     - Dead  Karnofsky DA, Abelmann Huntley, Craver LS and Burchenal [facility] [phone]) The use of the nitrogen mustards in the palliative treatment of carcinoma: with particular reference to bronchogenic carcinoma Cancer 1 634-56  LABORATORY DATA:  Lab Results  Component Value Date   WBC 11.3 (H) 12/16/2019   HGB 15.4 12/16/2019   HCT 45.2 12/16/2019   MCV 91.1 12/16/2019   PLT 276 12/16/2019   Lab Results  Component Value Date   NA 141 12/16/2019   K 4.7 12/16/2019   CL 104 12/16/2019   CO2 27 12/16/2019   Lab Results  Component Value Date   ALT 23 12/16/2019   AST 16 12/16/2019   ALKPHOS 70 07/08/2019   BILITOT 0.5 12/16/2019     RADIOGRAPHY: No results found.     IMPRESSION/PLAN: 1. 70 y.o. gentleman with Stage T1c adenocarcinoma of the prostate with Gleason Score of 3+ 3, and PSA of 8.82. We discussed the patient's workup and outlined the nature of prostate cancer in this setting. The patient's T stage, Gleason's score, and PSA put him into the low risk group. Accordingly, he is eligible for a variety of potential treatment options including active surveillance, brachytherapy, 5.5 weeks of external radiation, or prostatectomy.  Although he does have low risk, Gleason 6 disease, given the significant volume of disease, we do recommend proceeding with curative treatment.  We discussed the available radiation techniques, and focused on the details and logistics of delivery. We discussed and outlined the risks, benefits, short and long-term effects associated with radiotherapy and compared and contrasted these with prostatectomy. We discussed the role of SpaceOAR gel in reducing the rectal toxicity associated with radiotherapy. He appears to have a good understanding of his disease and our treatment recommendations which are of curative intent.  He and his daughter, Todd Patterson, were encouraged to ask questions that were answered to their stated satisfaction.  At the conclusion of our conversation, the patient is interested in moving forward with brachytherapy and use of SpaceOAR gel to reduce rectal toxicity from radiotherapy.  We will share our discussion with Dr. Abner Patterson and move forward with scheduling his [facility] planning appointment in the near future.  The patient met briefly with Romie Jumper in our office who will be working closely with him to coordinate OR scheduling and pre and post procedure appointments.  We will contact the pharmaceutical rep to ensure that [place] is available at the time of procedure.  We enjoyed meeting him today and look forward to continuing to participate in his care.  We personally spent 75 minutes in this encounter including chart review,  reviewing radiological studies, meeting face-to-face with the patient, entering orders and completing documentation.   Nicholos Johns, PA-C  Tyler Pita, MD  [facility]  Radiation Oncology Direct Dial: [phone]  Fax: [phone] [email or web address]  Skype  LinkedIn

## 2020-10-17 DIAGNOSIS — M9901 Segmental and somatic dysfunction of cervical region: Secondary | ICD-10-CM | POA: Diagnosis not present

## 2020-10-17 DIAGNOSIS — G43001 Migraine without aura, not intractable, with status migrainosus: Secondary | ICD-10-CM | POA: Diagnosis not present

## 2020-10-17 DIAGNOSIS — M5033 Other cervical disc degeneration, cervicothoracic region: Secondary | ICD-10-CM | POA: Diagnosis not present

## 2020-10-17 DIAGNOSIS — M9902 Segmental and somatic dysfunction of thoracic region: Secondary | ICD-10-CM | POA: Diagnosis not present

## 2020-10-20 ENCOUNTER — Telehealth: Payer: Self-pay | Admitting: Pharmacist

## 2020-10-20 ENCOUNTER — Telehealth: Payer: Self-pay | Admitting: *Deleted

## 2020-10-20 ENCOUNTER — Other Ambulatory Visit: Payer: Self-pay | Admitting: Urology

## 2020-10-20 DIAGNOSIS — C61 Malignant neoplasm of prostate: Secondary | ICD-10-CM

## 2020-10-20 NOTE — Progress Notes (Addendum)
    Chronic Care Management Pharmacy Assistant   Name: Todd Patterson  MRN: [id number] DOB: 09/17/1950  Reason for Encounter: General Disease State Call    Conditions to be addressed/monitored: CAD, HTN, Hyperlipidemia, CVA  Recent office visits:  None since 07/13/20  Recent consult visits:  08/02/20 Chiropractic Medicine Iris Pert. No information given.   Hospital visits:  None since 07/13/20  Medications: Outpatient Encounter Medications as of 10/20/2020  Medication Sig   atorvastatin (LIPITOR) 80 MG tablet Take 1 tablet (80 mg total) by mouth daily.   carvedilol (COREG) 6.25 MG tablet TAKE 1 TABLET BY MOUTH TWICE A DAY   clopidogrel (PLAVIX) 75 MG tablet Take 1 tablet (75 mg total) by mouth daily. Please request future refills from PCP.   ibuprofen (ADVIL) 200 MG tablet Take 200 mg by mouth every 6 (six) hours as needed.   latanoprost (XALATAN) 0.005 % ophthalmic solution Place 1 drop into both eyes at bedtime.   losartan (COZAAR) 25 MG tablet TAKE 2 TABLETS BY MOUTH EVERY DAY   timolol (TIMOPTIC) 0.5 % ophthalmic solution Place 1 drop into both eyes 2 (two) times daily.   No facility-administered encounter medications on file as of 10/20/2020.   GEN CALL: Patient stated he is doing really good.He stated he does not have any questions or concerns about his medications at this time. We discussed his upcoming appointments and informed him that he doesn't have a upcoming appointment with the Dr. Dennard Schaumann and suggested that he call when he had time to make appointment for the future.   Star Rating Drugs: Atorvastatin 80 mg 90 DS 07/12/20, Losartan 25 mg 90 DS 08/24/20.  Follow-Up:Pharmacist Review   Charlann Lange, RMA Clinical Pharmacist Assistant [phone]  5 minutes spent in review, coordination, and documentation.  Reviewed by: Beverly Milch, PharmD Clinical Pharmacist [phone]

## 2020-10-20 NOTE — Telephone Encounter (Signed)
CALLED PATIENT TO INFORM OF IMPLANT DATE SPOKE WITH PATIENT AND HE IS AWARE OF THIS DATE

## 2020-11-01 ENCOUNTER — Other Ambulatory Visit: Payer: Self-pay | Admitting: Family Medicine

## 2020-11-02 DIAGNOSIS — C61 Malignant neoplasm of prostate: Secondary | ICD-10-CM | POA: Diagnosis not present

## 2020-11-08 DIAGNOSIS — M5033 Other cervical disc degeneration, cervicothoracic region: Secondary | ICD-10-CM | POA: Diagnosis not present

## 2020-11-08 DIAGNOSIS — G43001 Migraine without aura, not intractable, with status migrainosus: Secondary | ICD-10-CM | POA: Diagnosis not present

## 2020-11-08 DIAGNOSIS — M9902 Segmental and somatic dysfunction of thoracic region: Secondary | ICD-10-CM | POA: Diagnosis not present

## 2020-11-08 DIAGNOSIS — M9901 Segmental and somatic dysfunction of cervical region: Secondary | ICD-10-CM | POA: Diagnosis not present

## 2020-11-29 DIAGNOSIS — M5033 Other cervical disc degeneration, cervicothoracic region: Secondary | ICD-10-CM | POA: Diagnosis not present

## 2020-11-29 DIAGNOSIS — G43001 Migraine without aura, not intractable, with status migrainosus: Secondary | ICD-10-CM | POA: Diagnosis not present

## 2020-11-29 DIAGNOSIS — M9901 Segmental and somatic dysfunction of cervical region: Secondary | ICD-10-CM | POA: Diagnosis not present

## 2020-11-29 DIAGNOSIS — M9902 Segmental and somatic dysfunction of thoracic region: Secondary | ICD-10-CM | POA: Diagnosis not present

## 2020-12-07 DIAGNOSIS — H401131 Primary open-angle glaucoma, bilateral, mild stage: Secondary | ICD-10-CM | POA: Diagnosis not present

## 2020-12-08 ENCOUNTER — Telehealth: Payer: Self-pay

## 2020-12-08 NOTE — Telephone Encounter (Signed)
Received call For Todd Patterson needed clarification of want he needs to do to prepare for upcoming appointment 12/14/2020.  Notified Cira Rue, RN, medical/oncology she stated she will call the patient.

## 2020-12-11 ENCOUNTER — Telehealth: Payer: Self-pay

## 2020-12-11 NOTE — Telephone Encounter (Signed)
RN spoke with patient regarding questions about CT Sim on 9/29.  All questions answered, no further needs at this time.

## 2020-12-12 ENCOUNTER — Telehealth: Payer: Self-pay | Admitting: *Deleted

## 2020-12-12 NOTE — Telephone Encounter (Signed)
RETURNED PATIENT'S PHONE CALL, SPOKE WITH PATIENT. ?

## 2020-12-14 ENCOUNTER — Ambulatory Visit
Admission: RE | Admit: 2020-12-14 | Discharge: 2020-12-14 | Disposition: A | Discharge status: Home / Self Care | Payer: Medicare HMO | Source: Ambulatory Visit | Attending: Urology | Admitting: Urology

## 2020-12-14 ENCOUNTER — Encounter (HOSPITAL_COMMUNITY)
Admission: RE | Admit: 2020-12-14 | Discharge: 2020-12-14 | Disposition: A | Discharge status: Home / Self Care | Payer: Medicare HMO | Source: Ambulatory Visit | Attending: Urology | Admitting: Urology

## 2020-12-14 ENCOUNTER — Ambulatory Visit
Admission: RE | Admit: 2020-12-14 | Discharge: 2020-12-14 | Disposition: A | Discharge status: Home / Self Care | Payer: Medicare HMO | Source: Ambulatory Visit | Attending: Radiation Oncology | Admitting: Radiation Oncology

## 2020-12-14 ENCOUNTER — Ambulatory Visit (HOSPITAL_COMMUNITY)
Admission: RE | Admit: 2020-12-14 | Discharge: 2020-12-14 | Disposition: A | Discharge status: Home / Self Care | Payer: Medicare HMO | Source: Ambulatory Visit | Attending: Urology | Admitting: Urology

## 2020-12-14 ENCOUNTER — Encounter: Payer: Self-pay | Admitting: Urology

## 2020-12-14 ENCOUNTER — Other Ambulatory Visit: Payer: Self-pay

## 2020-12-14 DIAGNOSIS — C61 Malignant neoplasm of prostate: Secondary | ICD-10-CM | POA: Insufficient documentation

## 2020-12-14 DIAGNOSIS — Z01818 Encounter for other preprocedural examination: Secondary | ICD-10-CM | POA: Diagnosis not present

## 2020-12-14 NOTE — Progress Notes (Signed)
Patient states doing well. No symptoms reported at this time.  No current urinary management medications. Urology follow is with Dr. Abner Greenspan, early November (date is to be determined).

## 2020-12-14 NOTE — Progress Notes (Signed)
  Radiation Oncology         [phone]) [phone] ________________________________  Name: Todd Patterson MRN: [id number]  Date: 12/14/2020  DOB: 10/28/1950  SIMULATION AND TREATMENT PLANNING NOTE PUBIC ARCH STUDY  JZ:[id number], Cammie Mcgee, MD  [name], MD  DIAGNOSIS: 70 y.o. gentleman with Stage T1c adenocarcinoma of the prostate with Gleason score of 3+3, and PSA of 8.82.  Oncology History  Malignant neoplasm of prostate ([place])  09/13/2020 Cancer Staging   Staging form: Prostate, AJCC 8th Edition - Clinical stage from 09/13/2020: Stage I (cT1c, cN0, cM0, PSA: 8.8, Grade Group: 1) - Signed by Freeman Caldron, PA-C on 10/12/2020 Histopathologic type: Adenocarcinoma, NOS Stage prefix: Initial diagnosis Prostate specific antigen (PSA) range: Less than 10 Gleason primary pattern: 3 Gleason secondary pattern: 3 Gleason score: 6 Histologic grading system: 5 grade system Number of biopsy cores examined: 12 Number of biopsy cores positive: 9 Location of positive needle core biopsies: Both sides   10/12/2020 Initial Diagnosis   Malignant neoplasm of prostate ([place])       ICD-10-CM   1. Malignant neoplasm of prostate ([place])  C61       COMPLEX SIMULATION:  The patient presented today for evaluation for possible prostate seed implant. He was brought to the radiation planning suite and placed supine on the CT couch. A 3-dimensional image study set was obtained in upload to the planning computer. There, on each axial slice, I contoured the prostate gland. Then, using three-dimensional radiation planning tools I reconstructed the prostate in view of the structures from the transperineal needle pathway to assess for possible pubic arch interference. In doing so, I did not appreciate any pubic arch interference. Also, the patient's prostate volume was estimated based on the drawn structure. The volume was 30 cc.  Given the pubic arch appearance and prostate volume, patient remains a good candidate to  proceed with prostate seed implant. Today, he freely provided informed written consent to proceed.    PLAN: The patient will undergo prostate seed implant.   ________________________________  Sheral Apley. Tammi Klippel, M.D.

## 2020-12-19 DIAGNOSIS — G43001 Migraine without aura, not intractable, with status migrainosus: Secondary | ICD-10-CM | POA: Diagnosis not present

## 2020-12-19 DIAGNOSIS — M5033 Other cervical disc degeneration, cervicothoracic region: Secondary | ICD-10-CM | POA: Diagnosis not present

## 2020-12-19 DIAGNOSIS — M9902 Segmental and somatic dysfunction of thoracic region: Secondary | ICD-10-CM | POA: Diagnosis not present

## 2020-12-19 DIAGNOSIS — M9901 Segmental and somatic dysfunction of cervical region: Secondary | ICD-10-CM | POA: Diagnosis not present

## 2020-12-22 DIAGNOSIS — C61 Malignant neoplasm of prostate: Secondary | ICD-10-CM | POA: Diagnosis not present

## 2020-12-22 DIAGNOSIS — Z01818 Encounter for other preprocedural examination: Secondary | ICD-10-CM | POA: Diagnosis not present

## 2020-12-27 ENCOUNTER — Telehealth: Payer: Self-pay | Admitting: *Deleted

## 2020-12-27 NOTE — Telephone Encounter (Signed)
RETURNED PATIENT'S PHONE CALL, SPOKE WITH PATIENT. ?

## 2021-01-05 ENCOUNTER — Telehealth: Payer: Self-pay | Admitting: Pharmacist

## 2021-01-05 ENCOUNTER — Encounter (HOSPITAL_BASED_OUTPATIENT_CLINIC_OR_DEPARTMENT_OTHER): Payer: Self-pay | Admitting: Urology

## 2021-01-05 ENCOUNTER — Encounter: Payer: Self-pay | Admitting: Family Medicine

## 2021-01-05 NOTE — Progress Notes (Signed)
    Chronic Care Management Pharmacy Assistant   Name: Todd Patterson  MRN: [id number] DOB: 1950/08/06  Reason for Encounter: General Disease State Call   Conditions to be addressed/monitored: CAD, HTN, Hyperlipidemia, CVA  Recent office visits:  None since 10/20/20  Recent consult visits:  11/08/20 Chiropractic Medicine Iris Pert No information given. 11/02/20 Urology [place], [place] No information Given.  Hospital visits:  None since 10/20/20  Medications: Outpatient Encounter Medications as of 01/05/2021  Medication Sig   atorvastatin (LIPITOR) 80 MG tablet TAKE 1 TABLET BY MOUTH EVERY DAY   carvedilol (COREG) 6.25 MG tablet TAKE 1 TABLET BY MOUTH TWICE A DAY   clopidogrel (PLAVIX) 75 MG tablet TAKE 1 TABLET (75 MG TOTAL) BY MOUTH DAILY. PLEASE REQUEST FUTURE REFILLS FROM PCP.   ibuprofen (ADVIL) 200 MG tablet Take 200 mg by mouth every 6 (six) hours as needed.   latanoprost (XALATAN) 0.005 % ophthalmic solution Place 1 drop into both eyes at bedtime.   losartan (COZAAR) 25 MG tablet TAKE 2 TABLETS BY MOUTH EVERY DAY   timolol (TIMOPTIC) 0.5 % ophthalmic solution Place 1 drop into both eyes 2 (two) times daily.   No facility-administered encounter medications on file as of 01/05/2021.   GEN CALL: Patient stated he is feeling good overall. We dicussed his surgery and him waiting for his medical clearance to be signed. He stated his activity level is still good. He stated he still goes to play golf. He stated he still has a good appetite and eats a well rounded diet. I sent a message to the nurse to ensure they have his medical clearance paperwork signed and returned in time for his surgery on 01/15/21.   Care Gaps:Patient is due for his colonoscopy. BP:142/86  Star Rating Drugs:Losartan 25 mg 11/02/20 90 DS, Atorvastatin 80 mg 11/02/20 90 DS.  Follow-Up:Pharmacist Review  Charlann Lange, [place] Pharmacist Assistant [phone]

## 2021-01-09 ENCOUNTER — Encounter (HOSPITAL_BASED_OUTPATIENT_CLINIC_OR_DEPARTMENT_OTHER): Payer: Self-pay | Admitting: Urology

## 2021-01-09 ENCOUNTER — Other Ambulatory Visit: Payer: Self-pay

## 2021-01-09 DIAGNOSIS — M9902 Segmental and somatic dysfunction of thoracic region: Secondary | ICD-10-CM | POA: Diagnosis not present

## 2021-01-09 DIAGNOSIS — M9901 Segmental and somatic dysfunction of cervical region: Secondary | ICD-10-CM | POA: Diagnosis not present

## 2021-01-09 DIAGNOSIS — M5033 Other cervical disc degeneration, cervicothoracic region: Secondary | ICD-10-CM | POA: Diagnosis not present

## 2021-01-09 DIAGNOSIS — G43001 Migraine without aura, not intractable, with status migrainosus: Secondary | ICD-10-CM | POA: Diagnosis not present

## 2021-01-09 NOTE — Progress Notes (Signed)
Spoke w/ via phone for pre-op interview--- pt Lab needs dos----  no             Lab results------ pt getting lab work done 01-11-2021, CBC/ CMP/ PT/ PTT;  current ekg/ cxr in epic/ chart COVID test -----patient states asymptomatic no test needed Arrive at ------- 1015 on 01-15-2021 NPO after MN NO Solid Food.  Clear liquids from MN until--- 0915 Med rec completed Medications to take morning of surgery ----- lipitor, coreg, eye as usual Diabetic medication ----- n/a Patient instructed no nail polish to be worn day of surgery Patient instructed to bring photo id and insurance card day of surgery Patient aware to have Driver (ride ) / caregiver  for 24 hours after surgery --wife, donelle Patient Special Instructions ----- will do one fleet enema morning of surgery Pre-Op special Istructions ----- called Coni, OR scheduler for Dr Abner Greenspan, requested clearance for plavix. Patient verbalized understanding of instructions that were given at this phone interview. Patient denies shortness of breath, chest pain, fever, cough at this phone interview.    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

## 2021-01-11 ENCOUNTER — Other Ambulatory Visit: Payer: Self-pay

## 2021-01-11 ENCOUNTER — Encounter (HOSPITAL_COMMUNITY)
Admission: RE | Admit: 2021-01-11 | Discharge: 2021-01-11 | Disposition: A | Discharge status: Home / Self Care | Payer: Medicare HMO | Source: Ambulatory Visit | Attending: Urology | Admitting: Urology

## 2021-01-11 DIAGNOSIS — Z01812 Encounter for preprocedural laboratory examination: Secondary | ICD-10-CM | POA: Insufficient documentation

## 2021-01-11 LAB — APTT: aPTT: 32 seconds (ref 24–36)

## 2021-01-11 LAB — COMPREHENSIVE METABOLIC PANEL
ALT: 22 U/L (ref 0–44)
AST: 17 U/L (ref 15–41)
Albumin: 4 g/dL (ref 3.5–5.0)
Alkaline Phosphatase: 55 U/L (ref 38–126)
Anion gap: 6 (ref 5–15)
BUN: 14 mg/dL (ref 8–23)
CO2: 26 mmol/L (ref 22–32)
Calcium: 9.1 mg/dL (ref 8.9–10.3)
Chloride: 107 mmol/L (ref 98–111)
Creatinine, Ser: 0.7 mg/dL (ref 0.61–1.24)
GFR, Estimated: >60 mL/min (ref >60)
Glucose, Bld: 101 mg/dL — ABNORMAL HIGH (ref 70–99)
Potassium: 4.2 mmol/L (ref 3.5–5.1)
Sodium: 139 mmol/L (ref 135–145)
Total Bilirubin: 0.6 mg/dL (ref 0.3–1.2)
Total Protein: 7.2 g/dL (ref 6.5–8.1)

## 2021-01-11 LAB — CBC
HCT: 47.8 % (ref 39.0–52.0)
Hemoglobin: 16.1 g/dL (ref 13.0–17.0)
MCH: 30.7 pg (ref 26.0–34.0)
MCHC: 33.7 g/dL (ref 30.0–36.0)
MCV: 91 fL (ref 80.0–100.0)
Platelets: 260 10*3/uL (ref 150–400)
RBC: 5.25 MIL/uL (ref 4.22–5.81)
RDW: 13 % (ref 11.5–15.5)
WBC: 11.6 10*3/uL — ABNORMAL HIGH (ref 4.0–10.5)
nRBC: 0 % (ref 0.0–0.2)

## 2021-01-11 LAB — PROTIME-INR
INR: 0.9 (ref 0.8–1.2)
Prothrombin Time: 12.4 seconds (ref 11.4–15.2)

## 2021-01-12 ENCOUNTER — Telehealth: Payer: Self-pay | Admitting: *Deleted

## 2021-01-12 NOTE — Telephone Encounter (Signed)
CALLED PATIENT TO REMIND OF PROCEDURE FOR 01-15-21, SPOKE WITH PATIENT AND HE IS AWARE OF THIS PROCEDURE

## 2021-01-15 ENCOUNTER — Ambulatory Visit (HOSPITAL_BASED_OUTPATIENT_CLINIC_OR_DEPARTMENT_OTHER): Payer: Medicare HMO | Admitting: Certified Registered"

## 2021-01-15 ENCOUNTER — Encounter (HOSPITAL_BASED_OUTPATIENT_CLINIC_OR_DEPARTMENT_OTHER): Payer: Self-pay | Admitting: Urology

## 2021-01-15 ENCOUNTER — Other Ambulatory Visit: Payer: Self-pay

## 2021-01-15 ENCOUNTER — Encounter (HOSPITAL_BASED_OUTPATIENT_CLINIC_OR_DEPARTMENT_OTHER)
Admission: RE | Disposition: A | Discharge status: Home / Self Care | Payer: Self-pay | Source: Ambulatory Visit | Attending: Urology

## 2021-01-15 ENCOUNTER — Ambulatory Visit (HOSPITAL_COMMUNITY): Payer: Medicare HMO

## 2021-01-15 ENCOUNTER — Ambulatory Visit (HOSPITAL_BASED_OUTPATIENT_CLINIC_OR_DEPARTMENT_OTHER)
Admission: RE | Admit: 2021-01-15 | Discharge: 2021-01-15 | Disposition: A | Discharge status: Home / Self Care | Payer: Medicare HMO | Source: Ambulatory Visit | Attending: Urology | Admitting: Urology

## 2021-01-15 DIAGNOSIS — Z8249 Family history of ischemic heart disease and other diseases of the circulatory system: Secondary | ICD-10-CM | POA: Insufficient documentation

## 2021-01-15 DIAGNOSIS — Z8616 Personal history of COVID-19: Secondary | ICD-10-CM | POA: Diagnosis not present

## 2021-01-15 DIAGNOSIS — Z87891 Personal history of nicotine dependence: Secondary | ICD-10-CM | POA: Insufficient documentation

## 2021-01-15 DIAGNOSIS — Z888 Allergy status to other drugs, medicaments and biological substances status: Secondary | ICD-10-CM | POA: Insufficient documentation

## 2021-01-15 DIAGNOSIS — Z79899 Other long term (current) drug therapy: Secondary | ICD-10-CM | POA: Diagnosis not present

## 2021-01-15 DIAGNOSIS — Z955 Presence of coronary angioplasty implant and graft: Secondary | ICD-10-CM | POA: Insufficient documentation

## 2021-01-15 DIAGNOSIS — C61 Malignant neoplasm of prostate: Secondary | ICD-10-CM | POA: Diagnosis not present

## 2021-01-15 DIAGNOSIS — I2510 Atherosclerotic heart disease of native coronary artery without angina pectoris: Secondary | ICD-10-CM | POA: Diagnosis not present

## 2021-01-15 DIAGNOSIS — E785 Hyperlipidemia, unspecified: Secondary | ICD-10-CM | POA: Diagnosis not present

## 2021-01-15 DIAGNOSIS — Z803 Family history of malignant neoplasm of breast: Secondary | ICD-10-CM | POA: Diagnosis not present

## 2021-01-15 DIAGNOSIS — Z809 Family history of malignant neoplasm, unspecified: Secondary | ICD-10-CM | POA: Insufficient documentation

## 2021-01-15 DIAGNOSIS — I10 Essential (primary) hypertension: Secondary | ICD-10-CM | POA: Diagnosis not present

## 2021-01-15 HISTORY — DX: Unspecified osteoarthritis, unspecified site: M19.90

## 2021-01-15 HISTORY — DX: Long term (current) use of anticoagulants: Z79.01

## 2021-01-15 HISTORY — DX: Primary hyperparathyroidism: E21.0

## 2021-01-15 HISTORY — DX: Unspecified glaucoma: H40.9

## 2021-01-15 HISTORY — DX: Atherosclerotic heart disease of native coronary artery without angina pectoris: I25.10

## 2021-01-15 HISTORY — PX: RADIOACTIVE SEED IMPLANT: SHX5150

## 2021-01-15 HISTORY — DX: Presence of dental prosthetic device (complete) (partial): Z97.2

## 2021-01-15 HISTORY — DX: Malignant neoplasm of prostate: C61

## 2021-01-15 HISTORY — DX: Presence of coronary angioplasty implant and graft: Z95.5

## 2021-01-15 HISTORY — DX: Personal history of colon polyps, unspecified: Z86.0100

## 2021-01-15 HISTORY — PX: SPACE OAR INSTILLATION: SHX6769

## 2021-01-15 HISTORY — DX: Personal history of colonic polyps: Z86.010

## 2021-01-15 HISTORY — PX: CYSTOSCOPY: SHX5120

## 2021-01-15 SURGERY — INSERTION, RADIATION SOURCE, PROSTATE
Anesthesia: General | Site: Prostate

## 2021-01-15 MED ORDER — DEXAMETHASONE SODIUM PHOSPHATE 10 MG/ML IJ SOLN
INTRAMUSCULAR | Status: DC | PRN
  Administered 2021-01-15: 10 mg via INTRAVENOUS

## 2021-01-15 MED ORDER — STERILE WATER FOR IRRIGATION IR SOLN
Status: DC | PRN
  Administered 2021-01-15: 3 mL

## 2021-01-15 MED ORDER — ONDANSETRON HCL 4 MG/2ML IJ SOLN
INTRAMUSCULAR | Status: AC
  Filled 2021-01-15: qty 2

## 2021-01-15 MED ORDER — EPHEDRINE 5 MG/ML INJ
INTRAVENOUS | Status: AC
  Filled 2021-01-15: qty 5

## 2021-01-15 MED ORDER — FENTANYL CITRATE (PF) 100 MCG/2ML IJ SOLN
INTRAMUSCULAR | Status: AC
  Filled 2021-01-15: qty 2

## 2021-01-15 MED ORDER — FLEET ENEMA 7-19 GM/118ML RE ENEM: 1.0000 | ENEMA | Freq: Once | RECTAL | Status: DC

## 2021-01-15 MED ORDER — LIDOCAINE 2% (20 MG/ML) 5 ML SYRINGE
INTRAMUSCULAR | Status: DC | PRN
  Administered 2021-01-15: 60 mg via INTRAVENOUS

## 2021-01-15 MED ORDER — SODIUM CHLORIDE 0.9 % IR SOLN
Status: DC | PRN
  Administered 2021-01-15: 200 mL

## 2021-01-15 MED ORDER — LACTATED RINGERS IV SOLN: INTRAVENOUS | Status: DC

## 2021-01-15 MED ORDER — PROPOFOL 10 MG/ML IV BOLUS
INTRAVENOUS | Status: DC | PRN
  Administered 2021-01-15: 150 mg via INTRAVENOUS
  Administered 2021-01-15 (×2): 50 mg via INTRAVENOUS

## 2021-01-15 MED ORDER — CIPROFLOXACIN IN D5W 400 MG/200ML IV SOLN
400.0000 mg | INTRAVENOUS | Status: AC
  Administered 2021-01-15: 400 mg via INTRAVENOUS

## 2021-01-15 MED ORDER — SODIUM CHLORIDE (PF) 0.9 % IJ SOLN
INTRAMUSCULAR | Status: DC | PRN
  Administered 2021-01-15: 10 mL

## 2021-01-15 MED ORDER — ONDANSETRON HCL 4 MG/2ML IJ SOLN
INTRAMUSCULAR | Status: DC | PRN
  Administered 2021-01-15: 4 mg via INTRAVENOUS

## 2021-01-15 MED ORDER — OXYCODONE-ACETAMINOPHEN 5-325 MG PO TABS: 1.0000 | ORAL_TABLET | ORAL | 0 refills | Status: DC | PRN

## 2021-01-15 MED ORDER — FENTANYL CITRATE (PF) 100 MCG/2ML IJ SOLN
INTRAMUSCULAR | Status: DC | PRN
  Administered 2021-01-15 (×2): 25 ug via INTRAVENOUS
  Administered 2021-01-15: 50 ug via INTRAVENOUS

## 2021-01-15 MED ORDER — ACETAMINOPHEN 500 MG PO TABS
ORAL_TABLET | ORAL | Status: AC
  Filled 2021-01-15: qty 2

## 2021-01-15 MED ORDER — DEXAMETHASONE SODIUM PHOSPHATE 10 MG/ML IJ SOLN
INTRAMUSCULAR | Status: AC
  Filled 2021-01-15: qty 1

## 2021-01-15 MED ORDER — ACETAMINOPHEN 500 MG PO TABS
1000.0000 mg | ORAL_TABLET | Freq: Once | ORAL | Status: AC
  Administered 2021-01-15: 1000 mg via ORAL

## 2021-01-15 MED ORDER — CIPROFLOXACIN IN D5W 400 MG/200ML IV SOLN
INTRAVENOUS | Status: AC
  Filled 2021-01-15: qty 200

## 2021-01-15 MED ORDER — IOHEXOL 300 MG/ML  SOLN
INTRAMUSCULAR | Status: DC | PRN
  Administered 2021-01-15: 7 mL

## 2021-01-15 MED ORDER — EPHEDRINE SULFATE-NACL 50-0.9 MG/10ML-% IV SOSY
PREFILLED_SYRINGE | INTRAVENOUS | Status: DC | PRN
  Administered 2021-01-15: 10 mg via INTRAVENOUS

## 2021-01-15 MED ORDER — DOCUSATE SODIUM 100 MG PO CAPS: 100.0000 mg | ORAL_CAPSULE | Freq: Every day | ORAL | 0 refills | Status: DC | PRN

## 2021-01-15 SURGICAL SUPPLY — 35 items
BAG DRN RND TRDRP ANRFLXCHMBR (UROLOGICAL SUPPLIES) ×2
BAG URINE DRAIN 2000ML AR STRL (UROLOGICAL SUPPLIES) ×3 IMPLANT
BLADE CLIPPER SENSICLIP SURGIC (BLADE) ×6 IMPLANT
Bard Brachysource I-125 Implant Seed ×210 IMPLANT
CATH FOLEY 2WAY SLVR  5CC 16FR (CATHETERS) ×1
CATH FOLEY 2WAY SLVR 5CC 16FR (CATHETERS) ×2 IMPLANT
CATH ROBINSON RED A/P 16FR (CATHETERS) IMPLANT
CATH ROBINSON RED A/P 20FR (CATHETERS) ×3 IMPLANT
CLOTH BEACON ORANGE TIMEOUT ST (SAFETY) ×3 IMPLANT
CNTNR URN SCR LID CUP LEK RST (MISCELLANEOUS) ×4 IMPLANT
CONT SPEC 4OZ STRL OR WHT (MISCELLANEOUS) ×6
COVER BACK TABLE 60X90IN (DRAPES) ×3 IMPLANT
COVER MAYO STAND STRL (DRAPES) ×3 IMPLANT
DRSG TEGADERM 4X4.75 (GAUZE/BANDAGES/DRESSINGS) ×3 IMPLANT
DRSG TEGADERM 8X12 (GAUZE/BANDAGES/DRESSINGS) ×3 IMPLANT
GAUZE SPONGE 4X4 12PLY STRL LF (GAUZE/BANDAGES/DRESSINGS) ×3 IMPLANT
GLOVE SURG ENC MOIS LTX SZ6 (GLOVE) IMPLANT
GLOVE SURG ENC MOIS LTX SZ6.5 (GLOVE) IMPLANT
GLOVE SURG ENC MOIS LTX SZ7 (GLOVE) ×3 IMPLANT
GLOVE SURG ENC MOIS LTX SZ8 (GLOVE) IMPLANT
GLOVE SURG ORTHO LTX SZ8.5 (GLOVE) IMPLANT
GLOVE SURG UNDER POLY LF SZ6.5 (GLOVE) IMPLANT
GOWN STRL REUS W/TWL LRG LVL3 (GOWN DISPOSABLE) ×6 IMPLANT
HOLDER FOLEY CATH W/STRAP (MISCELLANEOUS) IMPLANT
IMPL SPACEOAR VUE SYSTEM (Spacer) ×2 IMPLANT
IMPLANT SPACEOAR VUE SYSTEM (Spacer) ×3 IMPLANT
IV NS 1000ML (IV SOLUTION) ×3
IV NS 1000ML BAXH (IV SOLUTION) ×2 IMPLANT
KIT TURNOVER CYSTO (KITS) ×3 IMPLANT
PACK CYSTO (CUSTOM PROCEDURE TRAY) ×3 IMPLANT
SUT BONE WAX W31G (SUTURE) IMPLANT
SYR 10ML LL (SYRINGE) ×3 IMPLANT
TOWEL OR 17X26 10 PK STRL BLUE (TOWEL DISPOSABLE) ×3 IMPLANT
UNDERPAD 30X36 HEAVY ABSORB (UNDERPADS AND DIAPERS) ×6 IMPLANT
WATER STERILE IRR 500ML POUR (IV SOLUTION) ×3 IMPLANT

## 2021-01-15 NOTE — Progress Notes (Signed)
  Radiation Oncology         [phone]) [phone] ________________________________  Name: Todd Patterson MRN: [id number]  Date: 01/15/2021  DOB: Jul 26, 1950       Prostate Seed Implant  TD:[id number], Todd Mcgee, MD  No ref. provider found  DIAGNOSIS: 70 y.o. gentleman with Stage T1c adenocarcinoma of the prostate with Gleason score of 3+3, and PSA of 8.82.  Oncology History  Malignant neoplasm of prostate ([place])  09/13/2020 Cancer Staging   Staging form: Prostate, AJCC 8th Edition - Clinical stage from 09/13/2020: Stage I (cT1c, cN0, cM0, PSA: 8.8, Grade Group: 1) - Signed by Freeman Caldron, PA-C on 10/12/2020 Histopathologic type: Adenocarcinoma, NOS Stage prefix: Initial diagnosis Prostate specific antigen (PSA) range: Less than 10 Gleason primary pattern: 3 Gleason secondary pattern: 3 Gleason score: 6 Histologic grading system: 5 grade system Number of biopsy cores examined: 12 Number of biopsy cores positive: 9 Location of positive needle core biopsies: Both sides    10/12/2020 Initial Diagnosis   Malignant neoplasm of prostate ([facility])     PROCEDURE: Insertion of radioactive I-125 seeds into the prostate gland.  RADIATION DOSE: 145 Gy, definitive therapy.  TECHNIQUE: Todd Patterson was brought to the operating room with the urologist. He was placed in the dorsolithotomy position. He was catheterized and a rectal tube was inserted. The perineum was shaved, prepped and draped. The ultrasound probe was then introduced into the rectum to see the prostate gland.  TREATMENT DEVICE: A needle grid was attached to the ultrasound probe stand and anchor needles were placed.  3D PLANNING: The prostate was imaged in 3D using a sagittal sweep of the prostate probe. These images were transferred to the planning computer. There, the prostate, urethra and rectum were defined on each axial reconstructed image. Then, the software created an optimized 3D plan and a few seed positions were adjusted. The  quality of the plan was reviewed using [facility] information for the target and the following two organs at risk:  Urethra and Rectum.  Then the accepted plan was printed and handed off to the radiation therapist.  Under my supervision, the custom loading of the seeds and spacers was carried out and loaded into sealed vicryl sleeves.  These pre-loaded needles were then placed into the needle holder.Marland Kitchen  PROSTATE VOLUME STUDY:  Using transrectal ultrasound the volume of the prostate was verified to be 37.8 cc.  SPECIAL TREATMENT PROCEDURE/SUPERVISION AND HANDLING: The pre-loaded needles were then delivered under sagittal guidance. A total of 18 needles were used to deposit 70 seeds in the prostate gland. The individual seed activity was 0.413 mCi.  SpaceOAR:  Yes  COMPLEX SIMULATION: At the end of the procedure, an anterior radiograph of the pelvis was obtained to document seed positioning and count. Cystoscopy was performed to check the urethra and bladder.  MICRODOSIMETRY: At the end of the procedure, the patient was emitting 0.044 mR/hr at 1 meter. Accordingly, he was considered safe for hospital discharge.  PLAN: The patient will return to the radiation oncology clinic for post implant CT dosimetry in three weeks.   ________________________________  Todd Patterson, M.D.

## 2021-01-15 NOTE — Discharge Instructions (Addendum)
Activity:  You are encouraged to ambulate frequently (about every hour during waking hours) to help prevent blood clots from forming in your legs or lungs.    Diet: You should advance your diet as instructed by your physician.  It will be normal to have some bloating, nausea, and abdominal discomfort intermittently.  Prescriptions:  You will be provided a prescription for pain medication to take as needed.  If your pain is not severe enough to require the prescription pain medication, you may take extra strength Tylenol instead which will have less side effects.  You should also take a prescribed stool softener to avoid straining with bowel movements as the prescription pain medication may constipate you.  What to call us about: You should call the office [phone]) if you develop fever > 101 or develop persistent vomiting. Activity:  You are encouraged to ambulate frequently (about every hour during waking hours) to help prevent blood clots from forming in your legs or lungs.     No acetaminophen/Tylenol until after 4:00pm today if needed for pain.      Post Anesthesia Home Care Instructions  Activity: Get plenty of rest for the remainder of the day. A responsible individual must stay with you for 24 hours following the procedure.  For the next 24 hours, DO NOT: -Drive a car -Paediatric nurse -Drink alcoholic beverages -Take any medication unless instructed by your physician -Make any legal decisions or sign important papers.  Meals: Start with liquid foods such as gelatin or soup. Progress to regular foods as tolerated. Avoid greasy, spicy, heavy foods. If nausea and/or vomiting occur, drink only clear liquids until the nausea and/or vomiting subsides. Call your physician if vomiting continues.  Special Instructions/Symptoms: Your throat may feel dry or sore from the anesthesia or the breathing tube placed in your throat during surgery. If this causes discomfort, gargle with warm  salt water. The discomfort should disappear within 24 hours.

## 2021-01-15 NOTE — Anesthesia Preprocedure Evaluation (Signed)
Anesthesia Evaluation  Patient identified by MRN, date of birth, ID band Patient awake    Reviewed: Allergy & Precautions, NPO status , Patient's Chart, lab work & pertinent test results  Airway Mallampati: II  TM Distance: >3 FB Neck ROM: Full    Dental  (+) Dental Advisory Given   Pulmonary former smoker,    breath sounds clear to auscultation       Cardiovascular hypertension, Pt. on medications and Pt. on home beta blockers + CAD and + Cardiac Stents   Rhythm:Regular Rate:Normal     Neuro/Psych  Neuromuscular disease CVA    GI/Hepatic negative GI ROS, Neg liver ROS,   Endo/Other  negative endocrine ROS  Renal/GU negative Renal ROS     Musculoskeletal  (+) Arthritis ,   Abdominal   Peds  Hematology negative hematology ROS (+)   Anesthesia Other Findings   Reproductive/Obstetrics                             Lab Results  Component Value Date   WBC 11.6 (H) 01/11/2021   HGB 16.1 01/11/2021   HCT 47.8 01/11/2021   MCV 91.0 01/11/2021   PLT 260 01/11/2021   Lab Results  Component Value Date   CREATININE 0.70 01/11/2021   BUN 14 01/11/2021   NA 139 01/11/2021   K 4.2 01/11/2021   CL 107 01/11/2021   CO2 26 01/11/2021    Anesthesia Physical Anesthesia Plan  ASA: 3  Anesthesia Plan: General   Post-op Pain Management:    Induction: Intravenous  PONV Risk Score and Plan: 2 and Dexamethasone, Ondansetron and Treatment may vary due to age or medical condition  Airway Management Planned: LMA  Additional Equipment: None  Intra-op Plan:   Post-operative Plan: Extubation in OR  Informed Consent: I have reviewed the patients History and Physical, chart, labs and discussed the procedure including the risks, benefits and alternatives for the proposed anesthesia with the patient or authorized representative who has indicated his/her understanding and acceptance.     Dental  advisory given  Plan Discussed with: CRNA  Anesthesia Plan Comments:         Anesthesia Quick Evaluation

## 2021-01-15 NOTE — Op Note (Signed)
PATIENT:  Todd Patterson  PRE-OPERATIVE DIAGNOSIS:  Adenocarcinoma of the prostate  POST-OPERATIVE DIAGNOSIS:  Same  PROCEDURE:  1. I-125 radioactive seed implantation 2. Cystoscopy  3. Placement of SpaceOAR  SURGEON:  Rexene Alberts, MD  Radiation oncologist: Tyler Pita, MD  ANESTHESIA:  General  EBL:  Minimal  DRAINS: None  INDICATION: Todd Patterson  Description of procedure: After informed consent the patient was brought to the major OR, placed on the table and administered general anesthesia. He was then moved to the modified lithotomy position with his perineum perpendicular to the floor. His perineum and genitalia were then sterilely prepped. An official timeout was then performed. A 16 French Foley catheter was then placed in the bladder and filled with dilute contrast, a rectal tube was placed in the rectum and the transrectal ultrasound probe was placed in the rectum and affixed to the stand. He was then sterilely draped.  Real time ultrasonography was used along with the seed planning software. This was used to develop the seed plan including the number of needles as well as number of seeds required for complete and adequate coverage. Real-time ultrasonography was then used along with the previously developed plan and the Nucletron device to implant a total of 70 seeds using 18 needles. This proceeded without difficulty or complication.   I then proceeded with placement of SpaceOAR by introducing a needle with the bevel angled inferiorly approximately 2 cm superior to the anus. This was angled downward and under direct ultrasound was placed within the space between the prostatic capsule and rectum. This was confirmed with a small amount of sterile saline injected and this was performed under direct ultrasound. I then attached the SpaceOAR to the needle and injected this in the space between the prostate and rectum with good placement noted.  A Foley catheter was then removed  as well as the transrectal ultrasound probe and rectal probe. Flexible cystoscopy was then performed using the 16 French flexible scope which revealed a normal urethra throughout its length down to the sphincter which appeared intact. The prostatic urethra revealed bilobar hypertrophy but no evidence of obstruction, seeds, spacers or lesions. The bladder was then entered and fully and systematically inspected. The ureteral orifices were noted to be of normal configuration and position. The mucosa revealed no evidence of tumors. There were also no stones identified within the bladder. I noted no seeds or spacers on the floor of the bladder and retroflexion of the scope revealed no seeds protruding from the base of the prostate.  The cystoscope was then removed and the patient was awakened and taken to recovery room in stable and satisfactory condition. He tolerated procedure well and there were no intraoperative complications.  [facility]  Pager: [phone]

## 2021-01-15 NOTE — Transfer of Care (Signed)
Immediate Anesthesia Transfer of Care Note  Patient: Todd Patterson  Procedure(s) Performed: RADIOACTIVE SEED IMPLANT/BRACHYTHERAPY IMPLANT (Prostate) SPACE OAR INSTILLATION (Prostate) CYSTOSCOPY FLEXIBLE (Bladder)  Patient Location: PACU  Anesthesia Type:General  Level of Consciousness: awake, alert , oriented and patient cooperative  Airway & Oxygen Therapy: Patient Spontanous Breathing and Patient connected to face mask oxygen  Post-op Assessment: Report given to RN, Post -op Vital signs reviewed and stable and Patient moving all extremities  Post vital signs: Reviewed and stable  Last Vitals:  Vitals Value Taken Time  BP 130/77 01/15/21 1325  Temp    Pulse 69 01/15/21 1328  Resp 10 01/15/21 1328  SpO2 94 % 01/15/21 1328  Vitals shown include unvalidated device data.  Last Pain:  Vitals:   01/15/21 1100  TempSrc: Oral  PainSc: 0-No pain      Patients Stated Pain Goal: 6 (73/56/70 1410)  Complications: No notable events documented.

## 2021-01-15 NOTE — H&P (Signed)
Office Visit Report     12/22/2020     CC/HPI: Todd Patterson is a 70 year old male seen in followup with localized low risk prostate cancer.   He has met with Dr. Tammi Klippel and has elected proceed with brachytherapy.   Patient underwent prostate biopsy on 09/13/2020 for an elevated PSA of 8.82 ng/mL. Biopsy revealed GS 3+3 = 6 and 9/12 cores, 1 core atypia, adenocarcinoma of the prostate with 9/12 cores positive (5-90%), TRUS volume of 30.4 cm3. Denies new or worsening bone or back pain. Good appetite and stable weight.   Family history: Denies  Imaging studies: None   PMH: TIA (on plavix), HTN, HLD, MI  PSH: None   TNM stage: T1cNxMx  PSA: 8.82  Gleason score: 3+3=6  Biopsy: 09/13/2020  Left: Left lateral base, left base, left lateral mid, left mid  Right: Right base, right lateral mid, right mid, right lateral apex, right apex  Prostate volume: 30cm^3  PSAD: 0.29   Nomogram  CSS (15 year): 99%  PFS (5 year, 10 year): 80%, 79%  EPE: 47%  LNI: 4%  SVI: 4%   IPSS: 5  SHIM: He did not complete this form   12/22/2020: Here today for preoperative exam prior to undergoing radioactive seed implant with SpaceOAR insertion on 10/31. Overall doing well. He denies any changes to past medical history, prescription medications taken on a daily basis, no interval surgical or procedural intervention. Voiding symptoms are grossly stable, mostly non bothersome. He has occasional postvoid leaking. Denies bothersome daytime frequency/urgency, bothersome nocturia. Denies changes in force of stream or increased sensation of incomplete bladder emptying. He has had no interval dysuria, gross hematuria. He denies any recent chest pain, shortness of breath, lightheadedness or dizziness, paresthesias. No recent fever/chills, nausea/vomiting. He is having normal bowel movements.     ALLERGIES: NKDA    MEDICATIONS: Atorvastatin Calcium  Carvedilol 6.25 mg tablet  Clopidogrel 75 mg tablet  Losartan  Potassium 50 mg tablet     GU PSH: Prostate Needle Biopsy - 09/13/2020     NON-GU PSH: Surgical Pathology, Gross And Microscopic Examination For Prostate Needle - 09/13/2020     GU PMH: Prostate Cancer - 11/02/2020, - 09/28/2020 Nocturia - 09/28/2020, - 09/13/2020, - 02/04/2020 Urinary Frequency - 09/28/2020, - 09/13/2020, - 02/04/2020 BPH w/LUTS - 09/13/2020, - 02/04/2020 Elevated PSA - 09/13/2020, - 02/04/2020    NON-GU PMH: Hypercholesterolemia Hypertension Myocardial Infarction    FAMILY HISTORY: 2 daughters - Runs in Family   SOCIAL HISTORY: Marital Status: Married Ethnicity: Not Hispanic Or Latino; Race: White Current Smoking Status: Patient does not smoke anymore. Has not smoked since 01/17/2008. Smoked for 32 years.   Tobacco Use Assessment Completed: Used Tobacco in last 30 days? Has never drank.  Drinks 1 caffeinated drink per day.    REVIEW OF SYSTEMS:    GU Review Male:   Patient reports leakage of urine. Patient denies frequent urination, hard to postpone urination, burning/ pain with urination, get up at night to urinate, trouble starting your stream, have to strain to urinate , erection problems, and penile pain.  Gastrointestinal (Upper):   Patient denies nausea, vomiting, and indigestion/ heartburn.  Gastrointestinal (Lower):   Patient denies diarrhea and constipation.  Constitutional:   Patient denies fever, night sweats, weight loss, and fatigue.  Skin:   Patient denies skin rash/ lesion and itching.  Eyes:   Patient denies blurred vision and double vision.  Ears/ Nose/ Throat:   Patient denies sore throat and sinus problems.  Hematologic/Lymphatic:   Patient denies swollen glands and easy bruising.  Cardiovascular:   Patient denies leg swelling and chest pains.  Respiratory:   Patient denies cough and shortness of breath.  Endocrine:   Patient denies excessive thirst.  Musculoskeletal:   Patient denies back pain and joint pain.  Neurological:   Patient denies  headaches and dizziness.  Psychologic:   Patient denies depression and anxiety.   VITAL SIGNS:      12/22/2020 08:03 AM  Weight 214 lb / 97.07 kg  Height 68 in / 172.72 cm  BP 161/74 mmHg  Pulse 67 /min  BMI 32.5 kg/m   MULTI-SYSTEM PHYSICAL EXAMINATION:    Constitutional: Well-nourished. No physical deformities. Normally developed. Good grooming.  Neck: Neck symmetrical, not swollen. Normal tracheal position.  Respiratory: No labored breathing, no use of accessory muscles.   Cardiovascular: Normal temperature, normal extremity pulses, no swelling, no varicosities.  Skin: No paleness, no jaundice, no cyanosis. No lesion, no ulcer, no rash.  Neurologic / Psychiatric: Oriented to time, oriented to place, oriented to person. No depression, no anxiety, no agitation.  Gastrointestinal: Obese abdomen. No mass, no tenderness, no rigidity.   Musculoskeletal: Normal gait and station of head and neck.     Complexity of Data:  Source Of History:  Patient, Medical Record Summary  Lab Test Review:   PSA  Records Review:   Pathology Reports, Previous Doctor Records, Previous Hospital Records, Previous Patient Records  Urine Test Review:   Urinalysis   09/13/20  PSA  Total PSA 8.82 ng/mL    12/22/20 12/22/20  Urinalysis  Urine Appearance Clear  Clear   Urine Color Yellow  Yellow   Urine Glucose Neg mg/dL Neg   Urine Bilirubin Neg mg/dL Neg   Urine Ketones Neg mg/dL Neg   Urine Specific Gravity 1.025  1.025   Urine Blood Neg ery/uL Neg   Urine pH <=5.0  <=5.0   Urine Protein Neg mg/dL Neg   Urine Urobilinogen 0.2 mg/dL 0.2   Urine Nitrites Neg  Neg   Urine Leukocyte Esterase Neg leu/uL Neg    PROCEDURES:          Urinalysis - 81003 Dipstick Dipstick Cont'd  Color: Yellow Bilirubin: Neg  Appearance: Clear Ketones: Neg  Specific Gravity: 1.025 Blood: Neg  pH: <=5.0 Protein: Neg  Glucose: Neg Urobilinogen: 0.2    Nitrites: Neg    Leukocyte Esterase: Neg    Notes:       ASSESSMENT:      ICD-10 Details  1 GU:   Prostate Cancer - C61 Chronic, Threat to Bodily Function  2 NON-GU:   Encounter for other preprocedural examination - Z01.818 Undiagnosed New Problem   PLAN:           Orders Labs Urine Culture          Schedule Return Visit/Planned Activity: Keep Scheduled Appointment - Follow up MD, Schedule Surgery          Document Letter(s):  Created for Patient: Clinical Summary         Notes:   All questions answered to the best of my ability regarding upcoming procedure and expected postoperative course. Precautionary urine culture sent today to serve as baseline for his upcoming procedure. He will proceed with previously scheduled radioactive seed implant on 10/31 with his urologist.        Next Appointment:      Next Appointment: 01/15/2021 12:15 PM    Appointment Type: Surgery  Location: [facility] [phone]    Provider: Rexene Alberts, M.D.    Reason for Visit: NE/OP BRACHYTHERAPY, SPACE OAR     Urology Preoperative H&P   Chief Complaint: Prostate cancer  History of Present Illness: Todd Patterson is a 70 y.o. male with prostate cancer here for brachytherapy.    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   neurologist-- dr Krista Blue;  09-05-2017 acute onset right 6th nerve palsy with ischemic event,  started on plavix;  06/ 2021 left 6th nerve palsy probable same etiology as the right   Anticoagulant long-term use    plavix---  managed by pcp   Arthritis    Coronary artery disease    cardiologist--- dr Gwenlyn Found; 2010  hx MI s/p cath with DES x1  to LAD per cardiology note done in [place] present    x2 upper  both permanent   Glaucoma, both eyes    History of COVID-19 03/21/2020   result in epic   History of myocardial infarction 2010   PCI w/ DES to LAD   Hx of colonic polyps    colonoscopy 2018, recommended repeat in 1 year due to numbe rof polyps.     Hyperlipidemia    Hypertension    Malignant neoplasm prostate [facility])    urologist-- dr Raijon Lindfors;  dx 06/ 2022, gleason 3+3, PSA 8.82   Primary hyperparathyroidism ([place])    S/P drug eluting coronary stent placement 2010   to LAD    Past Surgical History:  Procedure Laterality Date   APPENDECTOMY  1974   age 40   CORONARY ANGIOPLASTY WITH STENT PLACEMENT  2010   DES x1 LAD  (in [place])   PARATHYROIDECTOMY N/A 07/04/2016   Procedure: PARATHYROIDECTOMY;  Surgeon: Jackolyn Confer, MD;  Location: [facility];  Service: General;  Laterality: N/A;   TONSILLECTOMY AND ADENOIDECTOMY  1970   age 65    Allergies:  Allergies  Allergen Reactions   Lisinopril Cough    Family History  Problem Relation Age of Onset   Dementia Mother    Heart disease Father    Cancer Sister    Breast cancer Sister     Social History:  reports that he quit smoking about 12 years ago. His smoking use included cigarettes. He has a 84.00 pack-year smoking history. He has never used smokeless tobacco. He reports current alcohol use of about 14.0 - 21.0 standard drinks per week. He reports that he does not use drugs.  ROS: A complete review of systems was performed.  All systems are negative except for pertinent findings as noted.  Physical Exam:  Vital signs in last 24 hours: Temp:  [97.9 F (36.6 C)] 97.9 F (36.6 C) (10/31 1100) Pulse Rate:  [65] 65 (10/31 1100) Resp:  [16] 16 (10/31 1100) BP: (172)/(76) 172/76 (10/31 1100) SpO2:  [97 %] 97 % (10/31 1100) Weight:  [97.5 kg] 97.5 kg (10/31 1100) Constitutional:  Alert and oriented, No acute distress Cardiovascular: Regular rate and rhythm Respiratory: Normal respiratory effort, Lungs clear bilaterally GI: Abdomen is soft, nontender, nondistended, no abdominal masses GU: No CVA tenderness Lymphatic: No lymphadenopathy Neurologic: Grossly intact, no focal deficits Psychiatric: Normal mood and affect  Laboratory Data:  No results for input(s): WBC, HGB,  HCT, PLT in the last 72 hours.  No results for input(s): NA, K, CL, GLUCOSE, BUN, CALCIUM, CREATININE in the last 72 hours.  Invalid input(s): CO3  No results found for this or any previous visit (from the past 24 hour(s)). No results found for this or any previous visit (from the past 240 hour(s)).  Renal Function: Recent Labs    01/11/21 0906  CREATININE 0.70   Estimated Creatinine Clearance: 100.6 mL/min (by C-G formula based on SCr of 0.7 mg/dL).  Radiologic Imaging: No results found.  I independently reviewed the above imaging studies.  Assessment and Plan Todd Patterson is a 70 y.o. male with prostate cancer here for brachytherapy. Ok to proceed.  Matt R. Gay MD 01/15/2021, 11:49 AM  [facility] Pager: [phone]): [phone]

## 2021-01-15 NOTE — Anesthesia Postprocedure Evaluation (Signed)
Anesthesia Post Note  Patient: Todd Patterson  Procedure(s) Performed: RADIOACTIVE SEED IMPLANT/BRACHYTHERAPY IMPLANT (Prostate) SPACE OAR INSTILLATION (Prostate) CYSTOSCOPY FLEXIBLE (Bladder)     Patient location during evaluation: PACU Anesthesia Type: General Level of consciousness: awake and alert, oriented and patient cooperative Pain management: pain level controlled Vital Signs Assessment: post-procedure vital signs reviewed and stable Respiratory status: spontaneous breathing, nonlabored ventilation and respiratory function stable Cardiovascular status: blood pressure returned to baseline and stable Postop Assessment: no apparent nausea or vomiting Anesthetic complications: no   No notable events documented.  Last Vitals:  Vitals:   01/15/21 1345 01/15/21 1400  BP: (!) 157/72 (!) 161/85  Pulse: (!) 57 61  Resp: 10 13  Temp:  (!) 36.4 C  SpO2: 93% 92%    Last Pain:  Vitals:   01/15/21 1400  TempSrc:   PainSc: [place]

## 2021-01-15 NOTE — Anesthesia Procedure Notes (Signed)
Procedure Name: LMA Insertion Date/Time: 01/15/2021 12:15 PM Performed by: Myna Bright, CRNA Pre-anesthesia Checklist: Patient identified, Emergency Drugs available, Patient being monitored and Suction available Patient Re-evaluated:Patient Re-evaluated prior to induction Oxygen Delivery Method: Circle system utilized Preoxygenation: Pre-oxygenation with 100% oxygen Induction Type: IV induction LMA: LMA inserted LMA Size: 4.0 Tube type: Oral Number of attempts: 1 Placement Confirmation: positive ETCO2 and breath sounds checked- equal and bilateral Tube secured with: Tape Dental Injury: Teeth and Oropharynx as per pre-operative assessment

## 2021-01-16 ENCOUNTER — Encounter (HOSPITAL_BASED_OUTPATIENT_CLINIC_OR_DEPARTMENT_OTHER): Payer: Self-pay | Admitting: Urology

## 2021-01-18 ENCOUNTER — Telehealth: Payer: Self-pay

## 2021-01-22 ENCOUNTER — Telehealth: Payer: Self-pay | Admitting: *Deleted

## 2021-01-22 NOTE — Telephone Encounter (Signed)
Returned patient's phone call, spoke with patient 

## 2021-01-24 ENCOUNTER — Other Ambulatory Visit: Payer: Self-pay

## 2021-01-24 ENCOUNTER — Ambulatory Visit (INDEPENDENT_AMBULATORY_CARE_PROVIDER_SITE_OTHER): Payer: Medicare HMO

## 2021-01-24 ENCOUNTER — Telehealth: Payer: Self-pay

## 2021-01-24 DIAGNOSIS — Z23 Encounter for immunization: Secondary | ICD-10-CM | POA: Diagnosis not present

## 2021-01-24 NOTE — Telephone Encounter (Signed)
Patient notified -per Ashlyn Bruning PA-C that "he should not do any strenuous activity where he is putting any strain on his abdominal muscles for the first 2-3 weeks after implant (no lifting, pushing or pulling more than 15lbs).  He can get on the riding lawn mower 2 weeks after implant."

## 2021-01-29 DIAGNOSIS — C61 Malignant neoplasm of prostate: Secondary | ICD-10-CM | POA: Diagnosis not present

## 2021-01-30 DIAGNOSIS — G43001 Migraine without aura, not intractable, with status migrainosus: Secondary | ICD-10-CM | POA: Diagnosis not present

## 2021-01-30 DIAGNOSIS — M5033 Other cervical disc degeneration, cervicothoracic region: Secondary | ICD-10-CM | POA: Diagnosis not present

## 2021-01-30 DIAGNOSIS — M9901 Segmental and somatic dysfunction of cervical region: Secondary | ICD-10-CM | POA: Diagnosis not present

## 2021-01-30 DIAGNOSIS — M9902 Segmental and somatic dysfunction of thoracic region: Secondary | ICD-10-CM | POA: Diagnosis not present

## 2021-02-01 DIAGNOSIS — H401131 Primary open-angle glaucoma, bilateral, mild stage: Secondary | ICD-10-CM | POA: Diagnosis not present

## 2021-02-06 ENCOUNTER — Telehealth: Payer: Self-pay | Admitting: *Deleted

## 2021-02-06 NOTE — Telephone Encounter (Signed)
CALLED PATIENT TO REMIND OF POST SEED APPTS. FOR 02-07-21, SPOKE WITH PATIENT AND HE IS AWARE OF THESE APPTS.

## 2021-02-07 ENCOUNTER — Other Ambulatory Visit: Payer: Self-pay

## 2021-02-07 ENCOUNTER — Ambulatory Visit
Admission: RE | Admit: 2021-02-07 | Discharge: 2021-02-07 | Disposition: A | Discharge status: Home / Self Care | Payer: Medicare HMO | Source: Ambulatory Visit | Attending: Radiation Oncology | Admitting: Radiation Oncology

## 2021-02-07 ENCOUNTER — Ambulatory Visit
Admission: RE | Admit: 2021-02-07 | Discharge: 2021-02-07 | Disposition: A | Discharge status: Home / Self Care | Payer: Medicare HMO | Source: Ambulatory Visit | Attending: Urology | Admitting: Urology

## 2021-02-07 ENCOUNTER — Encounter: Payer: Self-pay | Admitting: General Practice

## 2021-02-07 ENCOUNTER — Encounter: Payer: Self-pay | Admitting: Urology

## 2021-02-07 VITALS — BP 155/76 | HR 75 | Temp 97.8°F | Resp 20 | Ht 70.0 in | Wt 215.8 lb

## 2021-02-07 DIAGNOSIS — C61 Malignant neoplasm of prostate: Secondary | ICD-10-CM | POA: Insufficient documentation

## 2021-02-07 NOTE — Progress Notes (Signed)
  Radiation Oncology         [phone]) [phone] ________________________________  Name: Todd Patterson MRN: [id number]  Date: 02/07/2021  DOB: 1950-07-29  COMPLEX SIMULATION NOTE  NARRATIVE:  The patient was brought to the [facility] today following prostate seed implantation approximately one month ago.  Identity was confirmed.  All relevant records and images related to the planned course of therapy were reviewed.  Then, the patient was set-up supine.  CT images were obtained.  The CT images were loaded into the planning software.  Then the prostate and rectum were contoured.  Treatment planning then occurred.  The implanted iodine 125 seeds were identified by the physics staff for projection of radiation distribution  I have requested : 3D Simulation  I have requested a DVH of the following structures: Prostate and rectum.    ________________________________  Sheral Apley Tammi Klippel, M.D.

## 2021-02-07 NOTE — Progress Notes (Signed)
[place] Psychosocial Distress Screening Clinical Social Work  Clinical Social Work was referred by distress screening protocol.  The patient scored a 8 on the Psychosocial Distress Thermometer which indicates moderate distress. Clinical Social Worker contacted patient by phone to assess for distress and other psychosocial needs. Brief call with patient - he is comfortable with all treatment plans, confident in treatment team, no psychosocial concerns.  Briefly described Rafael Gonzalez and availability of resources for him and his family members.  Encouraged him to call as needed.    ONCBCN DISTRESS SCREENING 02/07/2021  Screening Type   Distress experienced in past week (1-10) 8  Family Problem type   Emotional problem type Nervousness/Anxiety;Adjusting to illness  Physical Problem type   Physician notified of physical symptoms   Referral to clinical psychology   Referral to clinical social work   Referral to dietition   Referral to financial advocate   Referral to support programs   Referral to palliative care     Clinical Social Worker follow up needed: No.  If yes, follow up plan:  Beverely Pace, [place], LCSW Clinical Social Worker Phone:  [phone]

## 2021-02-07 NOTE — Progress Notes (Signed)
Patient reports mild dysuria and no other symptoms reported at this time.  I-PSS Score of 10 (moderate). Meaningful use complete.  Currently no urinary management medications. Urology follow-up scheduled for February, 2023, per patient.  BP (!) 155/76 (BP Location: Right Arm, Patient Position: Sitting, Cuff Size: Large)   Pulse 75   Temp 97.8 F (36.6 C)   Resp 20   Ht 5\' 10"  (1.778 m)   Wt 215 lb 12.8 oz (97.9 kg)   SpO2 98%   BMI 30.96 kg/m

## 2021-02-07 NOTE — Progress Notes (Signed)
Radiation Oncology         [phone]) [phone] ________________________________  Name: Todd Patterson MRN: [id number]  Date: 02/07/2021  DOB: 1950-12-10  Post-Seed Follow-Up Visit Note  CC: Todd Frizzle, MD  Todd Frizzle, MD  Diagnosis:   70 y.o. gentleman with Stage T1c adenocarcinoma of the prostate with Gleason score of 3+3, and PSA of 8.82.    ICD-10-CM   1. Malignant neoplasm of prostate ([place])  Cec[facility] Ambulatory referral to Social Work      Interval Since Last Radiation:  3.5 weeks 01/15/21:  Insertion of radioactive I-125 seeds into the prostate gland; 145 Gy, definitive therapy with placement of SpaceOAR gel.  Narrative:  The patient returns today for routine follow-up.  He is complaining of increased urinary frequency and urinary hesitation symptoms. He filled out a questionnaire regarding urinary function today providing and overall IPSS score of 10 characterizing his symptoms as mild-moderate with nocturia x3, frequency and weakened flow of stream.  He specifically denies dysuria, gross hematuria, straining to void, incomplete bladder emptying or incontinence.  His pre-implant score was 5.  He reports LUTS are gradually improving and manageable at this point without medication.  He denies any abdominal pain or bowel symptoms.  He reports a healthy appetite and is maintaining his weight.  He has not noticed any significant impact on his energy level and overall, is quite pleased with his progress to date.  ALLERGIES:  is allergic to lisinopril.  Meds: Current Outpatient Medications  Medication Sig Dispense Refill   atorvastatin (LIPITOR) 80 MG tablet TAKE 1 TABLET BY MOUTH EVERY DAY (Patient taking differently: Take 80 mg by mouth daily.) 90 tablet 1   carvedilol (COREG) 6.25 MG tablet TAKE 1 TABLET BY MOUTH TWICE A DAY (Patient taking differently: Take 6.25 mg by mouth 2 (two) times daily with a meal.) 180 tablet 1   clopidogrel (PLAVIX) 75 MG tablet TAKE 1 TABLET (75 MG  TOTAL) BY MOUTH DAILY. PLEASE REQUEST FUTURE REFILLS FROM PCP. (Patient taking differently: Take 75 mg by mouth daily. Managed by PCP.) 90 tablet 1   docusate sodium (COLACE) 100 MG capsule Take 1 capsule (100 mg total) by mouth daily as needed for up to 30 doses. 30 capsule 0   ibuprofen (ADVIL) 200 MG tablet Take 200 mg by mouth every 6 (six) hours as needed.     latanoprost (XALATAN) 0.005 % ophthalmic solution Place 1 drop into both eyes at bedtime.     losartan (COZAAR) 25 MG tablet TAKE 2 TABLETS BY MOUTH EVERY DAY (Patient taking differently: Take 25 mg by mouth 2 (two) times daily.) 180 tablet 3   oxyCODONE-acetaminophen (PERCOCET) 5-325 MG tablet Take 1 tablet by mouth every 4 (four) hours as needed for up to 18 doses for severe pain. 18 tablet 0   timolol (TIMOPTIC) 0.5 % ophthalmic solution Place 1 drop into both eyes 2 (two) times daily.     No current facility-administered medications for this encounter.    Physical Findings: In general this is a well appearing Caucasian male in no acute distress. He's alert and oriented x4 and appropriate throughout the examination. Cardiopulmonary assessment is negative for acute distress and he exhibits normal effort.   Lab Findings: Lab Results  Component Value Date   WBC 11.6 (H) 01/11/2021   HGB 16.1 01/11/2021   HCT 47.8 01/11/2021   MCV 91.0 01/11/2021   PLT 260 01/11/2021    Radiographic Findings:  Patient underwent CT imaging in our clinic  for post implant dosimetry. The CT will be reviewed by Dr. Tammi Klippel to confirm there is an adequate distribution of radioactive seeds throughout the prostate gland and ensure that there are no seeds in or near the rectum.  We suspect the final radiation plan and dosimetry will show appropriate coverage of the prostate gland. He understands that we will call and inform him of any unexpected findings on further review of his imaging and dosimetry.  Impression/Plan: 70 y.o. gentleman with Stage T1c  adenocarcinoma of the prostate with Gleason score of 3+3, and PSA of 8.82. The patient is recovering from the effects of radiation. His urinary symptoms should gradually improve over the next 4-6 months. We talked about this today. He is encouraged by his improvement already and is otherwise pleased with his outcome. We also talked about long-term follow-up for prostate cancer following seed implant. He understands that ongoing PSA determinations and digital rectal exams will help perform surveillance to rule out disease recurrence. He was seen by Jiles Crocker, NP on 01/29/2021 and has a follow up appointment scheduled with Dr. Abner Greenspan in February 2023, following his lab visit scheduled on 04/26/2021. He understands what to expect with his PSA measures. Patient was also educated today about some of the long-term effects from radiation including a small risk for rectal bleeding and possibly erectile dysfunction. We talked about some of the general management approaches to these potential complications. However, I did encourage the patient to contact our office or return at any point if he has questions or concerns related to his previous radiation and prostate cancer.    Nicholos Johns, PA-C

## 2021-02-14 ENCOUNTER — Other Ambulatory Visit: Payer: Self-pay | Admitting: Family Medicine

## 2021-02-15 ENCOUNTER — Telehealth: Payer: Self-pay

## 2021-02-15 ENCOUNTER — Ambulatory Visit (INDEPENDENT_AMBULATORY_CARE_PROVIDER_SITE_OTHER): Payer: Medicare HMO | Admitting: Pharmacist

## 2021-02-15 DIAGNOSIS — I10 Essential (primary) hypertension: Secondary | ICD-10-CM

## 2021-02-15 DIAGNOSIS — E785 Hyperlipidemia, unspecified: Secondary | ICD-10-CM

## 2021-02-15 NOTE — Progress Notes (Deleted)
Chronic Care Management Pharmacy Note  02/15/2021 Name:  Todd Patterson MRN:  [id number] DOB:  1950-06-12  Subjective: Todd Patterson is an 70 y.o. year old male who is a primary patient of Pickard, Cammie Mcgee, MD.  The CCM team was consulted for assistance with disease management and care coordination needs.    Engaged with patient by telephone for follow up visit in response to provider referral for pharmacy case management and/or care coordination services.   Consent to Services:  The patient was given the following information about Chronic Care Management services today, agreed to services, and gave verbal consent: 1. CCM service includes personalized support from designated clinical staff supervised by the primary care provider, including individualized plan of care and coordination with other care providers 2. 24/7 contact phone numbers for assistance for urgent and routine care needs. 3. Service will only be billed when office clinical staff spend 20 minutes or more in a month to coordinate care. 4. Only one practitioner may furnish and bill the service in a calendar month. 5.The patient may stop CCM services at any time (effective at the end of the month) by phone call to the office staff. 6. The patient will be responsible for cost sharing (co-pay) of up to 20% of the service fee (after annual deductible is met). Patient agreed to services and consent obtained.  Patient Care Team: [name], MD as PCP - General (Family Medicine) Danice Goltz, MD as Consulting Physician (Ophthalmology) Edythe Clarity, [facility] as Pharmacist (Pharmacist)  Recent office visits:  None since 10/20/20   Recent consult visits:  11/08/20 Chiropractic Medicine [facility] Pert No information given. 11/02/20 Urology [place], [place] No information Given.   Hospital visits:  None since 10/20/20  Objective:  Lab Results  Component Value Date   CREATININE 0.70 01/11/2021   BUN 14 01/11/2021    GFRNONAA >60 01/11/2021   GFRAA 109 12/16/2019   NA 139 01/11/2021   K 4.2 01/11/2021   CALCIUM 9.1 01/11/2021   CO2 26 01/11/2021   GLUCOSE 101 (H) 01/11/2021    Lab Results  Component Value Date/Time   HGBA1C 5.6 09/02/2019 04:55 PM    Last diabetic Eye exam: No results found for: HMDIABEYEEXA  Last diabetic Foot exam: No results found for: HMDIABFOOTEX   Lab Results  Component Value Date   CHOL 174 12/16/2019   HDL 67 12/16/2019   LDLCALC 84 12/16/2019   TRIG 134 12/16/2019   CHOLHDL 2.6 12/16/2019    Hepatic Function Latest Ref Rng & Units 01/11/2021 12/16/2019 07/08/2019  Total Protein 6.5 - 8.1 g/dL 7.2 6.6 7.0  Albumin 3.5 - 5.0 g/dL 4.0 - 4.4  AST 15 - 41 U/L 17 16 19   ALT 0 - 44 U/L 22 23 26   Alk Phosphatase 38 - 126 U/L 55 - 70  Total Bilirubin 0.3 - 1.2 mg/dL 0.6 0.5 0.6  Bilirubin, Direct 0.00 - 0.40 mg/dL - - 0.19    Lab Results  Component Value Date/Time   TSH 2.820 09/02/2019 04:55 PM   TSH 1.12 11/18/2018 08:04 AM    CBC Latest Ref Rng & Units 01/11/2021 12/16/2019 11/30/2018  WBC 4.0 - 10.5 K/uL 11.6(H) 11.3(H) 10.9(H)  Hemoglobin 13.0 - 17.0 g/dL 16.1 15.4 15.4  Hematocrit 39.0 - 52.0 % 47.8 45.2 45.7  Platelets 150 - 400 K/uL 260 276 251    No results found for: VD25OH  Clinical ASCVD: Yes  The ASCVD Risk score (Arnett DK, et  al., 2019) failed to calculate for the following reasons:   The patient has a prior MI or stroke diagnosis    Depression screen [facility] 2/9 08/17/2020 12/01/2017 12/31/2016  Decreased Interest 0 0 0  Down, Depressed, Hopeless 0 0 0  PHQ - 2 Score 0 0 0     Social History   Tobacco Use  Smoking Status Former   Packs/day: 2.00   Years: 42.00   Pack years: 84.00   Types: Cigarettes   Quit date: 2010   Years since quitting: 12.9  Smokeless Tobacco Never   BP Readings from Last 3 Encounters:  02/07/21 (!) 155/76  01/15/21 (!) 165/83  01/11/21 (!) 172/85   Pulse Readings from Last 3 Encounters:  02/07/21 75   01/15/21 62  01/11/21 69   Wt Readings from Last 3 Encounters:  02/07/21 215 lb 12.8 oz (97.9 kg)  01/15/21 215 lb (97.5 kg)  10/12/20 219 lb 9.6 oz (99.6 kg)   BMI Readings from Last 3 Encounters:  02/07/21 30.96 kg/m  01/15/21 30.85 kg/m  01/11/21 30.13 kg/m    Assessment/Interventions: Review of patient past medical history, allergies, medications, health status, including review of consultants reports, laboratory and other test data, was performed as part of comprehensive evaluation and provision of chronic care management services.   SDOH:  (Social Determinants of Health) assessments and interventions performed: No   Financial Resource Strain: Low Risk    Difficulty of Paying Living Expenses: Not hard at all    SDOH Screenings   Alcohol Screen: Low Risk    Last Alcohol Screening Score (AUDIT): 5  Depression (PHQ2-9): Low Risk    PHQ-2 Score: 0  Financial Resource Strain: Low Risk    Difficulty of Paying Living Expenses: Not hard at all  Food Insecurity: No Food Insecurity   Worried About Charity fundraiser in the Last Year: Never true   Ran Out of Food in the Last Year: Never true  Housing: Low Risk    Last Housing Risk Score: 0  Physical Activity: Inactive   Days of Exercise per Week: 0 days   Minutes of Exercise per Session: 0 min  Social Connections: Moderately Integrated   Frequency of Communication with Friends and Family: More than three times a week   Frequency of Social Gatherings with Friends and Family: More than three times a week   Attends Religious Services: Never   Marine scientist or Organizations: Yes   Attends Music therapist: More than 4 times per year   Marital Status: Married  Stress: No Stress Concern Present   Feeling of Stress : Not at all  Tobacco Use: Medium Risk   Smoking Tobacco Use: Former   Smokeless Tobacco Use: Never   Passive Exposure: Not on Pensions consultant Needs: No Transportation Needs   Lack of  Transportation (Medical): No   Lack of Transportation (Non-Medical): No    CCM Care Plan  Allergies  Allergen Reactions   Lisinopril Cough    Medications Reviewed Today     Reviewed by Tyler Pita, MD (Physician) on 02/10/21 at 1336  Med List Status: <None>   Medication Order Taking? Sig Documenting Provider Last Dose Status Informant  atorvastatin (LIPITOR) 80 MG tablet [id number]  TAKE 1 TABLET BY MOUTH EVERY DAY  Patient taking differently: Take 80 mg by mouth daily.   [name], MD  Active Self  carvedilol (COREG) 6.25 MG tablet [id number]  TAKE 1 TABLET BY MOUTH TWICE A  DAY  Patient taking differently: Take 6.25 mg by mouth 2 (two) times daily with a meal.   [name], MD  Active Self  clopidogrel (PLAVIX) 75 MG tablet [id number]  TAKE 1 TABLET (75 MG TOTAL) BY MOUTH DAILY. PLEASE REQUEST FUTURE REFILLS FROM PCP.  Patient taking differently: Take 75 mg by mouth daily. Managed by PCP.   [name], MD  Active Self  docusate sodium (COLACE) 100 MG capsule [id number]  Take 1 capsule (100 mg total) by mouth daily as needed for up to 30 doses. Janith Lima, MD  Active   ibuprofen (ADVIL) 200 MG tablet [id number]  Take 200 mg by mouth every 6 (six) hours as needed. [provider]  Active Self  latanoprost (XALATAN) 0.005 % ophthalmic solution [id number]  Place 1 drop into both eyes at bedtime. [provider]  Active Self  losartan (COZAAR) 25 MG tablet [id number]  TAKE 2 TABLETS BY MOUTH EVERY DAY  Patient taking differently: Take 25 mg by mouth 2 (two) times daily.   [name], MD  Active Self  oxyCODONE-acetaminophen (PERCOCET) 5-325 MG tablet [id number]  Take 1 tablet by mouth every 4 (four) hours as needed for up to 18 doses for severe pain. Janith Lima, MD  Active   timolol (TIMOPTIC) 0.5 % ophthalmic solution [id number]  Place 1 drop into both eyes 2 (two) times daily. 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   Primary hyperparathyroidism ([place]) 07/04/2016   Hyperlipidemia 04/26/2016   Essential hypertension 04/26/2016   CAD (coronary artery disease)     Immunization History  Administered Date(s) Administered   Fluad Quad(high Dose 65+) 11/18/2018, 12/16/2019, 01/24/2021   Influenza,inj,Quad PF,6+ Mos 02/06/2016, 12/31/2016, 12/01/2017   PFIZER(Purple Top)SARS-COV-2 Vaccination 04/23/2019, 05/14/2019   Pneumococcal Conjugate-13 01/16/2017   Pneumococcal Polysaccharide-23 02/06/2016    Conditions to be addressed/monitored:  CAD, HTN, Hyperlipidemia, CVA  There are no care plans that you recently modified to display for this patient.    Medication Assistance: None required.  Patient affirms current coverage meets needs.  Patient's preferred pharmacy is:  [id number] [place], [place]: [phone]: [phone] [id number] [place], [place] [address]: [phone]: [phone] endorses 100% compliance  We discussed: Benefits of medication synchronization, packaging and delivery as well as enhanced pharmacist oversight with [employer]. Patient decided to: Continue current medication management strategy  Care Plan and Follow Up Patient Decision:  Patient agrees to Care Plan and Follow-up.  Plan: The care management team will reach out to the patient again over the next 180 days.  ChBeverly MilchPharmD Clinical Pharmacist [facility] ([phone]  Current Barriers:  No specific barriers to medication at this time.  Pharmacist Clinical Goal(s):  Patient will achieve adherence to monitoring  guidelines and medication adherence to achieve therapeutic efficacy maintain control of BP as evidenced by home monitoring  contact provider office for questions/concerns as evidenced notation of same in electronic health record through collaboration with PharmD and provider.   Interventions: 1:1 collaboration with PiSusy Frizzle  MD regarding development and update of comprehensive plan of care as evidenced by provider attestation and co-signature Inter-disciplinary care team collaboration (see longitudinal plan of care) Comprehensive medication review performed; medication list updated in electronic medical record  Hypertension (BP goal <140/90) -Controlled -Current treatment: Carvedilol 6.22m BID Losartan 213mtwo tablets daily -Medications previously tried: none noted  -Current home readings: patient was at work so could -Current exercise habits: patient reports he is walking outside as the weather starts to warm up. -Denies hypotensive/hypertensive symptoms -Educated on BP goals and benefits of medications for prevention of heart attack, stroke and kidney damage; Exercise goal of 150 minutes per week; Importance of home blood pressure monitoring;  -patient reports he is working on losing some weight -Counseled to monitor BP at home daily, document, and provide log at future appointments -Recommended to continue current medication   Hyperlipidemia/CAD/Hx of CVA: (LDL goal < 70) -Not ideally controlled -Current treatment: Atorvastatin 8081maily Clopidogrel 91m53mily -Medications previously tried: simvastatin -Current exercise habits: see above -Educated on Cholesterol goals;  Benefits of statin for ASCVD risk reduction; Importance of limiting foods high in cholesterol; Exercise goal of 150 minutes per week;  -Reviewed most recent lipid panel, goal < 70 with hx of CVA -Recommended to continue current medication Recommended repeat lipid panel, would consider addition of  Zetia if LDL still elevated.  Patient is working on exercise/diet to lose weigh.   Patient Goals/Self-Care Activities Patient will:  - take medications as prescribed focus on medication adherence by pill count check blood pressure daily, document, and provide at future appointments  Follow Up Plan: The care management team will reach out to the patient again over the next 180 days.

## 2021-02-16 ENCOUNTER — Other Ambulatory Visit: Payer: Self-pay

## 2021-02-16 NOTE — Progress Notes (Signed)
Chronic Care Management Pharmacy Note  02/15/2021 Name:  Todd Patterson MRN:  [id number] DOB:  [birth year]  Subjective: Todd Patterson is an 70 y.o. year old male who is a primary patient of Pickard, Cammie Mcgee, MD.  The CCM team was consulted for assistance with disease management and care coordination needs.    Engaged with patient by telephone for follow up visit in response to provider referral for pharmacy case management and/or care coordination services.   Consent to Services:  The patient was given the following information about Chronic Care Management services today, agreed to services, and gave verbal consent: 1. CCM service includes personalized support from designated clinical staff supervised by the primary care provider, including individualized plan of care and coordination with other care providers 2. 24/7 contact phone numbers for assistance for urgent and routine care needs. 3. Service will only be billed when office clinical staff spend 20 minutes or more in a month to coordinate care. 4. Only one practitioner may furnish and bill the service in a calendar month. 5.The patient may stop CCM services at any time (effective at the end of the month) by phone call to the office staff. 6. The patient will be responsible for cost sharing (co-pay) of up to 20% of the service fee (after annual deductible is met). Patient agreed to services and consent obtained.  Patient Care Team: [name], MD as PCP - General (Family Medicine) Danice Goltz, MD as Consulting Physician (Ophthalmology) Edythe Clarity, [facility] as Pharmacist (Pharmacist)  Recent office visits:  None since 10/20/20   Recent consult visits:  11/08/20 Chiropractic Medicine Iris Pert No information given. 11/02/20 Urology [place], [place] No information Given.   Hospital visits:  None since 10/20/20  Objective:  Lab Results  Component Value Date   CREATININE 0.70 01/11/2021   BUN 14 01/11/2021    GFRNONAA >60 01/11/2021   GFRAA 109 12/16/2019   NA 139 01/11/2021   K 4.2 01/11/2021   CALCIUM 9.1 01/11/2021   CO2 26 01/11/2021   GLUCOSE 101 (H) 01/11/2021    Lab Results  Component Value Date/Time   HGBA1C 5.6 09/02/2019 04:55 PM    Last diabetic Eye exam: No results found for: HMDIABEYEEXA  Last diabetic Foot exam: No results found for: HMDIABFOOTEX   Lab Results  Component Value Date   CHOL 174 12/16/2019   HDL 67 12/16/2019   LDLCALC 84 12/16/2019   TRIG 134 12/16/2019   CHOLHDL 2.6 12/16/2019    Hepatic Function Latest Ref Rng & Units 01/11/2021 12/16/2019 07/08/2019  Total Protein 6.5 - 8.1 g/dL 7.2 6.6 7.0  Albumin 3.5 - 5.0 g/dL 4.0 - 4.4  AST 15 - 41 U/L 17 16 19   ALT 0 - 44 U/L 22 23 26   Alk Phosphatase 38 - 126 U/L 55 - 70  Total Bilirubin 0.3 - 1.2 mg/dL 0.6 0.5 0.6  Bilirubin, Direct 0.00 - 0.40 mg/dL - - 0.19    Lab Results  Component Value Date/Time   TSH 2.820 09/02/2019 04:55 PM   TSH 1.12 11/18/2018 08:04 AM    CBC Latest Ref Rng & Units 01/11/2021 12/16/2019 11/30/2018  WBC 4.0 - 10.5 K/uL 11.6(H) 11.3(H) 10.9(H)  Hemoglobin 13.0 - 17.0 g/dL 16.1 15.4 15.4  Hematocrit 39.0 - 52.0 % 47.8 45.2 45.7  Platelets 150 - 400 K/uL 260 276 251    No results found for: VD25OH  Clinical ASCVD: Yes  The ASCVD Risk score (Arnett DK, et  al., 2019) failed to calculate for the following reasons:   The patient has a prior MI or stroke diagnosis    Depression screen [facility] 2/9 08/17/2020 12/01/2017 12/31/2016  Decreased Interest 0 0 0  Down, Depressed, Hopeless 0 0 0  PHQ - 2 Score 0 0 0     Social History   Tobacco Use  Smoking Status Former   Packs/day: 2.00   Years: 42.00   Pack years: 84.00   Types: Cigarettes   Quit date: 2010   Years since quitting: 12.9  Smokeless Tobacco Never   BP Readings from Last 3 Encounters:  02/07/21 (!) 155/76  01/15/21 (!) 165/83  01/11/21 (!) 172/85   Pulse Readings from Last 3 Encounters:  02/07/21 75   01/15/21 62  01/11/21 69   Wt Readings from Last 3 Encounters:  02/07/21 215 lb 12.8 oz (97.9 kg)  01/15/21 215 lb (97.5 kg)  10/12/20 219 lb 9.6 oz (99.6 kg)   BMI Readings from Last 3 Encounters:  02/07/21 30.96 kg/m  01/15/21 30.85 kg/m  01/11/21 30.13 kg/m    Assessment/Interventions: Review of patient past medical history, allergies, medications, health status, including review of consultants reports, laboratory and other test data, was performed as part of comprehensive evaluation and provision of chronic care management services.   SDOH:  (Social Determinants of Health) assessments and interventions performed: No   Financial Resource Strain: Low Risk    Difficulty of Paying Living Expenses: Not hard at all    SDOH Screenings   Alcohol Screen: Low Risk    Last Alcohol Screening Score (AUDIT): 5  Depression (PHQ2-9): Low Risk    PHQ-2 Score: 0  Financial Resource Strain: Low Risk    Difficulty of Paying Living Expenses: Not hard at all  Food Insecurity: No Food Insecurity   Worried About Charity fundraiser in the Last Year: Never true   Ran Out of Food in the Last Year: Never true  Housing: Low Risk    Last Housing Risk Score: 0  Physical Activity: Inactive   Days of Exercise per Week: 0 days   Minutes of Exercise per Session: 0 min  Social Connections: Moderately Integrated   Frequency of Communication with Friends and Family: More than three times a week   Frequency of Social Gatherings with Friends and Family: More than three times a week   Attends Religious Services: Never   Marine scientist or Organizations: Yes   Attends Music therapist: More than 4 times per year   Marital Status: Married  Stress: No Stress Concern Present   Feeling of Stress : Not at all  Tobacco Use: Medium Risk   Smoking Tobacco Use: Former   Smokeless Tobacco Use: Never   Passive Exposure: Not on Pensions consultant Needs: No Transportation Needs   Lack of  Transportation (Medical): No   Lack of Transportation (Non-Medical): No    CCM Care Plan  Allergies  Allergen Reactions   Lisinopril Cough    Medications Reviewed Today     Reviewed by Tyler Pita, MD (Physician) on 02/10/21 at 1336  Med List Status: <None>   Medication Order Taking? Sig Documenting Provider Last Dose Status Informant  atorvastatin (LIPITOR) 80 MG tablet [id number]  TAKE 1 TABLET BY MOUTH EVERY DAY  Patient taking differently: Take 80 mg by mouth daily.   [name], MD  Active Self  carvedilol (COREG) 6.25 MG tablet [id number]  TAKE 1 TABLET BY MOUTH TWICE A  DAY  Patient taking differently: Take 6.25 mg by mouth 2 (two) times daily with a meal.   [name], MD  Active Self  clopidogrel (PLAVIX) 75 MG tablet [id number]  TAKE 1 TABLET (75 MG TOTAL) BY MOUTH DAILY. PLEASE REQUEST FUTURE REFILLS FROM PCP.  Patient taking differently: Take 75 mg by mouth daily. Managed by PCP.   [name], MD  Active Self  docusate sodium (COLACE) 100 MG capsule [id number]  Take 1 capsule (100 mg total) by mouth daily as needed for up to 30 doses. Janith Lima, MD  Active   ibuprofen (ADVIL) 200 MG tablet [id number]  Take 200 mg by mouth every 6 (six) hours as needed. [provider]  Active Self  latanoprost (XALATAN) 0.005 % ophthalmic solution [id number]  Place 1 drop into both eyes at bedtime. [provider]  Active Self  losartan (COZAAR) 25 MG tablet [id number]  TAKE 2 TABLETS BY MOUTH EVERY DAY  Patient taking differently: Take 25 mg by mouth 2 (two) times daily.   [name], MD  Active Self  oxyCODONE-acetaminophen (PERCOCET) 5-325 MG tablet [id number]  Take 1 tablet by mouth every 4 (four) hours as needed for up to 18 doses for severe pain. Janith Lima, MD  Active   timolol (TIMOPTIC) 0.5 % ophthalmic solution [id number]  Place 1 drop into both eyes 2 (two) times daily. [provider]  Active Self             Patient Active Problem List   Diagnosis Date Noted   Malignant neoplasm of prostate ([place]) 10/12/2020   Cerebrovascular accident (CVA) ([place]) 09/02/2019   VI nerve palsy, right 09/22/2017   Primary hyperparathyroidism ([place]) 07/04/2016   Hyperlipidemia 04/26/2016   Essential hypertension 04/26/2016   CAD (coronary artery disease)     Immunization History  Administered Date(s) Administered   Fluad Quad(high Dose 65+) 11/18/2018, 12/16/2019, 01/24/2021   Influenza,inj,Quad PF,6+ Mos 02/06/2016, 12/31/2016, 12/01/2017   PFIZER(Purple Top)SARS-COV-2 Vaccination 04/23/2019, 05/14/2019   Pneumococcal Conjugate-13 01/16/2017   Pneumococcal Polysaccharide-23 02/06/2016    Conditions to be addressed/monitored:  CAD, HTN, Hyperlipidemia, CVA  There are no care plans that you recently modified to display for this patient.    Medication Assistance: None required.  Patient affirms current coverage meets needs.  Patient's preferred pharmacy is:  [id number] [place] [address]Phone: [phone]: [phone] [id number] [place], [place] [address]: [phone]: [phone] endorses 100% compliance  We discussed: Benefits of medication synchronization, packaging and delivery as well as enhanced pharmacist oversight with [employer]. Patient decided to: Continue current medication management strategy  Care Plan and Follow Up Patient Decision:  Patient agrees to Care Plan and Follow-up.  Plan: The care management team will reach out to the patient again over the next 180 days.  ChBeverly MilchPharmD Clinical Pharmacist [phone]

## 2021-02-16 NOTE — Patient Instructions (Addendum)
Visit Information   Goals Addressed             This Visit's Progress    Track and Manage My Blood Pressure-Hypertension   On track    Timeframe:  Long-Range Goal Priority:  High Start Date:   07/10/20                          Expected End Date:  01/09/21                     Follow Up Date 10/09/20   - check blood pressure 3 times per week - choose a place to take my blood pressure (home, clinic or office, retail store) - write blood pressure results in a log or diary    Why is this important?   You won't feel high blood pressure, but it can still hurt your blood vessels.  High blood pressure can cause heart or kidney problems. It can also cause a stroke.  Making lifestyle changes like losing a little weight or eating less salt will help.  Checking your blood pressure at home and at different times of the day can help to control blood pressure.  If the doctor prescribes medicine remember to take it the way the doctor ordered.  Call the office if you cannot afford the medicine or if there are questions about it.     Notes:        Patient Care Plan: General Pharmacy (Adult)     Problem Identified: HTN, HLD, CVA   Priority: High  Onset Date: 07/10/2020     Long-Range Goal: Patient-Specific Goal   Start Date: 07/12/2020  Expected End Date: 01/11/2021  Recent Progress: On track  Priority: High  Note:   Current Barriers:  No specific barriers to medication at this time.  Pharmacist Clinical Goal(s):  Patient will achieve adherence to monitoring guidelines and medication adherence to achieve therapeutic efficacy maintain control of BP as evidenced by home monitoring  contact provider office for questions/concerns as evidenced notation of same in electronic health record through collaboration with PharmD and provider.   Interventions: 1:1 collaboration with [name], MD regarding development and update of comprehensive plan of care as evidenced by provider  attestation and co-signature Inter-disciplinary care team collaboration (see longitudinal plan of care) Comprehensive medication review performed; medication list updated in electronic medical record  Hypertension (BP goal <140/90) -Controlled -Current treatment: Carvedilol 6.25mg  BID Losartan 25mg  two tablets daily -Medications previously tried: none noted  -Current home readings: patient was at work so could -Current exercise habits: patient reports he is walking outside as the weather starts to warm up. -Denies hypotensive/hypertensive symptoms -Educated on BP goals and benefits of medications for prevention of heart attack, stroke and kidney damage; Exercise goal of 150 minutes per week; Importance of home blood pressure monitoring;  -patient reports he is working on losing some weight -Counseled to monitor BP at home daily, document, and provide log at future appointments -Recommended to continue current medication  Update 02/15/21 Pt states BP is fluctuating up and down.  Mostly systolic has been in the 956L lately.  He continues to work on losing weight.  Has not checked at home in the past few weeks.  Will have him check it at least once or twice weekly and follow up. If BP remains elevated, future plans would be to increase to Losartan 100mg  daily. Will FU towards end of month to  assess.   Hyperlipidemia/CAD/Hx of CVA: (LDL goal < 70) -Not ideally controlled -Current treatment: Atorvastatin 80mg  daily Clopidogrel 75mg  daily -Medications previously tried: simvastatin -Current exercise habits: see above -Educated on Cholesterol goals;  Benefits of statin for ASCVD risk reduction; Importance of limiting foods high in cholesterol; Exercise goal of 150 minutes per week;  -Reviewed most recent lipid panel, goal < 70 with hx of CVA -Recommended to continue current medication Recommended repeat lipid panel, would consider addition of Zetia if LDL still elevated.  Patient is  working on exercise/diet to lose weigh.  Update 02/15/21 Continues same dose of statin, has not had updated lipid panel since September 2021.  Recommend updated lipid panel. If LDL still elevated would consider addition of Zetia. Encouraged patient to make appointment for updated lab work.  He is getting some labs with urology, however they are not doing routine labs.   Patient Goals/Self-Care Activities Patient will:  - take medications as prescribed focus on medication adherence by pill count check blood pressure daily, document, and provide at future appointments  Follow Up Plan: The care management team will reach out to the patient again over the next 180 days.           Patient verbalizes understanding of instructions provided today and agrees to view in [place].  Telephone follow up appointment with pharmacy team member scheduled for: 4 months  Edythe Clarity, [place]

## 2021-02-21 ENCOUNTER — Encounter: Payer: Self-pay | Admitting: Radiation Oncology

## 2021-02-21 ENCOUNTER — Ambulatory Visit
Admission: RE | Admit: 2021-02-21 | Discharge: 2021-02-21 | Disposition: A | Discharge status: Home / Self Care | Payer: Medicare HMO | Source: Ambulatory Visit | Attending: Radiation Oncology | Admitting: Radiation Oncology

## 2021-02-21 DIAGNOSIS — C61 Malignant neoplasm of prostate: Secondary | ICD-10-CM | POA: Insufficient documentation

## 2021-02-21 DIAGNOSIS — G43001 Migraine without aura, not intractable, with status migrainosus: Secondary | ICD-10-CM | POA: Diagnosis not present

## 2021-02-21 DIAGNOSIS — M9901 Segmental and somatic dysfunction of cervical region: Secondary | ICD-10-CM | POA: Diagnosis not present

## 2021-02-21 DIAGNOSIS — M5033 Other cervical disc degeneration, cervicothoracic region: Secondary | ICD-10-CM | POA: Diagnosis not present

## 2021-02-21 DIAGNOSIS — M9902 Segmental and somatic dysfunction of thoracic region: Secondary | ICD-10-CM | POA: Diagnosis not present

## 2021-02-28 ENCOUNTER — Telehealth: Payer: Self-pay | Admitting: *Deleted

## 2021-02-28 NOTE — Telephone Encounter (Signed)
RETURNED PATIENT'S PHONE CALL, SPOKE WITH PATIENT. ?

## 2021-03-07 ENCOUNTER — Telehealth: Payer: Self-pay | Admitting: Pharmacist

## 2021-03-07 NOTE — Chronic Care Management (AMB) (Signed)
Chronic Care Management Pharmacy Assistant   Name: Todd Patterson  MRN: [id number] DOB: Apr 21, 1950   Reason for Encounter: Hypertension Adherence Call    Recent office visits:  None  Recent consult visits:  None  Hospital visits:  None in previous 6 months  Medications: Outpatient Encounter Medications as of 03/07/2021  Medication Sig   atorvastatin (LIPITOR) 80 MG tablet TAKE 1 TABLET BY MOUTH EVERY DAY (Patient taking differently: Take 80 mg by mouth daily.)   carvedilol (COREG) 6.25 MG tablet Take 1 tablet (6.25 mg total) by mouth 2 (two) times daily with a meal.   clopidogrel (PLAVIX) 75 MG tablet TAKE 1 TABLET (75 MG TOTAL) BY MOUTH DAILY. PLEASE REQUEST FUTURE REFILLS FROM PCP. (Patient taking differently: Take 75 mg by mouth daily. Managed by PCP.)   docusate sodium (COLACE) 100 MG capsule Take 1 capsule (100 mg total) by mouth daily as needed for up to 30 doses.   ibuprofen (ADVIL) 200 MG tablet Take 200 mg by mouth every 6 (six) hours as needed.   latanoprost (XALATAN) 0.005 % ophthalmic solution Place 1 drop into both eyes at bedtime.   losartan (COZAAR) 25 MG tablet TAKE 2 TABLETS BY MOUTH EVERY DAY (Patient taking differently: Take 25 mg by mouth 2 (two) times daily.)   oxyCODONE-acetaminophen (PERCOCET) 5-325 MG tablet Take 1 tablet by mouth every 4 (four) hours as needed for up to 18 doses for severe pain.   timolol (TIMOPTIC) 0.5 % ophthalmic solution Place 1 drop into both eyes 2 (two) times daily.   No facility-administered encounter medications on file as of 03/07/2021.   Reviewed chart prior to disease state call. Spoke with patient regarding BP  Recent Office Vitals: BP Readings from Last 3 Encounters:  02/07/21 (!) 155/76  01/15/21 (!) 165/83  01/11/21 (!) 172/85   Pulse Readings from Last 3 Encounters:  02/07/21 75  01/15/21 62  01/11/21 69    Wt Readings from Last 3 Encounters:  02/07/21 215 lb 12.8 oz (97.9 kg)  01/15/21 215 lb (97.5 kg)   10/12/20 219 lb 9.6 oz (99.6 kg)     Kidney Function Lab Results  Component Value Date/Time   CREATININE 0.70 01/11/2021 09:06 AM   CREATININE 0.74 12/16/2019 08:15 AM   CREATININE 0.84 11/18/2018 08:04 AM   GFRNONAA >60 01/11/2021 09:06 AM   GFRNONAA 94 12/16/2019 08:15 AM   GFRAA 109 12/16/2019 08:15 AM    BMP Latest Ref Rng & Units 01/11/2021 12/16/2019 11/18/2018  Glucose 70 - 99 mg/dL 101(H) 91 90  BUN 8 - 23 mg/dL 14 14 15   Creatinine 0.61 - 1.24 mg/dL 0.70 0.74 0.84  BUN/Creat Ratio 6 - 22 (calc) - NOT APPLICABLE NOT APPLICABLE  Sodium 163 - 145 mmol/L 139 141 140  Potassium 3.5 - 5.1 mmol/L 4.2 4.7 4.7  Chloride 98 - 111 mmol/L 107 104 102  CO2 22 - 32 mmol/L 26 27 28   Calcium 8.9 - 10.3 mg/dL 9.1 9.8 9.5    Current antihypertensive regimen:  Carvedilol 6.25 mg twice daily Losartan 25 mg daily  How often are you checking your Blood Pressure? daily  Current home BP readings: 126/78 03/15/21  What recent interventions/DTPs have been made by any provider to improve Blood Pressure control since last CPP Visit: No recent intervention or DTPs.  Any recent hospitalizations or ED visits since last visit with CPP? No  What diet changes have been made to improve Blood Pressure Control?  He states he lost  9 lbs recently.  What exercise is being done to improve your Blood Pressure Control?  Plays golf when the weather is good.  Adherence Review: Is the patient currently on ACE/ARB medication? Yes Does the patient have >5 day gap between last estimated fill dates? No  Care Gaps: Medicare Annual Wellness: Completed 08/17/2020 Hemoglobin A1C: 5.6% on 09/02/2019 Colonoscopy: Completed 01/29/2017  Future Appointments  Date Time Provider [place]  06/21/2021 12:30 PM [facility] PHARMACIST [facility] None   Star Rating Drugs: Atorvastatin 80 mg last filled 01/29/2021 90 DS Losartan Potassium 25 mg last filled 01/28/2021 90 DS  April D Calhoun, [place]  Pharmacist Assistant [phone]

## 2021-03-14 DIAGNOSIS — M9902 Segmental and somatic dysfunction of thoracic region: Secondary | ICD-10-CM | POA: Diagnosis not present

## 2021-03-14 DIAGNOSIS — M5033 Other cervical disc degeneration, cervicothoracic region: Secondary | ICD-10-CM | POA: Diagnosis not present

## 2021-03-14 DIAGNOSIS — G43001 Migraine without aura, not intractable, with status migrainosus: Secondary | ICD-10-CM | POA: Diagnosis not present

## 2021-03-14 DIAGNOSIS — M9901 Segmental and somatic dysfunction of cervical region: Secondary | ICD-10-CM | POA: Diagnosis not present

## 2021-03-16 ENCOUNTER — Ambulatory Visit: Payer: Self-pay | Admitting: Pharmacist

## 2021-03-16 DIAGNOSIS — I10 Essential (primary) hypertension: Secondary | ICD-10-CM

## 2021-03-16 DIAGNOSIS — E782 Mixed hyperlipidemia: Secondary | ICD-10-CM

## 2021-03-16 NOTE — Progress Notes (Signed)
Chronic Care Management   Outreach Note  03/16/2021 Name: Todd Patterson MRN: [id number] DOB: 19-Oct-1950  Referred by: [name], MD Reason for referral : Chronic Care Management (Home BP reading updates)   Updated blood pressure in chart with patient-reported home BP:  BP Readings from Last 3 Encounters:  03/15/21 126/78  02/07/21 (!) 155/76  01/15/21 (!) 165/83    Patient Care Plan: General Pharmacy (Adult)     Problem Identified: HTN, HLD, CVA   Priority: High  Onset Date: 07/10/2020     Long-Range Goal: Patient-Specific Goal   Start Date: 07/12/2020  Expected End Date: 01/11/2021  Recent Progress: On track  Priority: High  Note:   Current Barriers:  No specific barriers to medication at this time.  Pharmacist Clinical Goal(s):  Patient will achieve adherence to monitoring guidelines and medication adherence to achieve therapeutic efficacy maintain control of BP as evidenced by home monitoring  contact provider office for questions/concerns as evidenced notation of same in electronic health record through collaboration with PharmD and provider.   Interventions: 1:1 collaboration with [name], MD regarding development and update of comprehensive plan of care as evidenced by provider attestation and co-signature Inter-disciplinary care team collaboration (see longitudinal plan of care) Comprehensive medication review performed; medication list updated in electronic medical record  Hypertension (BP goal <140/90) -Controlled -Current treatment: Carvedilol 6.25mg  BID Losartan 25mg  two tablets daily -Medications previously tried: none noted  -Current home readings: patient was at work so could -Current exercise habits: patient reports he is walking outside as the weather starts to warm up. -Denies hypotensive/hypertensive symptoms -Educated on BP goals and benefits of medications for prevention of heart attack, stroke and kidney damage; Exercise  goal of 150 minutes per week; Importance of home blood pressure monitoring;  -patient reports he is working on losing some weight -Counseled to monitor BP at home daily, document, and provide log at future appointments -Recommended to continue current medication  Update 02/15/21 Pt states BP is fluctuating up and down.  Mostly systolic has been in the 606T lately.  He continues to work on losing weight.  Has not checked at home in the past few weeks.  Will have him check it at least once or twice weekly and follow up. If BP remains elevated, future plans would be to increase to Losartan 100mg  daily. Will FU towards end of month to assess.   Hyperlipidemia/CAD/Hx of CVA: (LDL goal < 70) -Not ideally controlled -Current treatment: Atorvastatin 80mg  daily Clopidogrel 75mg  daily -Medications previously tried: simvastatin -Current exercise habits: see above -Educated on Cholesterol goals;  Benefits of statin for ASCVD risk reduction; Importance of limiting foods high in cholesterol; Exercise goal of 150 minutes per week;  -Reviewed most recent lipid panel, goal < 70 with hx of CVA -Recommended to continue current medication Recommended repeat lipid panel, would consider addition of Zetia if LDL still elevated.  Patient is working on exercise/diet to lose weigh.  Update 02/15/21 Continues same dose of statin, has not had updated lipid panel since September 2021.  Recommend updated lipid panel. If LDL still elevated would consider addition of Zetia. Encouraged patient to make appointment for updated lab work.  He is getting some labs with urology, however they are not doing routine labs.   Patient Goals/Self-Care Activities Patient will:  - take medications as prescribed focus on medication adherence by pill count check blood pressure daily, document, and provide at future appointments  Follow Up Plan: The care management team  will reach out to the patient again over the next 180  days.         Beverly Milch, PharmD, CPP Clinical Pharmacist Practitioner [place] [phone]

## 2021-03-17 DIAGNOSIS — I10 Essential (primary) hypertension: Secondary | ICD-10-CM

## 2021-03-17 DIAGNOSIS — E782 Mixed hyperlipidemia: Secondary | ICD-10-CM | POA: Diagnosis not present

## 2021-03-17 DIAGNOSIS — E785 Hyperlipidemia, unspecified: Secondary | ICD-10-CM | POA: Diagnosis not present

## 2021-03-19 NOTE — Progress Notes (Signed)
°  Radiation Oncology         [phone]) [phone] ________________________________  Name: Todd Patterson MRN: [id number]  Date: 02/21/2021  DOB: 05/07/1950  3D Planning Note   Prostate Brachytherapy Post-Implant Dosimetry  Diagnosis: 71 y.o. gentleman with Stage T1c adenocarcinoma of the prostate with Gleason score of 3+3, and PSA of 8.82.  Narrative: On a previous date, Todd Patterson returned following prostate seed implantation for post implant planning. He underwent CT scan complex simulation to delineate the three-dimensional structures of the pelvis and demonstrate the radiation distribution.  Since that time, the seed localization, and complex isodose planning with dose volume histograms have now been completed.  Results:   Prostate Coverage - The dose of radiation delivered to the 90% or more of the prostate gland (D90) was 114.15% of the prescription dose. This exceeds our goal of greater than 90%. Rectal Sparing - The volume of rectal tissue receiving the prescription dose or higher was 0.0 cc. This falls under our thresholds tolerance of 1.0 cc.  Impression: The prostate seed implant appears to show adequate target coverage and appropriate rectal sparing.  Plan:  The patient will continue to follow with urology for ongoing PSA determinations. I would anticipate a high likelihood for local tumor control with minimal risk for rectal morbidity.  ________________________________  Sheral Apley Tammi Klippel, M.D.

## 2021-04-03 ENCOUNTER — Other Ambulatory Visit: Payer: Self-pay | Admitting: Radiation Oncology

## 2021-04-03 MED ORDER — TAMSULOSIN HCL 0.4 MG PO CAPS: 0.4000 mg | ORAL_CAPSULE | Freq: Every day | ORAL | 5 refills | Status: DC

## 2021-04-03 NOTE — Progress Notes (Signed)
°  Radiation Oncology         [phone]) [phone] ________________________________  Name: Todd Patterson MRN: [id number]  Date: 04/03/2021  DOB: 01-Dec-1950  Telephone contact:  I received a message to call this patient and returned the phone call.  He is having some urinary hesitancy and increased nocturia post-seed implant.  I discussed this with him indicating it is very common after brachytherapy.  I offered Flomax, and the patient would like to try that.  We discussed the benefits and side effects of Flomax, and I sent the Rx to his pharmacy.  ________________________________  Sheral Apley Tammi Klippel, M.D.

## 2021-04-04 ENCOUNTER — Telehealth: Payer: Self-pay | Admitting: Adult Health

## 2021-04-04 DIAGNOSIS — M9901 Segmental and somatic dysfunction of cervical region: Secondary | ICD-10-CM | POA: Diagnosis not present

## 2021-04-04 DIAGNOSIS — M5136 Other intervertebral disc degeneration, lumbar region: Secondary | ICD-10-CM | POA: Diagnosis not present

## 2021-04-04 DIAGNOSIS — M5033 Other cervical disc degeneration, cervicothoracic region: Secondary | ICD-10-CM | POA: Diagnosis not present

## 2021-04-04 DIAGNOSIS — M9903 Segmental and somatic dysfunction of lumbar region: Secondary | ICD-10-CM | POA: Diagnosis not present

## 2021-04-04 NOTE — Telephone Encounter (Signed)
Call patient to discuss survivorship care plan visit referral.  He would like to do a phone visit.  I have placed this scheduling message for him to see Todd Patterson on February 2 at 9 AM.  Todd Patterson verbalized understanding and appreciation of the call.  Todd Bihari, NP 04/04/21 2:31 PM Medical Oncology and Hematology [facility] [place], [place] [address] Tel. [phone]    Fax. [phone]

## 2021-04-09 ENCOUNTER — Telehealth: Payer: Self-pay

## 2021-04-09 NOTE — Telephone Encounter (Signed)
Called Todd Patterson to check back to see if medication that was prescribed was helping or not and he stated that it helped.  Appreciated the call and thanks everyone for the assistance.  Nothing else follows.

## 2021-04-25 DIAGNOSIS — M5033 Other cervical disc degeneration, cervicothoracic region: Secondary | ICD-10-CM | POA: Diagnosis not present

## 2021-04-25 DIAGNOSIS — M9901 Segmental and somatic dysfunction of cervical region: Secondary | ICD-10-CM | POA: Diagnosis not present

## 2021-04-25 DIAGNOSIS — M5136 Other intervertebral disc degeneration, lumbar region: Secondary | ICD-10-CM | POA: Diagnosis not present

## 2021-04-25 DIAGNOSIS — M9903 Segmental and somatic dysfunction of lumbar region: Secondary | ICD-10-CM | POA: Diagnosis not present

## 2021-04-26 DIAGNOSIS — C61 Malignant neoplasm of prostate: Secondary | ICD-10-CM | POA: Diagnosis not present

## 2021-04-27 ENCOUNTER — Other Ambulatory Visit: Payer: Self-pay | Admitting: Family Medicine

## 2021-04-27 ENCOUNTER — Other Ambulatory Visit: Payer: Self-pay

## 2021-05-03 DIAGNOSIS — C61 Malignant neoplasm of prostate: Secondary | ICD-10-CM | POA: Diagnosis not present

## 2021-05-03 DIAGNOSIS — R350 Frequency of micturition: Secondary | ICD-10-CM | POA: Diagnosis not present

## 2021-05-03 DIAGNOSIS — N401 Enlarged prostate with lower urinary tract symptoms: Secondary | ICD-10-CM | POA: Diagnosis not present

## 2021-05-07 ENCOUNTER — Telehealth: Payer: Self-pay | Admitting: Genetic Counselor

## 2021-05-07 NOTE — Telephone Encounter (Signed)
Scheduled appt per 2/20 referral. Pt is aware of appt date and time. Pt is aware to arrive 15 mins prior to appt time and to bring and updated insurance card. Pt is aware of appt location.   °

## 2021-05-16 DIAGNOSIS — M9901 Segmental and somatic dysfunction of cervical region: Secondary | ICD-10-CM | POA: Diagnosis not present

## 2021-05-16 DIAGNOSIS — M5033 Other cervical disc degeneration, cervicothoracic region: Secondary | ICD-10-CM | POA: Diagnosis not present

## 2021-05-16 DIAGNOSIS — M9903 Segmental and somatic dysfunction of lumbar region: Secondary | ICD-10-CM | POA: Diagnosis not present

## 2021-05-16 DIAGNOSIS — M5136 Other intervertebral disc degeneration, lumbar region: Secondary | ICD-10-CM | POA: Diagnosis not present

## 2021-05-17 ENCOUNTER — Inpatient Hospital Stay: Payer: Medicare HMO | Attending: Genetic Counselor | Admitting: *Deleted

## 2021-05-17 ENCOUNTER — Encounter: Payer: Self-pay | Admitting: *Deleted

## 2021-05-17 ENCOUNTER — Other Ambulatory Visit: Payer: Self-pay

## 2021-05-17 DIAGNOSIS — C61 Malignant neoplasm of prostate: Secondary | ICD-10-CM

## 2021-05-17 NOTE — Progress Notes (Signed)
2 Identifiers used for verification purposes only. No vital signs taken as this was a telephone visit. SCP reviewed and completed. Pt still has some urinary frequency and nocturia some nights, but overall he feels like these side effects are resolving. Pt 's most recent PSA was 1.7. Pt is updated with vaccinations. Colonoscopy is due this year. He will call Dr. Dennard Schaumann to set this up. I have ordered lung cancer screening b/c of his former hx of smoking. Pt did discuss with me that he wants to check into genetic testing because of family hx of cancers. I will reach out to genetics.  Pt does exercise 3-4x a week by walking approx 2-4 miles.I advised pt to wear a cap to avoid direct sunlight to his face and to wear sunscreen if in direct sunlight for extended periods of time while golfing. ?

## 2021-06-05 ENCOUNTER — Encounter: Payer: Self-pay | Admitting: Genetic Counselor

## 2021-06-06 ENCOUNTER — Inpatient Hospital Stay: Payer: Medicare HMO

## 2021-06-06 ENCOUNTER — Other Ambulatory Visit: Payer: Self-pay

## 2021-06-06 ENCOUNTER — Inpatient Hospital Stay (HOSPITAL_BASED_OUTPATIENT_CLINIC_OR_DEPARTMENT_OTHER): Payer: Medicare HMO | Admitting: Genetic Counselor

## 2021-06-06 ENCOUNTER — Encounter: Payer: Self-pay | Admitting: Genetic Counselor

## 2021-06-06 ENCOUNTER — Ambulatory Visit (HOSPITAL_COMMUNITY): Payer: Medicare HMO

## 2021-06-06 DIAGNOSIS — M9903 Segmental and somatic dysfunction of lumbar region: Secondary | ICD-10-CM | POA: Diagnosis not present

## 2021-06-06 DIAGNOSIS — Z803 Family history of malignant neoplasm of breast: Secondary | ICD-10-CM

## 2021-06-06 DIAGNOSIS — D126 Benign neoplasm of colon, unspecified: Secondary | ICD-10-CM

## 2021-06-06 DIAGNOSIS — M9901 Segmental and somatic dysfunction of cervical region: Secondary | ICD-10-CM | POA: Diagnosis not present

## 2021-06-06 DIAGNOSIS — C61 Malignant neoplasm of prostate: Secondary | ICD-10-CM

## 2021-06-06 DIAGNOSIS — M5033 Other cervical disc degeneration, cervicothoracic region: Secondary | ICD-10-CM | POA: Diagnosis not present

## 2021-06-06 DIAGNOSIS — M5136 Other intervertebral disc degeneration, lumbar region: Secondary | ICD-10-CM | POA: Diagnosis not present

## 2021-06-06 LAB — GENETIC SCREENING ORDER

## 2021-06-06 NOTE — Progress Notes (Signed)
REFERRING PROVIDER: ?[name], MD ?[address] [place],  [place] [address] ? ?PRIMARY PROVIDER:  ?[name], MD ? ?PRIMARY REASON FOR VISIT:  ?1. Polyposis coli   ?2. Family history of breast cancer   ?3. Malignant neoplasm of prostate ([place])   ? ? ? ?HISTORY OF PRESENT ILLNESS:   ?Todd Patterson, a 71 y.o. male, was seen for a [place] cancer genetics consultation at the request of Dr. Dennard Schaumann due to a personal and family history of cancer and a personal history of polyposis.  Todd Patterson presents to clinic today to discuss the possibility of a hereditary predisposition to cancer, genetic testing, and to further clarify his future cancer risks, as well as potential cancer risks for family members.  ? ?In June 2022, at the age of 21, Todd Patterson was diagnosed with Gleason=6 prostate cancer.  He has had radioactive seeds inserted. 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    ?   ? ?CANCER HISTORY:  ?Oncology History  ?Malignant neoplasm of prostate ([place])  ?09/13/2020 Cancer Staging  ? Staging form: Prostate, AJCC 8th Edition ?- Clinical stage from 09/13/2020: Stage I (cT1c, cN0, cM0, PSA: 8.8, Grade Group: 1) - Signed by Freeman Caldron, PA-C on 10/12/2020 ?Histopathologic type: Adenocarcinoma, NOS ?Stage prefix: Initial diagnosis ?Prostate specific antigen (PSA) range: Less than 10 ?Gleason primary pattern: 3 ?Gleason secondary pattern: 3 ?Gleason score: 6 ?Histologic grading system: 5 grade system ?Number of biopsy cores examined: 12 ?Number of biopsy cores positive: 9 ?Location of positive needle core biopsies: Both sides ? ?  ?10/12/2020 Initial Diagnosis  ? Malignant neoplasm of prostate ([place]) ?  ? ? ?Past Medical History:  ?Diagnosis Date  ? 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 neurologist-- dr Krista Blue;  09-05-2017 acute onset right 6th nerve palsy with ischemic event,  started on plavix;  06/ 2021 left 6th nerve palsy probable same etiology as the right  ?  Anticoagulant long-term use   ? plavix---  managed by pcp  ? Arthritis   ? Coronary artery disease   ? cardiologist--- dr Gwenlyn Found; 2010  hx MI s/p cath with DES x1  to LAD per cardiology note done in [place]  ? Dental bridge present   ? x2 upper  both permanent  ? Family history of breast cancer   ? Glaucoma, both eyes   ? History of COVID-19 03/21/2020  ? result in epic  ? History of myocardial infarction 2010  ? PCI w/ DES to LAD  ? Hx of colonic polyps   ? colonoscopy 2018, recommended repeat in 1 year due to numbe rof polyps.   ? Hyperlipidemia   ? Hypertension   ? Malignant neoplasm prostate ([place])   ? urologist-- dr gay;  dx 06/ 2022, gleason 3+3, PSA 8.82  ? Polyposis coli   ? Primary hyperparathyroidism ([place])   ? S/P drug eluting coronary stent placement 2010  ? to LAD  ? ? ?Past Surgical History:  ?Procedure Laterality Date  ? APPENDECTOMY  1974  ? age 75  ? CORONARY ANGIOPLASTY WITH STENT PLACEMENT  2010  ? DES x1 LAD  (in [place])  ? CYSTOSCOPY N/A 01/15/2021  ? Procedure: CYSTOSCOPY FLEXIBLE;  Surgeon: Janith Lima, MD;  Location: [facility];  Service: Urology;  Laterality: N/A;  No seeds detected in bladder per Dr. Abner Greenspan  ? PARATHYROIDECTOMY N/A 07/04/2016  ? Procedure: PARATHYROIDECTOMY;  Surgeon:  Jackolyn Confer, MD;  Location: [facility];  Service: General;  Laterality: N/A;  ? RADIOACTIVE SEED IMPLANT N/A 01/15/2021  ? Procedure: RADIOACTIVE SEED IMPLANT/BRACHYTHERAPY IMPLANT;  Surgeon: Janith Lima, MD;  Location: [facility];  Service: Urology;  Laterality: N/A;  ? SPACE OAR INSTILLATION N/A 01/15/2021  ? Procedure: SPACE OAR INSTILLATION;  Surgeon: Janith Lima, MD;  Location: [facility];  Service: Urology;  Laterality: N/A;  ? [place]  ? age 55  ? ? ?Social History  ? ?Socioeconomic History  ? Marital status: Married  ?  Spouse name: Not on file  ? Number of children: Not on file  ? Years of education: Not on file  ?  Highest education level: Not on file  ?Occupational History  ? Not on file  ?Tobacco Use  ? Smoking status: Former  ?  Packs/day: 2.00  ?  Years: 42.00  ?  Pack years: 84.00  ?  Types: Cigarettes  ?  Quit date: 2010  ?  Years since quitting: 13.2  ? Smokeless tobacco: Never  ?Vaping Use  ? Vaping Use: Never used  ?Substance and Sexual Activity  ? Alcohol use: Yes  ?  Alcohol/week: 14.0 - 21.0 standard drinks  ?  Types: 14 - 21 Cans of beer per week  ?  Comment: 01-09-2021  pt stated 2-3 (12oz) beers daily  ? Drug use: No  ? Sexual activity: Yes  ?Other Topics Concern  ? Not on file  ?Social History Narrative  ? Not on file  ? ?Social Determinants of Health  ? ?Financial Resource Strain: Low Risk   ? Difficulty of Paying Living Expenses: Not hard at all  ?Food Insecurity: No Food Insecurity  ? Worried About Charity fundraiser in the Last Year: Never true  ? Ran Out of Food in the Last Year: Never true  ?Transportation Needs: No Transportation Needs  ? Lack of Transportation (Medical): No  ? Lack of Transportation (Non-Medical): No  ?Physical Activity: Inactive  ? Days of Exercise per Week: 0 days  ? Minutes of Exercise per Session: 0 min  ?Stress: No Stress Concern Present  ? Feeling of Stress : Not at all  ?Social Connections: Moderately Integrated  ? Frequency of Communication with Friends and Family: More than three times a week  ? Frequency of Social Gatherings with Friends and Family: More than three times a week  ? Attends Religious Services: Never  ? Active Member of Clubs or Organizations: Yes  ? Attends Archivist Meetings: More than 4 times per year  ? Marital Status: Married  ?  ? ?FAMILY HISTORY:  ?We obtained a detailed, 4-generation family history.  Significant diagnoses are listed below: ?Family History  ?Problem Relation Age of Onset  ? Dementia Mother   ? Heart disease Father   ? Cancer Sister   ?     d. 70  ? Breast cancer Sister 52  ?     d. 21  ? Cancer Niece   ?     NOS, d. 44  ?  Throat cancer Nephew   ?     d. 83  ? ? ?The patient has two daughters who are cancer free.  He has three brothers and four sisters.  One sister died at 43 from an unknown cancer.  Her son died at 66 from throat cancer.  Another sister had breast cancer at 66, and a brother had a daughter  die from an unknown cancer at 11.  There is no other cancer reported in the family. ? ?Mr. Monnin is unaware of previous family history of genetic testing for hereditary cancer risks. Patient's maternal ancestors are of [place] and [place] descent, and paternal ancestors are of [place] and Native American descent. There is no reported Ashkenazi Jewish ancestry. There is no known consanguinity. ? ?GENETIC COUNSELING ASSESSMENT: Mr. Neuharth is a 71 y.o. male with a personal and family history of cancer and a personal history of polyposis which is somewhat suggestive of a hereditary cancer syndrome and predisposition to cancer given his polyposis and the young cancers in the family. We, therefore, discussed and recommended the following at today's visit.  ? ?DISCUSSION: We discussed that, in general, most cancer is not inherited in families, but instead is sporadic or familial. Sporadic cancers occur by chance and typically happen at older ages (>50 years) as this type of cancer is caused by genetic changes acquired during an individual?s lifetime. Some families have more cancers than would be expected by chance; however, the ages or types of cancer are not consistent with a known genetic mutation or known genetic mutations have been ruled out. This type of familial cancer is thought to be due to a combination of multiple genetic, environmental, hormonal, and lifestyle factors. While this combination of factors likely increases the risk of cancer, the exact source of this risk is not currently identifiable or testable. ? ?We discussed that up to 15% of prostate cancer is hereditary, with most cases associated with BRCA mutations.  There are  other genes that can be associated with hereditary prostate cancer syndromes.  These include CHEK2, PALB2 and the Lynch syndrome genes.  Additionally, polyposis genes include APC, MUTYH, BMPR1A and others.  We

## 2021-06-21 ENCOUNTER — Telehealth: Payer: Medicare HMO

## 2021-06-27 DIAGNOSIS — M9903 Segmental and somatic dysfunction of lumbar region: Secondary | ICD-10-CM | POA: Diagnosis not present

## 2021-06-27 DIAGNOSIS — M5136 Other intervertebral disc degeneration, lumbar region: Secondary | ICD-10-CM | POA: Diagnosis not present

## 2021-06-27 DIAGNOSIS — M9901 Segmental and somatic dysfunction of cervical region: Secondary | ICD-10-CM | POA: Diagnosis not present

## 2021-06-27 DIAGNOSIS — M5033 Other cervical disc degeneration, cervicothoracic region: Secondary | ICD-10-CM | POA: Diagnosis not present

## 2021-07-05 ENCOUNTER — Ambulatory Visit: Payer: Self-pay | Admitting: Genetic Counselor

## 2021-07-05 ENCOUNTER — Telehealth: Payer: Self-pay | Admitting: Genetic Counselor

## 2021-07-05 DIAGNOSIS — Z1379 Encounter for other screening for genetic and chromosomal anomalies: Secondary | ICD-10-CM

## 2021-07-05 NOTE — Progress Notes (Signed)
HPI:  Mr. Sherrer was previously seen in the [place] clinic due to a personal and family history of cancer and personal history of polyposis and concerns regarding a hereditary predisposition to cancer. Please refer to our prior cancer genetics clinic note for more information regarding our discussion, assessment and recommendations, at the time. Mr. Oestreicher recent genetic test results were disclosed to him, as were recommendations warranted by these results. These results and recommendations are discussed in more detail below. ? ?CANCER HISTORY:  ?Oncology History  ?Malignant neoplasm of prostate ([place])  ?09/13/2020 Cancer Staging  ? Staging form: Prostate, AJCC 8th Edition ?- Clinical stage from 09/13/2020: Stage I (cT1c, cN0, cM0, PSA: 8.8, Grade Group: 1) - Signed by Freeman Caldron, PA-C on 10/12/2020 ?Histopathologic type: Adenocarcinoma, NOS ?Stage prefix: Initial diagnosis ?Prostate specific antigen (PSA) range: Less than 10 ?Gleason primary pattern: 3 ?Gleason secondary pattern: 3 ?Gleason score: 6 ?Histologic grading system: 5 grade system ?Number of biopsy cores examined: 12 ?Number of biopsy cores positive: 9 ?Location of positive needle core biopsies: Both sides ? ?  ?10/12/2020 Initial Diagnosis  ? Malignant neoplasm of prostate ([place]) ? ?  ? ? ?FAMILY HISTORY:  ?We obtained a detailed, 4-generation family history.  Significant diagnoses are listed below: ?Family History  ?Problem Relation Age of Onset  ? Dementia Mother   ? Heart disease Father   ? Cancer Sister   ?     d. 68  ? Breast cancer Sister 1  ?     d. 64  ? Cancer Niece   ?     NOS, d. 45  ? Throat cancer Nephew   ?     d. 9  ? ? ?The patient has two daughters who are cancer free.  He has three brothers and four sisters.  One sister died at 82 from an unknown cancer.  Her son died at 21 from throat cancer.  Another sister had breast cancer at 56, and a brother had a daughter die from an unknown cancer at 79.  There is no other  cancer reported in the family. ?  ?Mr. Barbary is unaware of previous family history of genetic testing for hereditary cancer risks. Patient's maternal ancestors are of [place] and [place] descent, and paternal ancestors are of [place] and Native American descent. There is no reported Ashkenazi Jewish ancestry. There is no known consanguinity. ? ?GENETIC TEST RESULTS: Genetic testing reported out on July 04, 2021 through the CancerNext-Expanded+RNAinsight panel cancer panel found no pathogenic mutations. The CancerNext-Expanded gene panel offered by [facility] and includes sequencing and rearrangement analysis for the following 77 genes: AIP, ALK, APC*, ATM*, AXIN2, BAP1, BARD1, BLM, BMPR1A, BRCA1*, BRCA2*, BRIP1*, CDC73, CDH1*, CDK4, CDKN1B, CDKN2A, CHEK2*, CTNNA1, DICER1, FANCC, FH, FLCN, GALNT12, KIF1B, LZTR1, MAX, MEN1, MET, MLH1*, MSH2*, MSH3, MSH6*, MUTYH*, NBN, NF1*, NF2, NTHL1, PALB2*, PHOX2B, PMS2*, POT1, PRKAR1A, PTCH1, PTEN*, RAD51C*, RAD51D*, RB1, RECQL, RET, SDHA, SDHAF2, SDHB, SDHC, SDHD, SMAD4, SMARCA4, SMARCB1, SMARCE1, STK11, SUFU, TMEM127, TP53*, TSC1, TSC2, VHL and XRCC2 (sequencing and deletion/duplication); EGFR, EGLN1, HOXB13, KIT, MITF, PDGFRA, POLD1, and POLE (sequencing only); EPCAM and GREM1 (deletion/duplication only). DNA and RNA analyses performed for * genes. The test report has been scanned into EPIC and is located under the Molecular Pathology section of the Results Review tab.  A portion of the result report is included below for reference.  ? ? ? ?We discussed with Mr. Hsiao that because current genetic testing is not perfect, it is possible  there may be a gene mutation in one of these genes that current testing cannot detect, but that chance is small.  We also discussed, that there could be another gene that has not yet been discovered, or that we have not yet tested, that is responsible for the cancer diagnoses in the family. It is also possible there is a hereditary cause for the  cancer in the family that Mr. Tonkinson did not inherit and therefore was not identified in his testing.  Therefore, it is important to remain in touch with cancer genetics in the future so that we can continue to offer Mr. Corales the most up to date genetic testing.  ? ?ADDITIONAL GENETIC TESTING: We discussed with Mr. Kosik that his genetic testing was fairly extensive.  If there are genes identified to increase cancer risk that can be analyzed in the future, we would be happy to discuss and coordinate this testing at that time.   ? ?CANCER SCREENING RECOMMENDATIONS: Mr. Vitelli test result is considered negative (normal).  This means that we have not identified a hereditary cause for his personal and family history of cancer and personal history of polyposis at this time. Most cancers happen by chance and this negative test suggests that his cancer may fall into this category.   ? ?While reassuring, this does not definitively rule out a hereditary predisposition to cancer. It is still possible that there could be genetic mutations that are undetectable by current technology. There could be genetic mutations in genes that have not been tested or identified to increase cancer risk.  Therefore, it is recommended he continue to follow the cancer management and screening guidelines provided by his oncology and primary healthcare provider.  ? ?An individual's cancer risk and medical management are not determined by genetic test results alone. Overall cancer risk assessment incorporates additional factors, including personal medical history, family history, and any available genetic information that may result in a personalized plan for cancer prevention and surveillance ?This negative genetic test simply tells [place] that we cannot yet define why Mr. Charrette has had an increased number of colorectal polyps. Mr. Melland medical management and screening should be based on the prospect that he will likely form more colon polyps in the  future and should, therefore, undergo more frequent colonoscopy screening at intervals determined by his GI providers.  We also recommended that Mr. Pettie have an upper endoscopy periodically. ? ?RECOMMENDATIONS FOR FAMILY MEMBERS:  Individuals in this family might be at some increased risk of developing cancer, over the general population risk, simply due to the family history of cancer.  We recommended women in this family have a yearly mammogram beginning at age 68, or 9 years younger than the earliest onset of cancer, an annual clinical breast exam, and perform monthly breast self-exams. Women in this family should also have a gynecological exam as recommended by their primary provider. All family members should be referred for colonoscopy starting at age 69. ? ?FOLLOW-UP: Lastly, we discussed with Mr. Illescas that cancer genetics is a rapidly advancing field and it is possible that new genetic tests will be appropriate for him and/or his family members in the future. We encouraged him to remain in contact with cancer genetics on an annual basis so we can update his personal and family histories and let him know of advances in cancer genetics that may benefit this family.  ? ?Our contact number was provided. Mr. Helzer questions were answered to his satisfaction, and he  knows he is welcome to call us at anytime with additional questions or concerns.  ? ?Roma Kayser, MS, [place] ?Licensed, Insurance risk surveyor ?[email or web address] ? ?

## 2021-07-05 NOTE — Telephone Encounter (Signed)
Revealed negative genetic testing.  Discussed that we do not know why he has prostate cancer and polyposis or why there is cancer in the family. It could be due to a different gene that we are not testing, or maybe our current technology may not be able to pick something up.  It will be important for him to keep in contact with genetics to keep up with whether additional testing may be needed. ? ? ?

## 2021-07-18 DIAGNOSIS — M9903 Segmental and somatic dysfunction of lumbar region: Secondary | ICD-10-CM | POA: Diagnosis not present

## 2021-07-18 DIAGNOSIS — M9901 Segmental and somatic dysfunction of cervical region: Secondary | ICD-10-CM | POA: Diagnosis not present

## 2021-07-18 DIAGNOSIS — M5136 Other intervertebral disc degeneration, lumbar region: Secondary | ICD-10-CM | POA: Diagnosis not present

## 2021-07-18 DIAGNOSIS — M5033 Other cervical disc degeneration, cervicothoracic region: Secondary | ICD-10-CM | POA: Diagnosis not present

## 2021-08-09 DIAGNOSIS — M9901 Segmental and somatic dysfunction of cervical region: Secondary | ICD-10-CM | POA: Diagnosis not present

## 2021-08-09 DIAGNOSIS — M5136 Other intervertebral disc degeneration, lumbar region: Secondary | ICD-10-CM | POA: Diagnosis not present

## 2021-08-09 DIAGNOSIS — M5033 Other cervical disc degeneration, cervicothoracic region: Secondary | ICD-10-CM | POA: Diagnosis not present

## 2021-08-09 DIAGNOSIS — M9903 Segmental and somatic dysfunction of lumbar region: Secondary | ICD-10-CM | POA: Diagnosis not present

## 2021-09-04 DIAGNOSIS — M5033 Other cervical disc degeneration, cervicothoracic region: Secondary | ICD-10-CM | POA: Diagnosis not present

## 2021-09-04 DIAGNOSIS — M9903 Segmental and somatic dysfunction of lumbar region: Secondary | ICD-10-CM | POA: Diagnosis not present

## 2021-09-04 DIAGNOSIS — M5136 Other intervertebral disc degeneration, lumbar region: Secondary | ICD-10-CM | POA: Diagnosis not present

## 2021-09-04 DIAGNOSIS — M9901 Segmental and somatic dysfunction of cervical region: Secondary | ICD-10-CM | POA: Diagnosis not present

## 2021-09-28 ENCOUNTER — Other Ambulatory Visit: Payer: Self-pay | Admitting: Radiation Oncology

## 2021-09-29 ENCOUNTER — Other Ambulatory Visit: Payer: Self-pay | Admitting: Family Medicine

## 2021-10-01 NOTE — Telephone Encounter (Signed)
Requested medications are due for refill today.  yes  Requested medications are on the active medications list.  yes  Last refill. 11/02/2020 #180 3 refills  Future visit scheduled.   yes  Notes to clinic.  Pt is more than 3 months overdue for OV.    Requested Prescriptions  Pending Prescriptions Disp Refills   losartan (COZAAR) 25 MG tablet [Pharmacy Med Name: LOSARTAN POTASSIUM 25 MG TAB] 180 tablet 3    Sig: TAKE 2 TABLETS BY MOUTH EVERY DAY     Cardiovascular:  Angiotensin Receptor Blockers Failed - 09/29/2021  8:29 AM      Failed - Cr in normal range and within 180 days    Creat  Date Value Ref Range Status  12/16/2019 0.74 0.70 - 1.25 mg/dL Final    Comment:    For patients >20 years of age, the reference limit for Creatinine is approximately 13% higher for people identified as African-American. .    Creatinine, Ser  Date Value Ref Range Status  01/11/2021 0.70 0.61 - 1.24 mg/dL Final         Failed - K in normal range and within 180 days    Potassium  Date Value Ref Range Status  01/11/2021 4.2 3.5 - 5.1 mmol/L Final         Failed - Valid encounter within last 6 months    Recent Outpatient Visits           1 year ago Essential hypertension   [place], Warren T, MD   2 years ago 6th nerve palsy, left   [place] Dennard Schaumann, Cammie Mcgee, MD   2 years ago Chronic pain of right knee   [place] [name], MD   3 years ago Acute pain of right knee   [place] Dennard Schaumann, Cammie Mcgee, MD   3 years ago Upper airway cough syndrome   [place], Cammie Mcgee, MD              Passed - Patient is not pregnant      Passed - Last BP in normal range    BP Readings from Last 1 Encounters:  03/15/21 126/78

## 2021-10-01 NOTE — Telephone Encounter (Signed)
Called pt and made appt for yearly.  Pt would like to know if he will need labs for his appt.  Pt would like for Dr. Dennard Schaumann and Dr. Brigitte Pulse to coordinate labs. Please let pt know if he needs to come in early for labs.

## 2021-10-09 DIAGNOSIS — M9901 Segmental and somatic dysfunction of cervical region: Secondary | ICD-10-CM | POA: Diagnosis not present

## 2021-10-09 DIAGNOSIS — M9903 Segmental and somatic dysfunction of lumbar region: Secondary | ICD-10-CM | POA: Diagnosis not present

## 2021-10-09 DIAGNOSIS — M5136 Other intervertebral disc degeneration, lumbar region: Secondary | ICD-10-CM | POA: Diagnosis not present

## 2021-10-09 DIAGNOSIS — M5033 Other cervical disc degeneration, cervicothoracic region: Secondary | ICD-10-CM | POA: Diagnosis not present

## 2021-10-19 ENCOUNTER — Ambulatory Visit (INDEPENDENT_AMBULATORY_CARE_PROVIDER_SITE_OTHER): Payer: Medicare HMO | Admitting: Family Medicine

## 2021-10-19 VITALS — BP 118/58 | HR 71 | Temp 97.6°F | Ht 69.0 in | Wt 230.2 lb

## 2021-10-19 DIAGNOSIS — Z136 Encounter for screening for cardiovascular disorders: Secondary | ICD-10-CM

## 2021-10-19 DIAGNOSIS — I639 Cerebral infarction, unspecified: Secondary | ICD-10-CM | POA: Diagnosis not present

## 2021-10-19 DIAGNOSIS — I2510 Atherosclerotic heart disease of native coronary artery without angina pectoris: Secondary | ICD-10-CM

## 2021-10-19 DIAGNOSIS — R739 Hyperglycemia, unspecified: Secondary | ICD-10-CM | POA: Diagnosis not present

## 2021-10-19 DIAGNOSIS — Z1211 Encounter for screening for malignant neoplasm of colon: Secondary | ICD-10-CM | POA: Diagnosis not present

## 2021-10-19 NOTE — Progress Notes (Signed)
Subjective:    Patient ID: Todd Patterson, male    DOB: [birth year], 71 y.o.   MRN: [id number]   Patient is a very pleasant 71 year old white male whose past medical history is significant for myocardial infarction in 2008. Patient underwent PTCA with stenting.  Patient states that he had a bare-metal stent. He was continued on aspirin and Plavix for 2 years and then was instructed to continue aspirin thereafter. He also has a history of hyperlipidemia and hypertension.  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  After these incidents, he was switched to plavix instead of aspirin.  I have not seen the patient since 2021.   Patient has a history of prostate cancer however he sees a urologist to monitor his PSA.  He has an appointment with him next week.  He quit smoking 15 years ago.  We discussed a CT scan for lung cancer screening and the patient politely declined.  He is due for AAA screening.  He is due for a flu shot in the fall.  He is due for a shingles vaccine.  We discussed RSV vaccination and COVID vaccination.  He is also due for fasting lab work. 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   neurologist-- dr Krista Blue;  09-05-2017 acute onset right 6th nerve palsy with ischemic event,  started on plavix;  06/ 2021 left 6th nerve palsy probable same etiology as the right   Anticoagulant long-term use    plavix---  managed by pcp   Arthritis    Coronary artery disease    cardiologist--- dr Gwenlyn Found; 2010  hx MI s/p cath with DES x1  to LAD per cardiology note done in [place] present    x2 upper  both permanent   Family history of breast cancer    Glaucoma, both eyes    History of COVID-19 03/21/2020   result in epic   History of myocardial infarction 2010   PCI w/ DES to LAD   Hx of colonic polyps    colonoscopy 2018, recommended repeat in 1 year due to numbe rof polyps.    Hyperlipidemia    Hypertension     Malignant neoplasm prostate [facility])    urologist-- dr gay;  dx 06/ 2022, gleason 3+3, PSA 8.82   Polyposis coli    Primary hyperparathyroidism ([place])    S/P drug eluting coronary stent placement 2010   to LAD   Past Surgical History:  Procedure Laterality Date   APPENDECTOMY  1974   age 56   CORONARY ANGIOPLASTY WITH STENT PLACEMENT  2010   DES x1 LAD  (in [place])   CYSTOSCOPY N/A 01/15/2021   Procedure: CYSTOSCOPY FLEXIBLE;  Surgeon: Janith Lima, MD;  Location[facility];  Service: Urology;  Laterality: N/A;  No seeds detected in bladder per Dr. Abner Greenspan   PARATHYROIDECTOMY N/A 07/04/2016   Procedure: PARATHYROIDECTOMY;  Surgeon: Jackolyn Confer, MD;  Location: [facility];  Service: General;  Laterality: N/A;   RADIOACTIVE SEED IMPLANT N/A 01/15/2021   Procedure: RADIOACTIVE SEED IMPLANT/BRACHYTHERAPY IMPLANT;  Surgeon: Janith Lima, MD;  Location: [facility];  Service: Urology;  Laterality: N/A;   SPACE OAR INSTILLATION N/A 01/15/2021   Procedure: SPACE OAR INSTILLATION;  Surgeon: Janith Lima, MD;  [facility];  Service: Urology;  Laterality: N/A;   TONSILLECTOMY AND ADENOIDECTOMY  1970   age  18   Current Outpatient Medications on File Prior to Visit  Medication Sig Dispense Refill   atorvastatin (LIPITOR) 80 MG tablet TAKE 1 TABLET BY MOUTH EVERY DAY 90 tablet 3   carvedilol (COREG) 6.25 MG tablet Take 1 tablet (6.25 mg total) by mouth 2 (two) times daily with a meal. 180 tablet 1   clopidogrel (PLAVIX) 75 MG tablet TAKE 1 TABLET (75 MG TOTAL) BY MOUTH DAILY. PLEASE REQUEST FUTURE REFILLS FROM PCP. 90 tablet 3   docusate sodium (COLACE) 100 MG capsule Take 1 capsule (100 mg total) by mouth daily as needed for up to 30 doses. (Patient not taking: Reported on 05/17/2021) 30 capsule 0   dorzolamide-timolol (COSOPT) 22.3-6.8 MG/ML ophthalmic solution SMARTSIG:In Eye(s)     ibuprofen (ADVIL) 200 MG tablet Take 200 mg by mouth every 6  (six) hours as needed.     latanoprost (XALATAN) 0.005 % ophthalmic solution Place 1 drop into both eyes at bedtime.     losartan (COZAAR) 25 MG tablet TAKE 2 TABLETS BY MOUTH EVERY DAY 180 tablet 1   tamsulosin (FLOMAX) 0.4 MG CAPS capsule TAKE 1 CAPSULE BY MOUTH EVERY DAY AFTER SUPPER 90 capsule 1   No current facility-administered medications on file prior to visit.   Allergies  Allergen Reactions   Lisinopril Cough   Social History   Socioeconomic History   Marital status: Married    Spouse name: Not on file   Number of children: Not on file   Years of education: Not on file   Highest education level: Not on file  Occupational History   Not on file  Tobacco Use   Smoking status: Former    Packs/day: 2.00    Years: 42.00    Total pack years: 84.00    Types: Cigarettes    Quit date: 2010    Years since quitting: 13.5   Smokeless tobacco: Never  Vaping Use   Vaping Use: Never used  Substance and Sexual Activity   Alcohol use: Yes    Alcohol/week: 14.0 - 21.0 standard drinks of alcohol    Types: 14 - 21 Cans of beer per week    Comment: 01-09-2021  pt stated 2-3 (12oz) beers daily   Drug use: No   Sexual activity: Yes  Other Topics Concern   Not on file  Social History Narrative   Not on file   Social Determinants of Health   Financial Resource Strain: Low Risk  (08/17/2020)   Overall Financial Resource Strain (CARDIA)    Difficulty of Paying Living Expenses: Not hard at all  Food Insecurity: No Food Insecurity (08/17/2020)   Hunger Vital Sign    Worried About Running Out of Food in the Last Year: Never true    Ran Out of Food in the Last Year: Never true  Transportation Needs: No Transportation Needs (08/17/2020)   PRAPARE - Hydrologist (Medical): No    Lack of Transportation (Non-Medical): No  Physical Activity: Inactive (08/17/2020)   Exercise Vital Sign    Days of Exercise per Week: 0 days    Minutes of Exercise per Session: 0 min   Stress: No Stress Concern Present (08/17/2020)   [place]    Feeling of Stress : Not at all  Social Connections: Moderately Integrated (08/17/2020)   Social Connection and Isolation Panel [NHANES]    Frequency of Communication with Friends and Family: More than three times a week  Frequency of Social Gatherings with Friends and Family: More than three times a week    Attends Religious Services: Never    Marine scientist or Organizations: Yes    Attends Music therapist: More than 4 times per year    Marital Status: Married  Human resources officer Violence: Not At Risk (08/17/2020)   Humiliation, Afraid, Rape, and Kick questionnaire    Fear of Current or Ex-Partner: No    Emotionally Abused: No    Physically Abused: No    Sexually Abused: No   Family History  Problem Relation Age of Onset   Dementia Mother    Heart disease Father    Cancer Sister        d. 82   Breast cancer Sister 71       d. 69   Cancer Niece        NOS, d. 48   Throat cancer Nephew        d. 6      Review of Systems  Neurological:  Positive for headaches.  All other systems reviewed and are negative.      Objective:   Physical Exam Vitals reviewed.  Constitutional:      General: He is not in acute distress.    Appearance: He is well-developed. He is obese. He is not ill-appearing or diaphoretic.  HENT:     Head: Normocephalic and atraumatic.     Right Ear: External ear normal.     Left Ear: External ear normal.     Nose: Nose normal.     Mouth/Throat:     Pharynx: No oropharyngeal exudate.  Eyes:     General: No scleral icterus.       Right eye: No discharge.        Left eye: No discharge.     Extraocular Movements:     Right eye: Normal extraocular motion and no nystagmus.     Left eye: Normal extraocular motion and no nystagmus.     Conjunctiva/sclera: Conjunctivae normal.     Pupils: Pupils are equal,  round, and reactive to light.  Neck:     Thyroid: No thyromegaly.     Vascular: No carotid bruit or JVD.     Trachea: No tracheal deviation.  Cardiovascular:     Rate and Rhythm: Normal rate and regular rhythm.     Heart sounds: Normal heart sounds. No murmur heard.    No friction rub. No gallop.  Pulmonary:     Effort: Pulmonary effort is normal. No respiratory distress.     Breath sounds: Normal breath sounds. No stridor. No wheezing or rales.  Chest:     Chest wall: No tenderness.  Abdominal:     General: Bowel sounds are normal. There is no distension.     Palpations: Abdomen is soft. There is no mass.     Tenderness: There is no abdominal tenderness. There is no guarding or rebound.  Musculoskeletal:        General: No tenderness or deformity. Normal range of motion.     Cervical back: Normal range of motion and neck supple. No rigidity.     Right lower leg: Edema present.     Left lower leg: Edema present.  Lymphadenopathy:     Cervical: No cervical adenopathy.  Skin:    General: Skin is warm.     Coloration: Skin is not jaundiced or pale.     Findings: No bruising, erythema, lesion or rash.  Neurological:     Mental Status: He is alert and oriented to person, place, and time.     Cranial Nerves: No cranial nerve deficit.     Sensory: No sensory deficit.     Motor: No tremor, atrophy or abnormal muscle tone.     Coordination: Coordination normal.     Gait: Gait normal.     Deep Tendon Reflexes: Reflexes are normal and symmetric.  Psychiatric:        Behavior: Behavior normal.        Thought Content: Thought content normal.        Judgment: Judgment normal.   Patient has a small umbilical hernia        Assessment & Plan:  Colon cancer screening - Plan: Ambulatory referral to Gastroenterology  Coronary artery disease involving native coronary artery of native heart without angina pectoris - Plan: CBC with Differential/Platelet, Lipid panel, COMPLETE METABOLIC  PANEL WITH GFR  Cerebrovascular accident (CVA), unspecified mechanism ([place]) - Plan: CBC with Differential/Platelet, Lipid panel, COMPLETE METABOLIC PANEL WITH GFR  Encounter for abdominal aortic aneurysm (AAA) screening - Plan: US AORTA Patient sees urology to monitor his prostate.  His last colonoscopy was in 2018.  They found more than 8 polyps.  They recommended a repeat colonoscopy in 1 year.  I believe that this slipped through the system due to [place].  Therefore I will contact gastroenterology at [facility] where he had his last procedure done and arrange a follow-up colonoscopy.  We discussed a CT scan for lung cancer screening and the patient declined.  I will set the patient up for an ultrasound to screen for AAA.  Check CBC CMP and a lipid panel.  Ideally I like his LDL cholesterol to be below 70 given his vascular history.  I recommended the shingles vaccine, flu shot.  We discussed the risk and benefits of RSV and COVID and he elected against these.

## 2021-10-22 DIAGNOSIS — C61 Malignant neoplasm of prostate: Secondary | ICD-10-CM | POA: Diagnosis not present

## 2021-10-24 LAB — CBC WITH DIFFERENTIAL/PLATELET
Absolute Monocytes: 917 cells/uL (ref 200–950)
Basophils Absolute: 52 cells/uL (ref 0–200)
Basophils Relative: 0.5 %
Eosinophils Absolute: 371 cells/uL (ref 15–500)
Eosinophils Relative: 3.6 %
HCT: 44.1 % (ref 38.5–50.0)
Hemoglobin: 14.9 g/dL (ref 13.2–17.1)
Lymphs Abs: 2359 cells/uL (ref 850–3900)
MCH: 30.2 pg (ref 27.0–33.0)
MCHC: 33.8 g/dL (ref 32.0–36.0)
MCV: 89.3 fL (ref 80.0–100.0)
MPV: 10.9 fL (ref 7.5–12.5)
Monocytes Relative: 8.9 %
Neutro Abs: 6602 cells/uL (ref 1500–7800)
Neutrophils Relative %: 64.1 %
Platelets: 245 10*3/uL (ref 140–400)
RBC: 4.94 10*6/uL (ref 4.20–5.80)
RDW: 13.1 % (ref 11.0–15.0)
Total Lymphocyte: 22.9 %
WBC: 10.3 10*3/uL (ref 3.8–10.8)

## 2021-10-24 LAB — LIPID PANEL
Cholesterol: 171 mg/dL (ref <200)
HDL: 62 mg/dL (ref >=40)
LDL Cholesterol (Calc): 79 mg/dL (calc)
Non-HDL Cholesterol (Calc): 109 mg/dL (calc) (ref <130)
Total CHOL/HDL Ratio: 2.8 (calc) (ref <5.0)
Triglycerides: 210 mg/dL — ABNORMAL HIGH (ref <150)

## 2021-10-24 LAB — COMPLETE METABOLIC PANEL WITH GFR
AG Ratio: 1.7 (calc) (ref 1.0–2.5)
ALT: 19 U/L (ref 9–46)
AST: 15 U/L (ref 10–35)
Albumin: 4.2 g/dL (ref 3.6–5.1)
Alkaline phosphatase (APISO): 58 U/L (ref 35–144)
BUN: 14 mg/dL (ref 7–25)
CO2: 31 mmol/L (ref 20–32)
Calcium: 10.1 mg/dL (ref 8.6–10.3)
Chloride: 104 mmol/L (ref 98–110)
Creat: 0.73 mg/dL (ref 0.70–1.28)
Globulin: 2.5 g/dL (calc) (ref 1.9–3.7)
Glucose, Bld: 140 mg/dL — ABNORMAL HIGH (ref 65–99)
Potassium: 4.4 mmol/L (ref 3.5–5.3)
Sodium: 142 mmol/L (ref 135–146)
Total Bilirubin: 0.5 mg/dL (ref 0.2–1.2)
Total Protein: 6.7 g/dL (ref 6.1–8.1)
eGFR: 97 mL/min/{1.73_m2} (ref >=60)

## 2021-10-24 LAB — TEST AUTHORIZATION

## 2021-10-24 LAB — HEMOGLOBIN A1C W/OUT EAG: Hgb A1c MFr Bld: 5.5 % of total Hgb (ref <5.7)

## 2021-10-26 ENCOUNTER — Ambulatory Visit
Admission: RE | Admit: 2021-10-26 | Discharge: 2021-10-26 | Disposition: A | Discharge status: Home / Self Care | Payer: Medicare HMO | Source: Ambulatory Visit | Attending: Family Medicine | Admitting: Family Medicine

## 2021-10-26 DIAGNOSIS — Z136 Encounter for screening for cardiovascular disorders: Secondary | ICD-10-CM

## 2021-10-26 DIAGNOSIS — Z87891 Personal history of nicotine dependence: Secondary | ICD-10-CM | POA: Diagnosis not present

## 2021-10-29 DIAGNOSIS — C61 Malignant neoplasm of prostate: Secondary | ICD-10-CM | POA: Diagnosis not present

## 2021-10-29 DIAGNOSIS — R350 Frequency of micturition: Secondary | ICD-10-CM | POA: Diagnosis not present

## 2021-10-29 DIAGNOSIS — N401 Enlarged prostate with lower urinary tract symptoms: Secondary | ICD-10-CM | POA: Diagnosis not present

## 2021-11-06 DIAGNOSIS — M9901 Segmental and somatic dysfunction of cervical region: Secondary | ICD-10-CM | POA: Diagnosis not present

## 2021-11-06 DIAGNOSIS — M5136 Other intervertebral disc degeneration, lumbar region: Secondary | ICD-10-CM | POA: Diagnosis not present

## 2021-11-06 DIAGNOSIS — M9903 Segmental and somatic dysfunction of lumbar region: Secondary | ICD-10-CM | POA: Diagnosis not present

## 2021-11-06 DIAGNOSIS — M5033 Other cervical disc degeneration, cervicothoracic region: Secondary | ICD-10-CM | POA: Diagnosis not present

## 2021-11-27 DIAGNOSIS — M5033 Other cervical disc degeneration, cervicothoracic region: Secondary | ICD-10-CM | POA: Diagnosis not present

## 2021-11-27 DIAGNOSIS — M9903 Segmental and somatic dysfunction of lumbar region: Secondary | ICD-10-CM | POA: Diagnosis not present

## 2021-11-27 DIAGNOSIS — M9901 Segmental and somatic dysfunction of cervical region: Secondary | ICD-10-CM | POA: Diagnosis not present

## 2021-11-27 DIAGNOSIS — M5136 Other intervertebral disc degeneration, lumbar region: Secondary | ICD-10-CM | POA: Diagnosis not present

## 2021-12-14 ENCOUNTER — Telehealth: Payer: Self-pay

## 2021-12-14 ENCOUNTER — Other Ambulatory Visit: Payer: Self-pay | Admitting: Family Medicine

## 2021-12-14 DIAGNOSIS — Z1211 Encounter for screening for malignant neoplasm of colon: Secondary | ICD-10-CM

## 2021-12-14 NOTE — Telephone Encounter (Signed)
Pt called stating MD at [facility] wants the patient to have a colonoscopy at [facility] but he would have to do a "screening" before. Pt states he doesn't want to do that and would like to be set up with a local MD to have done at an endoscopy center. Is this feasible?

## 2021-12-27 ENCOUNTER — Telehealth: Payer: Self-pay | Admitting: Gastroenterology

## 2021-12-27 NOTE — Telephone Encounter (Signed)
Good afternoon Dr. Silverio Decamp,   Patient called stating that he had a referral in from his PCP Dr. Dennard Schaumann for a colonoscopy. Patient has been seen by you  in the past and then was seen by [facility] in 2018 as well for a colonoscopy. Patient record from his procedure is available in epic, will you please advise and advise on scheduling?  Thank you

## 2021-12-28 ENCOUNTER — Telehealth: Payer: Self-pay | Admitting: Family Medicine

## 2021-12-28 MED ORDER — CARVEDILOL 6.25 MG PO TABS
6.2500 mg | ORAL_TABLET | Freq: Two times a day (BID) | ORAL | 1 refills | Status: DC
Start: 2021-12-28 — End: 2022-06-21

## 2021-12-28 NOTE — Telephone Encounter (Signed)
Received eFax from pharmacy to request refill of or new script for   carvedilol (COREG) 6.25 MG tablet [[id number]]   Pharmacy:   [id number] [place], [place] [id number] Phone:  [phone] Fax:  [phone] DEA #:  [id number] LOV: 10/19/2021 DATE LAST FILLED: 09/28/21  Please advise pharmacist at [phone] Fax: [phone]

## 2022-01-01 NOTE — Telephone Encounter (Signed)
Please schedule next available appointment for direct colonoscopy for surveillance if he has no GI symptoms.  Thank you

## 2022-01-04 ENCOUNTER — Encounter: Payer: Self-pay | Admitting: Physician Assistant

## 2022-01-04 NOTE — Telephone Encounter (Signed)
Patient was scheduled for an OV for 11/22 at 9:30 with Vicie Mutters due to patient being on Plavix.

## 2022-01-15 ENCOUNTER — Ambulatory Visit (INDEPENDENT_AMBULATORY_CARE_PROVIDER_SITE_OTHER): Payer: Medicare HMO

## 2022-01-15 DIAGNOSIS — Z23 Encounter for immunization: Secondary | ICD-10-CM | POA: Diagnosis not present

## 2022-01-15 NOTE — Progress Notes (Signed)
flu

## 2022-01-30 DIAGNOSIS — H401131 Primary open-angle glaucoma, bilateral, mild stage: Secondary | ICD-10-CM | POA: Diagnosis not present

## 2022-02-05 ENCOUNTER — Encounter: Payer: Self-pay | Admitting: Nurse Practitioner

## 2022-02-05 ENCOUNTER — Telehealth: Payer: Self-pay

## 2022-02-05 ENCOUNTER — Ambulatory Visit: Payer: Medicare HMO | Admitting: Nurse Practitioner

## 2022-02-05 VITALS — BP 146/82 | HR 63 | Ht 69.0 in | Wt 224.5 lb

## 2022-02-05 DIAGNOSIS — Z86010 Personal history of colonic polyps: Secondary | ICD-10-CM

## 2022-02-05 DIAGNOSIS — I2510 Atherosclerotic heart disease of native coronary artery without angina pectoris: Secondary | ICD-10-CM

## 2022-02-05 MED ORDER — NA SULFATE-K SULFATE-MG SULF 17.5-3.13-1.6 GM/177ML PO SOLN: 1.0000 | Freq: Once | ORAL | 0 refills | Status: AC

## 2022-02-05 NOTE — Patient Instructions (Addendum)
You have been scheduled for a colonoscopy. Please follow written instructions given to you at your visit today.  Please pick up your prep supplies at the pharmacy within the next 1-3 days. If you use inhalers (even only as needed), please bring them with you on the day of your procedure.  Due to recent changes in healthcare laws, you may see the results of your imaging and laboratory studies on MyChart before your provider has had a chance to review them.  We understand that in some cases there may be results that are confusing or concerning to you. Not all laboratory results come back in the same time frame and the provider may be waiting for multiple results in order to interpret others.  Please give us 48 hours in order for your provider to thoroughly review all the results before contacting the office for clarification of your results.   Thank you for trusting me with your gastrointestinal care!   Colleen Kennedy-Smith, CRNP   

## 2022-02-05 NOTE — Telephone Encounter (Signed)
    Primary Cardiologist: Dr. Quay Burow  Chart reviewed as part of pre-operative protocol coverage. Because of Todd Patterson's past medical history and time since last visit, he/she will require a follow-up visit in order to better assess preoperative cardiovascular risk.  Pre-op covering staff: - Please schedule appointment and call patient to inform them. - Please contact requesting surgeon's office via preferred method (i.e, phone, fax) to inform them of need for appointment prior to surgery.  If applicable, this message will also be routed to pharmacy pool and/or primary cardiologist for input on holding anticoagulant/antiplatelet agent as requested below so that this information is available at time of patient's appointment.   Deberah Pelton, NP  02/05/2022, 11:43 AM

## 2022-02-05 NOTE — Telephone Encounter (Signed)
Left message to call back and schedule an appt in the office with Dr. Gwenlyn Found or APP for pre op clearance.

## 2022-02-05 NOTE — Telephone Encounter (Signed)
[facility] Pre-operative Risk Assessment     Request for surgical clearance:     Endoscopy Procedure  What type of surgery is being performed?     Colonoscopy  When is this surgery scheduled?     03/25/22  What type of clearance is required ?   Pharmacy  Are there any medications that need to be held prior to surgery and how long? Plavix & 5 days  Practice name and name of physician performing surgery?      [facility]  What is your office phone and fax number?      Phone- [phone]  [phone]  Anesthesia type (None, local, MAC, general) ?       MAC

## 2022-02-05 NOTE — Progress Notes (Addendum)
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 He presents today as referred by Dr. Jenna Luo  to schedule a colonoscopy.  His initial screening colonoscopy was done by Dr. 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 A repeat colonoscopy was done 08/06/2016 which identified one > 27m tubulovillous polyp removed from the cecum, incomplete resection, one hyperplastic polyp was removed from the rectum and diverticulosis in the sigmoid colon. [facility] and the 355mcecal polyp was removed via mucosal resection and clips were placed. A 3 month follow up colonoscopy 01/29/2017 did not show any evidence of residual TV polyp to the cecum and 8 or 9 tubular adenomatous polyps were removed from the colon. He was advised to repeat a colonoscopy in one year which was not done. He was evaluated by genetic counselor 06/2021.   He presented to our office today, one day early, as his appointment was scheduled on 02/06/2022. However, we were able accommodate his wish to be seen today. He denies having any abdominal pain. He is passing a normal formed brown stool daily. No rectal bleeding or black stools. No GERD symptoms. He has a history of CAD s/p DES in 2010 on Plavix. He denies having any chest pain, palpitations or SOB. No complaints today.  PAST GI  PROCEDURES:  Colonoscopy 04/24/2016 by Dr. NaSilverio Decamp- One 4 mm polyp at the appendiceal orifice, removed with a cold snare. Resected and retrieved. - Rule out malignancy, 4066molypoid lesion in the cecum. Biopsied. - Four 15 to 20 mm polyps in the sigmoid colon, in the descending colon and in the transverse colon, removed with a hot snare. Resected and retrieved. - Two 7 to 9 mm polyps in the sigmoid colon and in the ascending colon, removed with a cold snare. Resected and retrieved. - Non-bleeding internal hemorrhoids. 1. Colon, polyp(s), cecum, ascending, polyp (2) - TUBULAR ADENOMA (X4 FRAGMENTS). - NO HIGH GRADE DYSPLASIA OR MALIGNANCY. 2. Colon, polyp(s), cecum (large, friable), polyp - MULTIPLE SUPERFICIAL FRAGMENTS OF TUBULOVILLOUS ADENOMA, SEE COMMENT. - NO HIGH GRADE DYSPLASIA OR MALIGNANCY. 3. Colon, polyp(s), transverse, polyp - TUBULAR ADENOMA (X6 FRAGMENTS). - NO HIGH GRADE DYSPLASIA OR MALIGNANCY. 4. Colon, polyp(s), sigmoid and descending, polyp (4) - MULTIPLE FRAGMENTS OF TUBULAR ADENOMA. - NO HIGH GRADE DYSPLASIA OR MALIGNANCY.  Colonoscopy 08/06/2016 by Dr. NanSilverio Decamp Perianal rash found on perianal exam. - One greater than 50 mm polyp in the cecum, removed with a hot snare. Incomplete resection. Resected tissue retrieved. Clip (MR conditional) was placed. Clips (MR conditional) were placed. - One 2 mm polyp in the rectum, removed with a cold biopsy forceps. Resected and retrieved. - Diverticulosis in the sigmoid colon. - Non-bleeding internal hemorrhoids. 1. Surgical [P], cecum, polyp - MULTIPLE FRAGMENTS OF TUBULOVILLOUS ADENOMA. - NO HIGH GRADE DYSPLASIA OR MALIGNANCY. 2. Surgical [P], rectum, polyp - HYPERPLASTIC POLYP (X2 FRAGMENTS). - NO HIGH GRADE DYSPLASIA OR MALIGNANCY.  Colonoscopy  10/17/2016 at [facility]:  69m polyp in the cecum removed with mucosal resection, clips placed Repeat colonoscopy in 3 months  Multiple fragments of villous adenoma. No  high grade dysplasia or carcinoma is seen.  Colonoscopy 01/29/2017 at [facility]: Post polypectomy scare in the cecum, biopsies Eight 1 to 3 mm polyps removed from the descending, transverse, ascending colon and cecum and one 538mpolyp removed from the ascending colon Repeat colonoscopy in 1 year A.  Colon, cecal polypectomy site, endoscopic biopsy:   Colonic mucosa with mild architectural disarray and reactive changes. Negative for residual adenomatous mucosa.   B.  Colon polyps, endoscopic polypectomy:   Tubular adenoma, multiple fragments.   C.  Descending colon nodule, endoscopic biopsy:   Colonic mucosa with no significant pathologic diagnosis   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   neurologist-- dr yaKrista Blue 09-05-2017 acute onset right 6th nerve palsy with ischemic event,  started on plavix;  06/ 2021 left 6th nerve palsy probable same etiology as the right   Anticoagulant long-term use    plavix---  managed by pcp   Arthritis    Colon polyps    Coronary artery disease    cardiologist--- dr beGwenlyn Found2010  hx MI s/p cath with DES x1  [place]    x[place]  both permanent   Family history of breast cancer    Glaucoma, both eyes    History of COVID-19 03/21/2020   result in epic   History of myocardial infarction 2010   PCI w/ DES to LAD   Hx of colonic polyps    colonoscopy 2018, recommended repeat in 1 year due to numbe rof polyps.    Hyperlipidemia    Hypertension    [facility]   [facility] dr gay;  dx 06/ 2022, gleason 3+3, PSA 8.82   Polyposis coli    [place]   S/P drug eluting coronary stent placement 2010   to LAD   Past Surgical History:  Procedure Laterality Date   APPENDECTOMY  1974   age 71 CORONARY ANGIOPLASTY WITH STENT PLACEMENT  2010   [place]  CYSTOSCOPY N/A 01/15/2021   Procedure:  CYSTOSCOPY FLEXIBLE;  Surgeon: GaJanith LimaMD;  [facility];  Laterality: N/A;  No seeds detected in bladder per Dr. GaAbner Greenspan PARATHYROIDECTOMY N/A 07/04/2016   Procedure: PARATHYROIDECTOMY;  Surgeon: ToJackolyn ConferMD;  [facility];  Service: General;  Laterality: N/A;   RADIOACTIVE SEED IMPLANT N/A 01/15/2021   Procedure: RADIOACTIVE SEED IMPLANT/BRACHYTHERAPY IMPLANT;  Surgeon: GaJanith LimaMD;  [facility];  Laterality: N/A;   SPACE OAR INSTILLATION N/A 01/15/2021   Procedure: SPACE OAR INSTILLATION;  Surgeon: GaJanith LimaMD;  [facility];  Laterality: N/A;   TONSILLECTOMY AND ADENOIDECTOMY  1970   age 71 Social History:  He is married. Retired. He has 2 daughters. He reports that he quit smoking about 13 years ago. His smoking use included cigarettes. He has a 84.00 pack-year smoking history. He has never used smokeless tobacco. He reports current alcohol use of about 14.0 - 21.0 standard drinks of alcohol per week. He reports that he does not use drugs.  Family History: family history includes Breast  cancer (age of onset: 41) in his sister; Cancer in his niece and sister; Dementia in his mother; Heart disease in his father; Throat cancer in his nephew. No known family history of colorectal cancer.   No Known Allergies    Outpatient Encounter Medications as of 02/05/2022  Medication Sig   atorvastatin (LIPITOR) 80 MG tablet TAKE 1 TABLET BY MOUTH EVERY DAY   carvedilol (COREG) 6.25 MG tablet Take 1 tablet (6.25 mg total) by mouth 2 (two) times daily with a meal.   clopidogrel (PLAVIX) 75 MG tablet TAKE 1 TABLET (75 MG TOTAL) BY MOUTH DAILY. PLEASE REQUEST FUTURE REFILLS FROM PCP.   dorzolamide-timolol (COSOPT) 22.3-6.8 MG/ML ophthalmic solution SMARTSIG:In Eye(s)   ibuprofen (ADVIL) 200 MG tablet Take 200 mg by mouth every 6 (six) hours as  needed.   latanoprost (XALATAN) 0.005 % ophthalmic solution Place 1 drop into both eyes at bedtime.   losartan (COZAAR) 25 MG tablet TAKE 2 TABLETS BY MOUTH EVERY DAY   tamsulosin (FLOMAX) 0.4 MG CAPS capsule TAKE 1 CAPSULE BY MOUTH EVERY DAY AFTER SUPPER   docusate sodium (COLACE) 100 MG capsule Take 1 capsule (100 mg total) by mouth daily as needed for up to 30 doses. (Patient not taking: Reported on 02/05/2022)   No facility-administered encounter medications on file as of 02/05/2022.    REVIEW OF SYSTEMS:  Gen: Denies fever, sweats or chills. No weight loss.  CV: Denies chest pain, palpitations or edema. Resp: Denies cough, shortness of breath of hemoptysis.  GI: Denies heartburn, dysphagia, stomach or lower abdominal pain. No diarrhea or constipation.  GU : Denies urinary burning, blood in urine, increased urinary frequency or incontinence. MS: Denies joint pain, muscles aches or weakness. Derm: Denies rash, itchiness, skin lesions or unhealing ulcers. Psych: Denies depression, anxiety or memory loss.  Heme: Denies bruising, easy bleeding. Neuro:  Denies headaches, dizziness or paresthesias. Endo:  Denies any problems with DM, thyroid or adrenal function.  PHYSICAL EXAM: BP (!) 146/82   Pulse 63   Ht '5\' 9"'$  (1.753 m)   Wt 224 lb 8 oz (101.8 kg)   BMI 33.15 kg/m  General: 71 year old male in NAD.  Head: Normocephalic and atraumatic. Eyes:  Sclerae non-icteric, conjunctive pink. Ears: Normal auditory acuity. Mouth: Dentition intact. No ulcers or lesions.  Neck: Supple, no lymphadenopathy or thyromegaly. Parathyroidectomy scar intact.  Lungs: Clear bilaterally to auscultation without wheezes, crackles or rhonchi. Heart: Regular rate and rhythm. No murmur, rub or gallop appreciated.  Abdomen: Soft, nontender, non distended. No masses. No hepatosplenomegaly. Normoactive bowel sounds x 4 quadrants. Diastasis recti.  Rectal: Deferred.  Musculoskeletal: Symmetrical with no gross  deformities. Skin: Warm and dry. No rash or lesions on visible extremities. Extremities: No edema. Neurological: Alert oriented x 4, no focal deficits.  Psychological:  Alert and cooperative. Normal mood and affect.  ASSESSMENT AND PLAN:  90) 71 year old male with a history of numerous tubular adenomatous colon polyps and one large cecal tubulovillous polyp removed by mucosal resection at [facility] in 10/2016, three  month follow up colonoscopy 01/2017 did not show any evidence of residual TV polyp to the cecum and 8 or 9 tubular adenomatous polyps were removed from the colon. He was advised to repeat a colonoscopy in one year which was not done.  -Colonoscopy benefits and risks discussed including risk with sedation, risk of bleeding, perforation and infection  -Our office will contact cardiologist Dr. Quay Burow to verify Plavix hold instructions prior to proceeding  with a colonoscopy  ADDENUM: See phone note from cardiology 02/05/2022 which documented the following per Coletta Memos cardiology NP "Because of Gevork Ayyad Huser's past medical history and time since last visit, he/she will require a follow-up visit in order to better assess preoperative cardiovascular risk"  2) Coronary artery disease s/p MI, s/p DES in 2010 on Plavix. He was last seen by his cardiologist Dr. Quay Burow 06/2020. 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 Dennard Schaumann if patient can hold Plavix for 5 days prior to proceeding with a colonoscopy   ADDENDUM: Message from Dr. Dennard Schaumann 02/11/2022 received as follows: [name], MD  Noralyn Pick, NP I am fine with him holding plavix for 5 days prior to procedure, we have been prescribing this due to his history of ischemic 6th cranial nerve palsy. Thanks Tom  ADDENDUM: Cardiac clearance received  03/01/2022 as follows: Preoperative cardiac exam: According to the Revised Cardiac Risk Index (RCRI), his Perioperative Risk of Major Cardiac Event is (%): 0.9. His Functional Capacity in METs is: 8.97 according to the [facility] Activity Status Index (DASI). Therefore, based on ACC/AHA guidelines, patient would be at acceptable risk for the planned procedure without further cardiovascular testing. I will route this recommendation to the requesting party via Epic fax function.    Lenna Sciara, NP      CC:  [name], MD

## 2022-02-06 ENCOUNTER — Ambulatory Visit: Payer: Medicare HMO | Admitting: Physician Assistant

## 2022-02-11 ENCOUNTER — Encounter: Payer: Self-pay | Admitting: Family Medicine

## 2022-02-11 ENCOUNTER — Ambulatory Visit (INDEPENDENT_AMBULATORY_CARE_PROVIDER_SITE_OTHER): Payer: Medicare HMO | Admitting: Family Medicine

## 2022-02-11 VITALS — BP 130/76 | HR 73 | Ht 69.0 in | Wt 226.0 lb

## 2022-02-11 DIAGNOSIS — B354 Tinea corporis: Secondary | ICD-10-CM | POA: Diagnosis not present

## 2022-02-11 MED ORDER — TERBINAFINE HCL 250 MG PO TABS
250.0000 mg | ORAL_TABLET | Freq: Every day | ORAL | 0 refills | Status: DC
Start: 2022-02-11 — End: 2022-02-22

## 2022-02-11 NOTE — Progress Notes (Signed)
Subjective:    Patient ID: Todd CALABRETTA, male    DOB: 12-01-1950, 71 y.o.   MRN: [id number] Patient has had a widespread rash bilaterally on the crease between his scrotum and his upper thigh that has been present for several weeks.  The rash is an erythematous patch with sharp serpiginous borders.  It is itchy.  He has been using Lotrimin spray for jock itch with minimal improvement.  He also has a rash on both gluteal cheeks and his lower back.  The rash consists of multiple green patches of erythema with sharp serpiginous borders.  See photograph below  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   neurologist-- dr Krista Blue;  09-05-2017 acute onset right 6th nerve palsy with ischemic event,  started on plavix;  06/ 2021 left 6th nerve palsy probable same etiology as the right   Anticoagulant long-term use    plavix---  managed by pcp   Arthritis    Colon polyps    Coronary artery disease    cardiologist--- dr Gwenlyn Found; 2010  hx MI s/p cath with DES x1  to LAD per cardiology note done in [place] present    x2 upper  both permanent   Family history of breast cancer    Glaucoma, both eyes    History of COVID-19 03/21/2020   result in epic   History of myocardial infarction 2010   PCI w/ DES to LAD   Hx of colonic polyps    colonoscopy 2018, recommended repeat in 1 year due to numbe rof polyps.    Hyperlipidemia    Hypertension    Malignant neoplasm prostate [facility])    urologist-- dr gay;  dx 06/ 2022, gleason 3+3, PSA 8.82   Polyposis coli    Primary hyperparathyroidism ([place])    S/P drug eluting coronary stent placement 2010   to LAD   Past Surgical History:  Procedure Laterality Date   APPENDECTOMY  1974   age 9   CORONARY ANGIOPLASTY WITH STENT PLACEMENT  2010   DES x1 LAD  (in [place])   CYSTOSCOPY N/A 01/15/2021   Procedure: CYSTOSCOPY FLEXIBLE;  Surgeon: Janith Lima, MD;  Location: [facility];  Service:  Urology;  Laterality: N/A;  No seeds detected in bladder per Dr. Abner Greenspan   PARATHYROIDECTOMY N/A 07/04/2016   Procedure: PARATHYROIDECTOMY;  Surgeon: Jackolyn Confer, MD;  Location: [facility];  Service: General;  Laterality: N/A;   RADIOACTIVE SEED IMPLANT N/A 01/15/2021   Procedure: RADIOACTIVE SEED IMPLANT/BRACHYTHERAPY IMPLANT;  Surgeon: Janith Lima, MD;  Location: [facility];  Service: Urology;  Laterality: N/A;   SPACE OAR INSTILLATION N/A 01/15/2021   Procedure: SPACE OAR INSTILLATION;  Surgeon: Janith Lima, MD;  Location: [facility];  Service: Urology;  Laterality: N/A;   TONSILLECTOMY AND ADENOIDECTOMY  1970   age 3   Current Outpatient Medications on File Prior to Visit  Medication Sig Dispense Refill   atorvastatin (LIPITOR) 80 MG tablet TAKE 1 TABLET BY MOUTH EVERY DAY 90 tablet 3   carvedilol (COREG) 6.25 MG tablet Take 1 tablet (6.25 mg total) by mouth 2 (two) times daily with a meal. 180 tablet 1   clopidogrel (PLAVIX) 75 MG tablet TAKE 1 TABLET (75 MG TOTAL) BY MOUTH DAILY. PLEASE REQUEST FUTURE REFILLS FROM PCP. 90 tablet 3   docusate sodium (COLACE) 100 MG capsule Take 1 capsule (100 mg total) by mouth daily  as needed for up to 30 doses. 30 capsule 0   dorzolamide-timolol (COSOPT) 22.3-6.8 MG/ML ophthalmic solution SMARTSIG:In Eye(s)     ibuprofen (ADVIL) 200 MG tablet Take 200 mg by mouth every 6 (six) hours as needed.     latanoprost (XALATAN) 0.005 % ophthalmic solution Place 1 drop into both eyes at bedtime.     losartan (COZAAR) 25 MG tablet TAKE 2 TABLETS BY MOUTH EVERY DAY 180 tablet 1   tamsulosin (FLOMAX) 0.4 MG CAPS capsule TAKE 1 CAPSULE BY MOUTH EVERY DAY AFTER SUPPER 90 capsule 1   No current facility-administered medications on file prior to visit.   No Known Allergies  Social History   Socioeconomic History   Marital status: Married    Spouse name: Not on file   Number of children: 2   Years of education: Not on file    Highest education level: Not on file  Occupational History   Occupation: retired  Tobacco Use   Smoking status: Former    Packs/day: 2.00    Years: 42.00    Total pack years: 84.00    Types: Cigarettes    Quit date: 2010    Years since quitting: 13.9   Smokeless tobacco: Never  Vaping Use   Vaping Use: Never used  Substance and Sexual Activity   Alcohol use: Yes    Alcohol/week: 14.0 - 21.0 standard drinks of alcohol    Types: 14 - 21 Cans of beer per week    Comment: 01-09-2021  pt stated 2-3 (12oz) beers daily   Drug use: No   Sexual activity: Yes  Other Topics Concern   Not on file  Social History Narrative   Not on file   Social Determinants of Health   Financial Resource Strain: Low Risk  (08/17/2020)   Overall Financial Resource Strain (CARDIA)    Difficulty of Paying Living Expenses: Not hard at all  Food Insecurity: No Food Insecurity (08/17/2020)   Hunger Vital Sign    Worried About Running Out of Food in the Last Year: Never true    Ran Out of Food in the Last Year: Never true  Transportation Needs: No Transportation Needs (08/17/2020)   PRAPARE - Hydrologist (Medical): No    Lack of Transportation (Non-Medical): No  Physical Activity: Inactive (08/17/2020)   Exercise Vital Sign    Days of Exercise per Week: 0 days    Minutes of Exercise per Session: 0 min  Stress: No Stress Concern Present (08/17/2020)   [place]    Feeling of Stress : Not at all  Social Connections: Moderately Integrated (08/17/2020)   Social Connection and Isolation Panel [NHANES]    Frequency of Communication with Friends and Family: More than three times a week    Frequency of Social Gatherings with Friends and Family: More than three times a week    Attends Religious Services: Never    Marine scientist or Organizations: Yes    Attends Music therapist: More than 4 times per year     Marital Status: Married  Human resources officer Violence: Not At Risk (08/17/2020)   Humiliation, Afraid, Rape, and Kick questionnaire    Fear of Current or Ex-Partner: No    Emotionally Abused: No    Physically Abused: No    Sexually Abused: No   Family History  Problem Relation Age of Onset   Dementia Mother    Heart disease  Father    Cancer Sister        d. 31   Breast cancer Sister 60       d. 71   Cancer Niece        NOS, d. 64   Throat cancer Nephew        d. 62      Review of Systems  Skin:  Positive for rash.  Neurological:  Positive for headaches.  All other systems reviewed and are negative.      Objective:   Physical Exam Vitals reviewed.  Constitutional:      General: He is not in acute distress.    Appearance: He is well-developed. He is not diaphoretic.  HENT:     Head: Normocephalic and atraumatic.     Right Ear: External ear normal.     Left Ear: External ear normal.     Nose: Nose normal.     Mouth/Throat:     Pharynx: No oropharyngeal exudate.  Eyes:     Pupils: Pupils are equal, round, and reactive to light.  Neck:     Thyroid: No thyromegaly.     Vascular: No JVD.     Trachea: No tracheal deviation.  Cardiovascular:     Rate and Rhythm: Normal rate and regular rhythm.     Heart sounds: Normal heart sounds. No murmur heard.    No friction rub. No gallop.  Pulmonary:     Effort: Pulmonary effort is normal. No respiratory distress.     Breath sounds: Normal breath sounds. No stridor. No wheezing or rales.  Chest:     Chest wall: No tenderness.  Abdominal:     General: Bowel sounds are normal. There is no distension.     Palpations: Abdomen is soft. There is no mass.     Tenderness: There is no abdominal tenderness. There is no guarding or rebound.  Musculoskeletal:        General: No tenderness or deformity. Normal range of motion.     Cervical back: Normal range of motion and neck supple.  Lymphadenopathy:     Cervical: No cervical  adenopathy.  Skin:    General: Skin is warm.     Coloration: Skin is not pale.     Findings: Rash present. No erythema. Rash is macular.       Neurological:     Mental Status: He is alert and oriented to person, place, and time.     Cranial Nerves: No cranial nerve deficit.     Sensory: No sensory deficit.     Motor: No tremor, atrophy or abnormal muscle tone.     Coordination: Coordination normal.     Gait: Gait normal.     Deep Tendon Reflexes: Reflexes are normal and symmetric.  Psychiatric:        Behavior: Behavior normal.        Thought Content: Thought content normal.        Judgment: Judgment normal.           Assessment & Plan:  Tinea corporis Patient has been using a Lotrimin cream and spray type product now for more than a week with minimal success.  Will trial oral Lamisil 250 mg daily for 2 weeks for extensive tinea corporis.

## 2022-02-11 NOTE — Telephone Encounter (Signed)
Pt has appt 03/01/22 with [name], NP

## 2022-02-14 NOTE — Progress Notes (Signed)
Todd Patterson, pls contact patient and let him know his PCP Dr. 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   Patient should still proceed with his cardiology follow up 03/01/2022 as requested by his cardiology team.  [facility]

## 2022-02-15 ENCOUNTER — Telehealth: Payer: Self-pay

## 2022-02-15 NOTE — Telephone Encounter (Signed)
Left message for pt to call back  °

## 2022-02-15 NOTE — Telephone Encounter (Signed)
Routed Note  Author: Noralyn Pick, NP Service: Gastroenterology Author Type: Nurse Practitioner  Filed: 02/14/2022 11:46 AM Encounter Date: 02/05/2022 Status: Signed  Editor: Noralyn Pick, NP (Nurse Practitioner)   Remo Lipps, pls contact patient and let him know his PCP Dr. 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    Patient should still proceed with his cardiology follow up 03/01/2022 as requested by his cardiology team.   [facility]

## 2022-02-18 NOTE — Telephone Encounter (Signed)
Patient returning your call.

## 2022-02-18 NOTE — Telephone Encounter (Signed)
Pt notified that  his PCP Dr. Alvino Chapel verified it was ok for him to hold his Plavix for 5 days prior to his colonoscopy date.    Patient notified that he  should still proceed with his cardiology follow up 03/01/2022 as requested by his cardiology team.  Pt verbalized understanding with all questions answered.

## 2022-02-22 ENCOUNTER — Other Ambulatory Visit: Payer: Self-pay | Admitting: Family Medicine

## 2022-02-22 ENCOUNTER — Telehealth: Payer: Self-pay

## 2022-02-22 MED ORDER — TERBINAFINE HCL 250 MG PO TABS
250.0000 mg | ORAL_TABLET | Freq: Every day | ORAL | 0 refills | Status: DC
Start: 2022-02-22 — End: 2022-12-26

## 2022-02-22 NOTE — Telephone Encounter (Signed)
Pt called and states that the medication he has been taking for jock itch is helping but it is not completely gone. Pt asks if he can get a refill? Thank you.

## 2022-03-01 ENCOUNTER — Encounter: Payer: Self-pay | Admitting: Nurse Practitioner

## 2022-03-01 ENCOUNTER — Ambulatory Visit: Payer: Medicare HMO | Attending: Nurse Practitioner | Admitting: Nurse Practitioner

## 2022-03-01 VITALS — BP 138/88 | HR 78 | Ht 70.0 in | Wt 225.6 lb

## 2022-03-01 DIAGNOSIS — E785 Hyperlipidemia, unspecified: Secondary | ICD-10-CM

## 2022-03-01 DIAGNOSIS — I2510 Atherosclerotic heart disease of native coronary artery without angina pectoris: Secondary | ICD-10-CM | POA: Diagnosis not present

## 2022-03-01 DIAGNOSIS — Z01810 Encounter for preprocedural cardiovascular examination: Secondary | ICD-10-CM

## 2022-03-01 DIAGNOSIS — I10 Essential (primary) hypertension: Secondary | ICD-10-CM

## 2022-03-01 NOTE — Progress Notes (Signed)
Office Visit    Patient Name: Todd Patterson Date of Encounter: 03/01/2022  Primary Care Provider:  [name], MD Primary Cardiologist:  Quay Burow, MD  Chief Complaint    71 year old male with a history of CAD s/p DES-LAD in 2010, hypertension, hyperlipidemia, and former tobacco use who presents for follow-up related to CAD and for preoperative cardiac evaluation.  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   neurologist-- dr Krista Blue;  09-05-2017 acute onset right 6th nerve palsy with ischemic event,  started on plavix;  06/ 2021 left 6th nerve palsy probable same etiology as the right   Anticoagulant long-term use    plavix---  managed by pcp   Arthritis    Colon polyps    Coronary artery disease    cardiologist--- dr Gwenlyn Found; 2010  hx MI s/p cath with DES x1  to LAD per cardiology note done in [place] present    x2 upper  both permanent   Family history of breast cancer    Glaucoma, both eyes    History of COVID-19 03/21/2020   result in epic   History of myocardial infarction 2010   PCI w/ DES to LAD   Hx of colonic polyps    colonoscopy 2018, recommended repeat in 1 year due to numbe rof polyps.    Hyperlipidemia    Hypertension    Malignant neoplasm prostate [facility])    urologist-- dr gay;  dx 06/ 2022, gleason 3+3, PSA 8.82   Polyposis coli    Primary hyperparathyroidism ([place])    S/P drug eluting coronary stent placement 2010   to LAD   Past Surgical History:  Procedure Laterality Date   APPENDECTOMY  1974   age 53   CORONARY ANGIOPLASTY WITH STENT PLACEMENT  2010   DES x1 LAD  (in [place])   CYSTOSCOPY N/A 01/15/2021   Procedure: CYSTOSCOPY FLEXIBLE;  Surgeon: Janith Lima, MD;  Location: [facility];  Service: Urology;  Laterality: N/A;  No seeds detected in bladder per Dr. Abner Greenspan   PARATHYROIDECTOMY N/A 07/04/2016   Procedure: PARATHYROIDECTOMY;  Surgeon: Jackolyn Confer, MD;  Location: [facility];  Service: General;  Laterality: N/A;   RADIOACTIVE SEED IMPLANT N/A 01/15/2021   Procedure: RADIOACTIVE SEED IMPLANT/BRACHYTHERAPY IMPLANT;  Surgeon: Janith Lima, MD;  Location[facility];  Service: Urology;  Laterality: N/A;   SPACE OAR INSTILLATION N/A 01/15/2021   Procedure: SPACE OAR INSTILLATION;  Surgeon: Janith Lima, MD;  Location: [facility];  Service: Urology;  Laterality: N/A;   TONSILLECTOMY AND ADENOIDECTOMY  1970   age 43    Allergies  No Known Allergies  History of Present Illness    71 year old male with the above past medical history including CAD s/p DES-LAD in 2010, hypertension, hyperlipidemia, and former tobacco use.  He has a history of MI in 2010 with stenting to his LAD in [place].  Stress echo on 2012 was normal.  He relocated from [place] to [place] to be closer to family.  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  He was last seen in the office on 07/12/2020 and was stable from a cardiac standpoint.  He denied symptoms concerning for angina.   He presents today for follow-up and for preoperative cardiac evaluation for colonoscopy scheduled for 03/25/2022 with  [facility].  Since his last visit he has done well from a cardiac standpoint.  He is active and plays golf at least 3 times a week.  He denies symptoms concerning for angina.  Overall, he reports feeling well.  Home Medications    Current Outpatient Medications  Medication Sig Dispense Refill   atorvastatin (LIPITOR) 80 MG tablet TAKE 1 TABLET BY MOUTH EVERY DAY 90 tablet 3   carvedilol (COREG) 6.25 MG tablet Take 1 tablet (6.25 mg total) by mouth 2 (two) times daily with a meal. 180 tablet 1   clopidogrel (PLAVIX) 75 MG tablet TAKE 1 TABLET (75 MG TOTAL) BY MOUTH DAILY. PLEASE REQUEST FUTURE REFILLS FROM PCP. 90 tablet 3   docusate sodium (COLACE) 100 MG capsule  Take 1 capsule (100 mg total) by mouth daily as needed for up to 30 doses. 30 capsule 0   dorzolamide-timolol (COSOPT) 22.3-6.8 MG/ML ophthalmic solution SMARTSIG:In Eye(s)     ibuprofen (ADVIL) 200 MG tablet Take 200 mg by mouth every 6 (six) hours as needed.     latanoprost (XALATAN) 0.005 % ophthalmic solution Place 1 drop into both eyes at bedtime.     losartan (COZAAR) 25 MG tablet TAKE 2 TABLETS BY MOUTH EVERY DAY 180 tablet 1   tamsulosin (FLOMAX) 0.4 MG CAPS capsule TAKE 1 CAPSULE BY MOUTH EVERY DAY AFTER SUPPER 90 capsule 1   terbinafine (LAMISIL) 250 MG tablet Take 1 tablet (250 mg total) by mouth daily. 14 tablet 0   No current facility-administered medications for this visit.     Review of Systems    He denies chest pain, palpitations, dyspnea, pnd, orthopnea, n, v, dizziness, syncope, edema, weight gain, or early satiety. All other systems reviewed and are otherwise negative except as noted above.   Physical Exam    VS:  BP 138/88   Pulse 78   Ht '5\' 10"'$  (1.778 m)   Wt 225 lb 9.6 oz (102.3 kg)   SpO2 92%   BMI 32.37 kg/m  GEN: Well nourished, well developed, in no acute distress. HEENT: normal. Neck: Supple, no JVD, carotid bruits, or masses. Cardiac: RRR, no murmurs, rubs, or gallops. No clubbing, cyanosis, edema.  Radials/DP/PT 2+ and equal bilaterally.  Respiratory:  Respirations regular and unlabored, clear to auscultation bilaterally. GI: Soft, nontender, nondistended, BS + x 4. MS: no deformity or atrophy. Skin: warm and dry, no rash. Neuro:  Strength and sensation are intact. Psych: Normal affect.  Accessory Clinical Findings    ECG personally reviewed by me today - NSR, 78 bpm - no acute changes.   Lab Results  Component Value Date   WBC 10.3 10/19/2021   HGB 14.9 10/19/2021   HCT 44.1 10/19/2021   MCV 89.3 10/19/2021   PLT 245 10/19/2021   Lab Results  Component Value Date   CREATININE 0.73 10/19/2021   BUN 14 10/19/2021   NA 142 10/19/2021   K  4.4 10/19/2021   CL 104 10/19/2021   CO2 31 10/19/2021   Lab Results  Component Value Date   ALT 19 10/19/2021   AST 15 10/19/2021   ALKPHOS 55 01/11/2021   BILITOT 0.5 10/19/2021   Lab Results  Component Value Date   CHOL 171 10/19/2021   HDL 62 10/19/2021   LDLCALC 79 10/19/2021   TRIG 210 (H) 10/19/2021   CHOLHDL 2.8 10/19/2021    Lab Results  Component Value Date   HGBA1C 5.5 10/19/2021    Assessment & Plan   1.  CAD: S/p DES-LAD in 2010. Stable with no anginal symptoms. No indication for ischemic evaluation.  Continue carvedilol, losartan, Lipitor.  2. Hypertension: BP well controlled. Continue current antihypertensive regimen.   3. Hyperlipidemia: LDL  was 79 in 10/2021. Monitored and managed per PCP. Could consider future addition of Zetia.  Continue Lipitor.  4. 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  5. Preoperative cardiac exam: According to the Revised Cardiac Risk Index (RCRI), his Perioperative Risk of Major Cardiac Event is (%): 0.9. His Functional Capacity in METs is: 8.97 according to the Duke Activity Status Index (DASI). Therefore, based on ACC/AHA guidelines, patient would be at acceptable risk for the planned procedure without further cardiovascular testing. I will route this recommendation to the requesting party via Epic fax function.  6. Disposition: Follow-up in 1 year with Dr. Gwenlyn Found.      Lenna Sciara, NP 03/01/2022, 9:51 AM

## 2022-03-01 NOTE — Patient Instructions (Signed)
Medication Instructions:  Your physician recommends that you continue on your current medications as directed. Please refer to the Current Medication list given to you today.   *If you need a refill on your cardiac medications before your next appointment, please call your pharmacy*   Lab Work: NONE ordered at this time of appointment   If you have labs (blood work) drawn today and your tests are completely normal, you will receive your results only by: [facility] (if you have MyChart) OR A paper copy in the mail If you have any lab test that is abnormal or we need to change your treatment, we will call you to review the results.   Testing/Procedures: NONE ordered at this time of appointment     Follow-Up: At [facility], you and your health needs are our priority.  As part of our continuing mission to provide you with exceptional heart care, we have created designated Provider Care Teams.  These Care Teams include your primary Cardiologist (physician) and Advanced Practice Providers (APPs -  Physician Assistants and Nurse Practitioners) who all work together to provide you with the care you need, when you need it.  We recommend signing up for the patient portal called "MyChart".  Sign up information is provided on this After Visit Summary.  MyChart is used to connect with patients for Virtual Visits (Telemedicine).  Patients are able to view lab/test results, encounter notes, upcoming appointments, etc.  Non-urgent messages can be sent to your provider as well.   To learn more about what you can do with MyChart, go to [email or web address].    Your next appointment:   1 year(s)  The format for your next appointment:   In Person  Provider:   Quay Burow, MD     Other Instructions   Important Information About Sugar

## 2022-03-04 NOTE — Telephone Encounter (Signed)
Called patient and patient was aware to hold Plavix 5 days prior to his procedure.

## 2022-03-17 ENCOUNTER — Other Ambulatory Visit: Payer: Self-pay | Admitting: Family Medicine

## 2022-03-19 ENCOUNTER — Encounter: Payer: Self-pay | Admitting: Gastroenterology

## 2022-03-19 NOTE — Telephone Encounter (Signed)
Requested medication (s) are due for refill today:   Yes  Requested medication (s) are on the active medication list:   Yes  Future visit scheduled:   No   Last CPE 10/19/2021   Last ordered: 10/03/2021 #180, 1 refill  Returned because could not determine if Dr. Dennard Schaumann wants to see him every 6 months (per protocol) or yearly.      Requested Prescriptions  Pending Prescriptions Disp Refills   losartan (COZAAR) 25 MG tablet [Pharmacy Med Name: LOSARTAN POTASSIUM 25 MG TAB] 180 tablet 1    Sig: TAKE 2 TABLETS BY MOUTH EVERY DAY     Cardiovascular:  Angiotensin Receptor Blockers Failed - 03/17/2022  9:24 AM      Failed - Valid encounter within last 6 months    Recent Outpatient Visits           2 years ago Essential hypertension   [place], Todd T, MD   2 years ago 6th nerve palsy, left   [place] Pickard, Cammie Mcgee, MD   3 years ago Chronic pain of right knee   [place] [name], MD   3 years ago Acute pain of right knee   [place] [name], MD   4 years ago Upper airway cough syndrome   [place] Pickard, Cammie Mcgee, MD              Passed - Cr in normal range and within 180 days    Creat  Date Value Ref Range Status  10/19/2021 0.73 0.70 - 1.28 mg/dL Final         Passed - K in normal range and within 180 days    Potassium  Date Value Ref Range Status  10/19/2021 4.4 3.5 - 5.3 mmol/L Final         Passed - Patient is not pregnant      Passed - Last BP in normal range    BP Readings from Last 1 Encounters:  03/01/22 138/88

## 2022-03-21 ENCOUNTER — Other Ambulatory Visit: Payer: Self-pay | Admitting: Family Medicine

## 2022-03-21 NOTE — Telephone Encounter (Signed)
OV-10/19/21 Rx for both 04/27/21 #90 3RF- too soon Requested Prescriptions  Pending Prescriptions Disp Refills   atorvastatin (LIPITOR) 80 MG tablet [Pharmacy Med Name: ATORVASTATIN 80 MG TABLET] 90 tablet 3    Sig: TAKE 1 TABLET BY MOUTH EVERY DAY     Cardiovascular:  Antilipid - Statins Failed - 03/21/2022 12:24 PM      Failed - Valid encounter within last 12 months    Recent Outpatient Visits           2 years ago Essential hypertension   [place], Warren T, MD   2 years ago 6th nerve palsy, left   [place] Pickard, Cammie Mcgee, MD   3 years ago Chronic pain of right knee   [place] [name], MD   3 years ago Acute pain of right knee   [place] Dennard Schaumann, Cammie Mcgee, MD   4 years ago Upper airway cough syndrome   [place] Dennard Schaumann, Cammie Mcgee, MD              Failed - Lipid Panel in normal range within the last 12 months    Cholesterol, Total  Date Value Ref Range Status  07/08/2019 192 100 - 199 mg/dL Final   Cholesterol  Date Value Ref Range Status  10/19/2021 171 <200 mg/dL Final   LDL Cholesterol (Calc)  Date Value Ref Range Status  10/19/2021 79 mg/dL (calc) Final    Comment:    Reference range: <100 . Desirable range <100 mg/dL for primary prevention;   <70 mg/dL for patients with CHD or diabetic patients  with > or = 2 CHD risk factors. Marland Kitchen LDL-C is now calculated using the Martin-Hopkins  calculation, which is a validated novel method providing  better accuracy than the Friedewald equation in the  estimation of LDL-C.  Cresenciano Genre et al. Annamaria Helling. 4010;272(53): 2061-2068  (http://education.Quest[employer]ostics.com/faq/FAQ164)    HDL  Date Value Ref Range Status  10/19/2021 62 > OR = 40 mg/dL Final  07/08/2019 65 >39 mg/dL Final   Triglycerides  Date Value Ref Range Status  10/19/2021 210 (H) <150 mg/dL Final    Comment:    . If a non-fasting specimen  was collected, consider repeat triglyceride testing on a fasting specimen if clinically indicated.  Yates Decamp et al. J. of Clin. Lipidol. 6644;0:347-425. Marland Kitchen          Passed - Patient is not pregnant       clopidogrel (PLAVIX) 75 MG tablet [Pharmacy Med Name: CLOPIDOGREL 75 MG TABLET] 90 tablet 3    Sig: TAKE 1 TABLET (75 MG TOTAL) BY MOUTH DAILY. PLEASE REQUEST FUTURE REFILLS FROM PCP.     Hematology: Antiplatelets - clopidogrel Failed - 03/21/2022 12:24 PM      Failed - Valid encounter within last 6 months    Recent Outpatient Visits           2 years ago Essential hypertension   [place], Cammie Mcgee, MD   2 years ago 6th nerve palsy, left   [place] Dennard Schaumann, Cammie Mcgee, MD   3 years ago Chronic pain of right knee   [place] [name], MD   3 years ago Acute pain of right knee   [place] Dennard Schaumann Cammie Mcgee, MD   4 years ago Upper airway cough syndrome   [place] [name],  MD              Passed - HCT in normal range and within 180 days    HCT  Date Value Ref Range Status  10/19/2021 44.1 38.5 - 50.0 % Final         Passed - HGB in normal range and within 180 days    Hemoglobin  Date Value Ref Range Status  10/19/2021 14.9 13.2 - 17.1 g/dL Final         Passed - PLT in normal range and within 180 days    Platelets  Date Value Ref Range Status  10/19/2021 245 140 - 400 Thousand/uL Final         Passed - Cr in normal range and within 360 days    Creat  Date Value Ref Range Status  10/19/2021 0.73 0.70 - 1.28 mg/dL Final

## 2022-03-23 ENCOUNTER — Encounter: Payer: Self-pay | Admitting: Certified Registered Nurse Anesthetist

## 2022-03-25 ENCOUNTER — Ambulatory Visit (AMBULATORY_SURGERY_CENTER): Payer: Medicare HMO | Admitting: Gastroenterology

## 2022-03-25 ENCOUNTER — Other Ambulatory Visit: Payer: Self-pay | Admitting: Gastroenterology

## 2022-03-25 ENCOUNTER — Encounter: Payer: Self-pay | Admitting: Gastroenterology

## 2022-03-25 VITALS — BP 133/76 | HR 55 | Temp 98.6°F | Resp 14 | Ht 69.0 in | Wt 224.0 lb

## 2022-03-25 DIAGNOSIS — D120 Benign neoplasm of cecum: Secondary | ICD-10-CM

## 2022-03-25 DIAGNOSIS — D125 Benign neoplasm of sigmoid colon: Secondary | ICD-10-CM | POA: Diagnosis not present

## 2022-03-25 DIAGNOSIS — Z86010 Personal history of colonic polyps: Secondary | ICD-10-CM | POA: Diagnosis not present

## 2022-03-25 DIAGNOSIS — I2510 Atherosclerotic heart disease of native coronary artery without angina pectoris: Secondary | ICD-10-CM | POA: Diagnosis not present

## 2022-03-25 DIAGNOSIS — D123 Benign neoplasm of transverse colon: Secondary | ICD-10-CM

## 2022-03-25 DIAGNOSIS — D122 Benign neoplasm of ascending colon: Secondary | ICD-10-CM

## 2022-03-25 DIAGNOSIS — I10 Essential (primary) hypertension: Secondary | ICD-10-CM | POA: Diagnosis not present

## 2022-03-25 DIAGNOSIS — D124 Benign neoplasm of descending colon: Secondary | ICD-10-CM

## 2022-03-25 DIAGNOSIS — Z09 Encounter for follow-up examination after completed treatment for conditions other than malignant neoplasm: Secondary | ICD-10-CM

## 2022-03-25 MED ORDER — SODIUM CHLORIDE 0.9 % IV SOLN: 500.0000 mL | Freq: Once | INTRAVENOUS | Status: DC

## 2022-03-25 NOTE — Progress Notes (Unsigned)
[facility] History and Physical   Primary Care Physician:  [name], MD   Reason for Procedure:  History of adenomatous colon polyps  Plan:    Surveillance colonoscopy with possible interventions as needed     HPI: Todd Patterson is a very pleasant 72 y.o. male here for surveillance colonoscopy. Denies any nausea, vomiting, abdominal pain, melena or bright red blood per rectum  The risks and benefits as well as alternatives of endoscopic procedure(s) have been discussed and reviewed. All questions answered. The patient agrees to proceed.    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   neurologist-- dr Krista Blue;  09-05-2017 acute onset right 6th nerve palsy with ischemic event,  started on plavix;  06/ 2021 left 6th nerve palsy probable same etiology as the right   Anticoagulant long-term use    plavix---  managed by pcp   Arthritis    Colon polyps    Coronary artery disease    cardiologist--- dr Gwenlyn Found; 2010  hx MI s/p cath with DES x1  to LAD per cardiology note done in [place] present    x2 upper  both permanent   Family history of breast cancer    Glaucoma, both eyes    History of COVID-19 03/21/2020   result in epic   History of myocardial infarction 2010   PCI w/ DES to LAD   Hx of colonic polyps    colonoscopy 2018, recommended repeat in 1 year due to numbe rof polyps.    Hyperlipidemia    Hypertension    Malignant neoplasm prostate [facility])    urologist-- dr gay;  dx 06/ 2022, gleason 3+3, PSA 8.82   Polyposis coli    Primary hyperparathyroidism ([place])    S/P drug eluting coronary stent placement 2010   to LAD    Past Surgical History:  Procedure Laterality Date   APPENDECTOMY  1974   age 20   CORONARY ANGIOPLASTY WITH STENT PLACEMENT  2010   DES x1 LAD  (in [place])   CYSTOSCOPY N/A 01/15/2021   Procedure: CYSTOSCOPY FLEXIBLE;  Surgeon: Janith Lima, MD;  Location[facility];  Service: Urology;  Laterality: N/A;  No seeds detected in bladder per Dr. Abner Greenspan   PARATHYROIDECTOMY N/A 07/04/2016   Procedure: PARATHYROIDECTOMY;  Surgeon: Jackolyn Confer, MD;  Location: [facility];  Service: General;  Laterality: N/A;   RADIOACTIVE SEED IMPLANT N/A 01/15/2021   Procedure: RADIOACTIVE SEED IMPLANT/BRACHYTHERAPY IMPLANT;  Surgeon: Janith Lima, MD;  [facility];  Service: Urology;  Laterality: N/A;   SPACE OAR INSTILLATION N/A 01/15/2021   Procedure: SPACE OAR INSTILLATION;  Surgeon: Janith Lima, MD;  Location[facility];  Service: Urology;  Laterality: N/A;   TONSILLECTOMY AND ADENOIDECTOMY  1970   age 41    Prior to Admission medications   Medication Sig Start Date End Date Taking? Authorizing Provider  atorvastatin (LIPITOR) 80 MG tablet TAKE 1 TABLET BY MOUTH EVERY DAY 04/27/21  Yes [name], MD  carvedilol (COREG) 6.25 MG tablet Take 1 tablet (6.25 mg total) by mouth 2 (two) times daily with a meal. 12/28/21  Yes [name], MD  dorzolamide-timolol (COSOPT) 22.3-6.8 MG/ML ophthalmic solution SMARTSIG:In Eye(s) 04/23/21  Yes [provider]  latanoprost (XALATAN) 0.005 % ophthalmic solution Place 1 drop into both eyes at bedtime.   Yes [provider]  losartan (COZAAR) 25 MG  tablet TAKE 2 TABLETS BY MOUTH EVERY DAY 03/19/22  Yes [name], MD  tamsulosin (FLOMAX) 0.4 MG CAPS capsule TAKE 1 CAPSULE BY MOUTH EVERY DAY AFTER SUPPER 09/28/21  Yes Tyler Pita, MD  terbinafine (LAMISIL) 250 MG tablet Take 1 tablet (250 mg total) by mouth daily. 02/22/22  Yes [name], MD  clopidogrel (PLAVIX) 75 MG tablet TAKE 1 TABLET (75 MG TOTAL) BY MOUTH DAILY. PLEASE REQUEST FUTURE REFILLS FROM PCP. 04/27/21   [name], MD  docusate sodium (COLACE) 100 MG capsule Take 1 capsule (100 mg total) by mouth daily as needed for up to 30 doses. 01/15/21   Janith Lima, MD  ibuprofen (ADVIL)  200 MG tablet Take 200 mg by mouth every 6 (six) hours as needed.    [provider]    Current Outpatient Medications  Medication Sig Dispense Refill   atorvastatin (LIPITOR) 80 MG tablet TAKE 1 TABLET BY MOUTH EVERY DAY 90 tablet 3   carvedilol (COREG) 6.25 MG tablet Take 1 tablet (6.25 mg total) by mouth 2 (two) times daily with a meal. 180 tablet 1   dorzolamide-timolol (COSOPT) 22.3-6.8 MG/ML ophthalmic solution SMARTSIG:In Eye(s)     latanoprost (XALATAN) 0.005 % ophthalmic solution Place 1 drop into both eyes at bedtime.     losartan (COZAAR) 25 MG tablet TAKE 2 TABLETS BY MOUTH EVERY DAY 180 tablet 1   tamsulosin (FLOMAX) 0.4 MG CAPS capsule TAKE 1 CAPSULE BY MOUTH EVERY DAY AFTER SUPPER 90 capsule 1   terbinafine (LAMISIL) 250 MG tablet Take 1 tablet (250 mg total) by mouth daily. 14 tablet 0   clopidogrel (PLAVIX) 75 MG tablet TAKE 1 TABLET (75 MG TOTAL) BY MOUTH DAILY. PLEASE REQUEST FUTURE REFILLS FROM PCP. 90 tablet 3   docusate sodium (COLACE) 100 MG capsule Take 1 capsule (100 mg total) by mouth daily as needed for up to 30 doses. 30 capsule 0   ibuprofen (ADVIL) 200 MG tablet Take 200 mg by mouth every 6 (six) hours as needed.     Current Facility-Administered Medications  Medication Dose Route Frequency Provider Last Rate Last Admin   0.9 %  sodium chloride infusion  500 mL Intravenous Once Mauri Pole, MD        Allergies as of 03/25/2022   (No Known Allergies)    Family History  Problem Relation Age of Onset   Dementia Mother    Heart disease Father    Cancer Sister        d. 13   Breast cancer Sister 59       d. 61   Cancer Niece        NOS, d. 51   Throat cancer Nephew        d. 38    Social History   Socioeconomic History   Marital status: Married    Spouse name: Not on file   Number of children: 2   Years of education: Not on file   Highest education level: Not on file  Occupational History   Occupation: retired  Tobacco Use    Smoking status: Former    Packs/day: 2.00    Years: 42.00    Total pack years: 84.00    Types: Cigarettes    Quit date: 2010    Years since quitting: 14.0   Smokeless tobacco: Never  Vaping Use   Vaping Use: Never used  Substance and Sexual Activity   Alcohol use: Yes    Alcohol/week:  14.0 - 21.0 standard drinks of alcohol    Types: 14 - 21 Cans of beer per week    Comment: 01-09-2021  pt stated 2-3 (12oz) beers daily   Drug use: No   Sexual activity: Yes  Other Topics Concern   Not on file  Social History Narrative   Not on file   Social Determinants of Health   Financial Resource Strain: Low Risk  (08/17/2020)   Overall Financial Resource Strain (CARDIA)    Difficulty of Paying Living Expenses: Not hard at all  Food Insecurity: No Food Insecurity (08/17/2020)   Hunger Vital Sign    Worried About Running Out of Food in the Last Year: Never true    Ran Out of Food in the Last Year: Never true  Transportation Needs: No Transportation Needs (08/17/2020)   PRAPARE - Hydrologist (Medical): No    Lack of Transportation (Non-Medical): No  Physical Activity: Inactive (08/17/2020)   Exercise Vital Sign    Days of Exercise per Week: 0 days    Minutes of Exercise per Session: 0 min  Stress: No Stress Concern Present (08/17/2020)   [place]    Feeling of Stress : Not at all  Social Connections: Moderately Integrated (08/17/2020)   Social Connection and Isolation Panel [NHANES]    Frequency of Communication with Friends and Family: More than three times a week    Frequency of Social Gatherings with Friends and Family: More than three times a week    Attends Religious Services: Never    Marine scientist or Organizations: Yes    Attends Music therapist: More than 4 times per year    Marital Status: Married  Human resources officer Violence: Not At Risk (08/17/2020)   Humiliation,  Afraid, Rape, and Kick questionnaire    Fear of Current or Ex-Partner: No    Emotionally Abused: No    Physically Abused: No    Sexually Abused: No    Review of Systems:  All other review of systems negative except as mentioned in the HPI.  Physical Exam: Vital signs in last 24 hours: Blood Pressure (Abnormal) 166/74   Pulse 62   Temperature 98.6 F (37 C)   Respiration 12   Height '5\' 9"'$  (1.753 m)   Weight 224 lb (101.6 kg)   Oxygen Saturation 96%   Body Mass Index 33.08 kg/m  General:   Alert, NAD Lungs:  Clear .   Heart:  Regular rate and rhythm Abdomen:  Soft, nontender and nondistended. Neuro/Psych:  Alert and cooperative. Normal mood and affect. A and O x 3  Reviewed labs, radiology imaging, old records and pertinent past GI work up  Patient is appropriate for planned procedure(s) and anesthesia in an ambulatory setting   K. Denzil Magnuson , MD [phone]

## 2022-03-25 NOTE — Patient Instructions (Addendum)
You may restart your plavix in 2 days.  Avoid aspirin, aleve and ibuprofen for 2 weeks.  You will need a repeat colonoscopy in 1 year due to the number of polyps.  You may want to have a sleep study according to your CRNA  ( the person who sedated you.")  YOU HAD AN ENDOSCOPIC PROCEDURE TODAY AT [place]:   Refer to the procedure report that was given to you for any specific questions about what was found during the examination.  If the procedure report does not answer your questions, please call your gastroenterologist to clarify.  If you requested that your care partner not be given the details of your procedure findings, then the procedure report has been included in a sealed envelope for you to review at your convenience later.  YOU SHOULD EXPECT: Some feelings of bloating in the abdomen. Passage of more gas than usual.  Walking can help get rid of the air that was put into your GI tract during the procedure and reduce the bloating. If you had a lower endoscopy (such as a colonoscopy or flexible sigmoidoscopy) you may notice spotting of blood in your stool or on the toilet paper. If you underwent a bowel prep for your procedure, you may not have a normal bowel movement for a few days.  Please Note:  You might notice some irritation and congestion in your nose or some drainage.  This is from the oxygen used during your procedure.  There is no need for concern and it should clear up in a day or so.  SYMPTOMS TO REPORT IMMEDIATELY:  Following lower endoscopy (colonoscopy or flexible sigmoidoscopy):  Excessive amounts of blood in the stool  Significant tenderness or worsening of abdominal pains  Swelling of the abdomen that is new, acute  Fever of 100F or higher  For urgent or emergent issues, a gastroenterologist can be reached at any hour by calling [phone]. Do not use MyChart messaging for urgent concerns.    DIET:  We do recommend a small meal at first, but then  you may proceed to your regular diet.  Drink plenty of fluids but you should avoid alcoholic beverages for 24 hours.  ACTIVITY:  You should plan to take it easy for the rest of today and you should NOT DRIVE or use heavy machinery until tomorrow (because of the sedation medicines used during the test).    FOLLOW UP: Our staff will call the number listed on your records the next business day following your procedure.  We will call around 7:15- 8:00 am to check on you and address any questions or concerns that you may have regarding the information given to you following your procedure. If we do not reach you, we will leave a message.     If any biopsies were taken you will be contacted by phone or by letter within the next 1-3 weeks.  Please call us at [phone] if you have not heard about the biopsies in 3 weeks.    SIGNATURES/CONFIDENTIALITY: You and/or your care partner have signed paperwork which will be entered into your electronic medical record.  These signatures attest to the fact that that the information above on your After Visit Summary has been reviewed and is understood.  Full responsibility of the confidentiality of this discharge information lies with you and/or your care-partner.

## 2022-03-25 NOTE — Progress Notes (Unsigned)
Pt's states no medical or surgical changes since previsit or office visit. 

## 2022-03-25 NOTE — Progress Notes (Unsigned)
Report given to PACU, vss 

## 2022-03-25 NOTE — Op Note (Signed)
[place] Patient Name: Todd Patterson Procedure Date: 03/25/2022 7:35 AM MRN: [id number] Endoscopist: Mauri Pole , MD, [id number] Age: 72 Referring MD:  Date of Birth: 11-May-1950 Gender: Male Account #: [id number] Procedure:                Colonoscopy Indications:              High risk colon cancer surveillance: Personal                            history of colonic polyps Medicines:                Monitored Anesthesia Care Procedure:                Pre-Anesthesia Assessment:                           - Prior to the procedure, a History and Physical                            was performed, and patient medications and                            allergies were reviewed. The patient's tolerance of                            previous anesthesia was also reviewed. The risks                            and benefits of the procedure and the sedation                            options and risks were discussed with the patient.                            All questions were answered, and informed consent                            was obtained. Prior Anticoagulants: The patient                            last took Plavix (clopidogrel) 5 days prior to the                            procedure. ASA Grade Assessment: III - A patient                            with severe systemic disease. After reviewing the                            risks and benefits, the patient was deemed in                            satisfactory condition to undergo the procedure.  After obtaining informed consent, the colonoscope                            was passed under direct vision. Throughout the                            procedure, the patient's blood pressure, pulse, and                            oxygen saturations were monitored continuously. The                            Olympus PCF-[id number]) Colonoscope was                            introduced through the  anus and advanced to the the                            cecum, identified by appendiceal orifice and                            ileocecal valve. The colonoscopy was performed                            without difficulty. The patient tolerated the                            procedure well. The quality of the bowel                            preparation was good. The ileocecal valve,                            appendiceal orifice, and rectum were photographed. Scope In: 8:13:55 AM Scope Out: 8:38:05 AM Scope Withdrawal Time: 0 hours 18 minutes 4 seconds  Total Procedure Duration: 0 hours 24 minutes 10 seconds  Findings:                 The perianal and digital rectal examinations were                            normal.                           A less than 1 mm polyp was found in the ascending                            colon. The polyp was sessile. The polyp was removed                            with a cold biopsy forceps. Resection and retrieval                            were complete.  Ten sessile polyps were found in the sigmoid colon                            X 3, Descending colon X 2, transverse colon X 2,                            ascending colon X 2 and cecum X 1. The polyps were                            3 to 11 mm in size. These polyps were removed with                            a cold snare. Resection were complete, and 9/10                            polyps were retrieved .                           Non-bleeding internal hemorrhoids were found during                            retroflexion. The hemorrhoids were medium-sized. Complications:            No immediate complications. Estimated blood loss:                            Minimal. Impression:               - One less than 1 mm polyp in the ascending colon,                            removed with a cold biopsy forceps. Resected and                            retrieved.                            - Ten 3 to 11 mm polyps in the sigmoid colon, in                            the transverse colon, in the ascending colon and in                            the cecum, removed with a cold snare. Resected and                            retrieved.                           - Non-bleeding internal hemorrhoids. Recommendation:           - Patient has a contact number available for  emergencies. The signs and symptoms of potential                            delayed complications were discussed with the                            patient. Return to normal activities tomorrow.                            Written discharge instructions were provided to the                            patient.                           - Resume previous diet.                           - Continue present medications.                           - Await pathology results.                           - No aspirin, ibuprofen, naproxen, or other                            non-steroidal anti-inflammatory drugs for 2 weeks                            after polyp removal.                           - Resume Plavix (clopidogrel) at prior dose in 2                            days, on 03/27/22. Refer to managing physician for                            further adjustment of therapy.                           - Repeat colonoscopy in 1 year for surveillance                            based on pathology results. Mauri Pole, MD 03/25/2022 8:46:41 AM This report has been signed electronically.

## 2022-03-26 ENCOUNTER — Telehealth: Payer: Self-pay | Admitting: *Deleted

## 2022-03-26 ENCOUNTER — Encounter: Payer: Self-pay | Admitting: Gastroenterology

## 2022-03-26 NOTE — Telephone Encounter (Signed)
  Follow up Call-     03/25/2022    7:18 AM  Call back number  Post procedure Call Back phone  # [phone]  Permission to leave phone message Yes     Patient questions:  Do you have a fever, pain , or abdominal swelling? No. Pain Score  0 *  Have you tolerated food without any problems? Yes.    Have you been able to return to your normal activities? Yes.    Do you have any questions about your discharge instructions: Diet   No. Medications  No. Follow up visit  No.  Do you have questions or concerns about your Care? No.  Actions: * If pain score is 4 or above: No action needed, pain <4.

## 2022-04-04 ENCOUNTER — Encounter: Payer: Self-pay | Admitting: Gastroenterology

## 2022-04-28 ENCOUNTER — Other Ambulatory Visit: Payer: Self-pay | Admitting: Family Medicine

## 2022-04-29 DIAGNOSIS — C61 Malignant neoplasm of prostate: Secondary | ICD-10-CM | POA: Diagnosis not present

## 2022-05-08 DIAGNOSIS — N401 Enlarged prostate with lower urinary tract symptoms: Secondary | ICD-10-CM | POA: Diagnosis not present

## 2022-05-08 DIAGNOSIS — R350 Frequency of micturition: Secondary | ICD-10-CM | POA: Diagnosis not present

## 2022-05-08 DIAGNOSIS — C61 Malignant neoplasm of prostate: Secondary | ICD-10-CM | POA: Diagnosis not present

## 2022-05-22 ENCOUNTER — Other Ambulatory Visit: Payer: Self-pay | Admitting: Family Medicine

## 2022-05-22 NOTE — Telephone Encounter (Signed)
Requested medication (s) are due for refill today - yes  Requested medication (s) are on the active medication list -yes  Future visit scheduled -no  Last refill: 04/29/22 #30-both  Notes to clinic: both medications have notes on last fill- courtesy #30- need appt with PCP for future refills  Requested Prescriptions  Pending Prescriptions Disp Refills   atorvastatin (LIPITOR) 80 MG tablet [Pharmacy Med Name: ATORVASTATIN 80 MG TABLET] 90 tablet 1    Sig: TAKE 1 TABLET BY MOUTH EVERY DAY     Cardiovascular:  Antilipid - Statins Failed - 05/22/2022  8:32 AM      Failed - Valid encounter within last 12 months    Recent Outpatient Visits           2 years ago Essential hypertension   [place], Warren T, MD   2 years ago 6th nerve palsy, left   [place] Dennard Schaumann, Cammie Mcgee, MD   3 years ago Chronic pain of right knee   [place] [name], MD   3 years ago Acute pain of right knee   [place] Dennard Schaumann, Cammie Mcgee, MD   4 years ago Upper airway cough syndrome   [place] Dennard Schaumann, Cammie Mcgee, MD              Failed - Lipid Panel in normal range within the last 12 months    Cholesterol, Total  Date Value Ref Range Status  07/08/2019 192 100 - 199 mg/dL Final   Cholesterol  Date Value Ref Range Status  10/19/2021 171 <200 mg/dL Final   LDL Cholesterol (Calc)  Date Value Ref Range Status  10/19/2021 79 mg/dL (calc) Final    Comment:    Reference range: <100 . Desirable range <100 mg/dL for primary prevention;   <70 mg/dL for patients with CHD or diabetic patients  with > or = 2 CHD risk factors. Marland Kitchen LDL-C is now calculated using the Martin-Hopkins  calculation, which is a validated novel method providing  better accuracy than the Friedewald equation in the  estimation of LDL-C.  Cresenciano Genre et al. Annamaria Helling. [id number]): 2061-2068   (http://education.[facility].com/faq/FAQ164)    HDL  Date Value Ref Range Status  10/19/2021 62 > OR = 40 mg/dL Final  07/08/2019 65 >39 mg/dL Final   Triglycerides  Date Value Ref Range Status  10/19/2021 210 (H) <150 mg/dL Final    Comment:    . If a non-fasting specimen was collected, consider repeat triglyceride testing on a fasting specimen if clinically indicated.  Yates Decamp et al. J. of Clin. Lipidol. [id number]. Marland Kitchen          Passed - Patient is not pregnant       clopidogrel (PLAVIX) 75 MG tablet [Pharmacy Med Name: CLOPIDOGREL 75 MG TABLET] 90 tablet 1    Sig: TAKE 1 TABLET (75 MG TOTAL) BY MOUTH DAILY. PLEASE REQUEST FUTURE REFILLS FROM PCP.     Hematology: Antiplatelets - clopidogrel Failed - 05/22/2022  8:32 AM      Failed - HCT in normal range and within 180 days    HCT  Date Value Ref Range Status  10/19/2021 44.1 38.5 - 50.0 % Final         Failed - HGB in normal range and within 180 days    Hemoglobin  Date Value Ref Range Status  10/19/2021 14.9 13.2 - 17.1 g/dL Final  Failed - PLT in normal range and within 180 days    Platelets  Date Value Ref Range Status  10/19/2021 245 140 - 400 Thousand/uL Final         Failed - Valid encounter within last 6 months    Recent Outpatient Visits           2 years ago Essential hypertension   [place], Warren T, MD   2 years ago 6th nerve palsy, left   [place] Dennard Schaumann, Cammie Mcgee, MD   3 years ago Chronic pain of right knee   [place] [name], MD   3 years ago Acute pain of right knee   [place] Dennard Schaumann, Cammie Mcgee, MD   4 years ago Upper airway cough syndrome   [place] Pickard, Cammie Mcgee, MD              Passed - Cr in normal range and within 360 days    Creat  Date Value Ref Range Status  10/19/2021 0.73 0.70 - 1.28 mg/dL Final            Requested  Prescriptions  Pending Prescriptions Disp Refills   atorvastatin (LIPITOR) 80 MG tablet [Pharmacy Med Name: ATORVASTATIN 80 MG TABLET] 90 tablet 1    Sig: TAKE 1 TABLET BY MOUTH EVERY DAY     Cardiovascular:  Antilipid - Statins Failed - 05/22/2022  8:32 AM      Failed - Valid encounter within last 12 months    Recent Outpatient Visits           2 years ago Essential hypertension   [place], Warren T, MD   2 years ago 6th nerve palsy, left   [place] Dennard Schaumann, Cammie Mcgee, MD   3 years ago Chronic pain of right knee   [place] [name], MD   3 years ago Acute pain of right knee   [place] Dennard Schaumann, Cammie Mcgee, MD   4 years ago Upper airway cough syndrome   [place] Dennard Schaumann, Cammie Mcgee, MD              Failed - Lipid Panel in normal range within the last 12 months    Cholesterol, Total  Date Value Ref Range Status  07/08/2019 192 100 - 199 mg/dL Final   Cholesterol  Date Value Ref Range Status  10/19/2021 171 <200 mg/dL Final   LDL Cholesterol (Calc)  Date Value Ref Range Status  10/19/2021 79 mg/dL (calc) Final    Comment:    Reference range: <100 . Desirable range <100 mg/dL for primary prevention;   <70 mg/dL for patients with CHD or diabetic patients  with > or = 2 CHD risk factors. Marland Kitchen LDL-C is now calculated using the Martin-Hopkins  calculation, which is a validated novel method providing  better accuracy than the Friedewald equation in the  estimation of LDL-C.  Cresenciano Genre et al. Annamaria Helling. [id number]): 2061-2068  (http://education.[facility].com/faq/FAQ164)    HDL  Date Value Ref Range Status  10/19/2021 62 > OR = 40 mg/dL Final  07/08/2019 65 >39 mg/dL Final   Triglycerides  Date Value Ref Range Status  10/19/2021 210 (H) <150 mg/dL Final    Comment:    . If a non-fasting specimen was collected, consider repeat triglyceride testing on a  fasting specimen if clinically indicated.  Yates Decamp et al. J. of Clin. Lipidol. [id number]. Marland Kitchen          Passed - Patient is not pregnant       clopidogrel (PLAVIX) 75 MG tablet [Pharmacy Med Name: CLOPIDOGREL 75 MG TABLET] 90 tablet 1    Sig: TAKE 1 TABLET (75 MG TOTAL) BY MOUTH DAILY. PLEASE REQUEST FUTURE REFILLS FROM PCP.     Hematology: Antiplatelets - clopidogrel Failed - 05/22/2022  8:32 AM      Failed - HCT in normal range and within 180 days    HCT  Date Value Ref Range Status  10/19/2021 44.1 38.5 - 50.0 % Final         Failed - HGB in normal range and within 180 days    Hemoglobin  Date Value Ref Range Status  10/19/2021 14.9 13.2 - 17.1 g/dL Final         Failed - PLT in normal range and within 180 days    Platelets  Date Value Ref Range Status  10/19/2021 245 140 - 400 Thousand/uL Final         Failed - Valid encounter within last 6 months    Recent Outpatient Visits           2 years ago Essential hypertension   [place], Warren T, MD   2 years ago 6th nerve palsy, left   [place] Dennard Schaumann, Cammie Mcgee, MD   3 years ago Chronic pain of right knee   [place] [name], MD   3 years ago Acute pain of right knee   [place] Dennard Schaumann, Cammie Mcgee, MD   4 years ago Upper airway cough syndrome   [place], Cammie Mcgee, MD              Passed - Cr in normal range and within 360 days    Creat  Date Value Ref Range Status  10/19/2021 0.73 0.70 - 1.28 mg/dL Final

## 2022-06-21 ENCOUNTER — Other Ambulatory Visit: Payer: Self-pay | Admitting: Family Medicine

## 2022-06-21 NOTE — Telephone Encounter (Signed)
Requested medications are due for refill today.  yes  Requested medications are on the active medications list.  yes  Last refill. 12/28/2021 #180 1rf  Future visit scheduled.   no  Notes to clinic.  Pt is more than 3 months overdue for OV.    Requested Prescriptions  Pending Prescriptions Disp Refills   carvedilol (COREG) 6.25 MG tablet [Pharmacy Med Name: CARVEDILOL 6.25 MG TABLET] 180 tablet 1    Sig: TAKE 1 TABLET BY MOUTH 2 TIMES DAILY WITH A MEAL.     Cardiovascular: Beta Blockers 3 Failed - 06/21/2022  2:27 AM      Failed - Valid encounter within last 6 months    Recent Outpatient Visits           2 years ago Essential hypertension   [facility] Donita Brooks, MD   2 years ago 6th nerve palsy, left   [facility] [name], [name], MD   3 years ago Chronic pain of right knee   [facility] Donita Brooks, MD   3 years ago Acute pain of right knee   [facility] Donita Brooks, MD   4 years ago Upper airway cough syndrome   [facility] Pickard, [name], MD              Passed - Cr in normal range and within 360 days    Creat  Date Value Ref Range Status  10/19/2021 0.73 0.70 - 1.28 mg/dL Final         Passed - AST in normal range and within 360 days    AST  Date Value Ref Range Status  10/19/2021 15 10 - 35 U/L Final         Passed - ALT in normal range and within 360 days    ALT  Date Value Ref Range Status  10/19/2021 19 9 - 46 U/L Final         Passed - Last BP in normal range    BP Readings from Last 1 Encounters:  03/25/22 133/76         Passed - Last Heart Rate in normal range    Pulse Readings from Last 1 Encounters:  03/25/22 (!) 55

## 2022-09-06 ENCOUNTER — Other Ambulatory Visit: Payer: Self-pay | Admitting: Family Medicine

## 2022-10-24 ENCOUNTER — Encounter: Payer: Self-pay | Admitting: Family Medicine

## 2022-10-24 ENCOUNTER — Ambulatory Visit (INDEPENDENT_AMBULATORY_CARE_PROVIDER_SITE_OTHER): Payer: Medicare HMO | Admitting: Family Medicine

## 2022-10-24 VITALS — Ht 70.0 in | Wt 219.0 lb

## 2022-10-24 DIAGNOSIS — Z0000 Encounter for general adult medical examination without abnormal findings: Secondary | ICD-10-CM

## 2022-10-24 NOTE — Progress Notes (Signed)
Subjective:   Todd Patterson is a 72 y.o. male who presents for Medicare Annual/Subsequent preventive examination.  Visit Complete: Virtual  I connected with  [name] on 10/24/22 by a audio enabled telemedicine application and verified that I am speaking with the correct person using two identifiers.  Patient Location: Home  Provider Location: Office/Clinic  I discussed the limitations of evaluation and management by telemedicine. The patient expressed understanding and agreed to proceed.  Patient Medicare AWV questionnaire was completed by the patient on 10/24/2022; I have confirmed that all information answered by patient is correct and no changes since this date.  Review of Systems    No concerns Cardiac Risk Factors include: advanced age (>16men, >69 women);male gender;obesity (BMI >30kg/m2);hypertension     Objective:    Today's Vitals   10/24/22 1002  Weight: 219 lb (99.3 kg)  Height: 5\' 10"  (1.778 m)   Body mass index is 31.42 kg/m.     10/24/2022   10:08 AM 02/07/2021    8:39 AM 01/15/2021   10:58 AM 12/14/2020   10:04 AM 10/12/2020   12:30 PM 08/17/2020   11:21 AM 07/04/2016    6:00 PM  Advanced Directives  Does Patient Have a Medical Advance Directive? Yes No No No No No No  Type of Media planner of Healthcare Power of Attorney in Chart? No - copy requested        Would patient like information on creating a medical advance directive?  Yes (ED - Information included in AVS) No - Patient declined Yes (ED - Information included in AVS) No - Patient declined No - Patient declined No - Patient declined    Current Medications (verified) Outpatient Encounter Medications as of 10/24/2022  Medication Sig   atorvastatin (LIPITOR) 80 MG tablet TAKE 1 TABLET BY MOUTH EVERY DAY   carvedilol (COREG) 6.25 MG tablet TAKE 1 TABLET BY MOUTH 2 TIMES DAILY WITH A MEAL.   clopidogrel (PLAVIX) 75 MG tablet TAKE 1 TABLET (75 MG TOTAL)  BY MOUTH DAILY. PLEASE REQUEST FUTURE REFILLS FROM PCP.   docusate sodium (COLACE) 100 MG capsule Take 1 capsule (100 mg total) by mouth daily as needed for up to 30 doses.   dorzolamide-timolol (COSOPT) 22.3-6.8 MG/ML ophthalmic solution SMARTSIG:In Eye(s)   ibuprofen (ADVIL) 200 MG tablet Take 200 mg by mouth every 6 (six) hours as needed.   latanoprost (XALATAN) 0.005 % ophthalmic solution Place 1 drop into both eyes at bedtime.   losartan (COZAAR) 25 MG tablet TAKE 2 TABLETS BY MOUTH EVERY DAY   tamsulosin (FLOMAX) 0.4 MG CAPS capsule TAKE 1 CAPSULE BY MOUTH EVERY DAY AFTER SUPPER   terbinafine (LAMISIL) 250 MG tablet Take 1 tablet (250 mg total) by mouth daily.   No facility-administered encounter medications on file as of 10/24/2022.    Allergies (verified) Patient has no known allergies.   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   neurologist-- dr Terrace Arabia;  09-05-2017 acute onset right 6th nerve palsy with ischemic event,  started on plavix;  06/ 2021 left 6th nerve palsy probable same etiology as the right   Anticoagulant long-term use    plavix---  managed by pcp   Arthritis    Colon polyps    Coronary artery disease    cardiologist--- dr Allyson Sabal; 2010  hx MI s/p cath with DES x1  to LAD per cardiology  note done in [place]   Dental bridge present    x2 upper  both permanent   Family history of breast cancer    Glaucoma, both eyes    History of COVID-19 03/21/2020   result in epic   History of myocardial infarction 2010   PCI w/ DES to LAD   Hx of colonic polyps    colonoscopy 2018, recommended repeat in 1 year due to numbe rof polyps.    Hyperlipidemia    Hypertension    Malignant neoplasm prostate [facility])    urologist-- dr gay;  dx 06/ 2022, gleason 3+3, PSA 8.82   Polyposis coli    Primary hyperparathyroidism (HCC)    S/P drug eluting coronary stent placement 2010   to LAD   Past Surgical History:  Procedure Laterality Date    APPENDECTOMY  1974   age 73   CORONARY ANGIOPLASTY WITH STENT PLACEMENT  2010   DES x1 LAD  (in [place])   CYSTOSCOPY N/A 01/15/2021   Procedure: CYSTOSCOPY FLEXIBLE;  Surgeon: Jannifer Hick, MD;  Location: [facility];  Service: Urology;  Laterality: N/A;  No seeds detected in bladder per Dr. Cardell Peach   PARATHYROIDECTOMY N/A 07/04/2016   Procedure: PARATHYROIDECTOMY;  Surgeon: Avel Peace, MD;  Location: [facility];  Service: General;  Laterality: N/A;   RADIOACTIVE SEED IMPLANT N/A 01/15/2021   Procedure: RADIOACTIVE SEED IMPLANT/BRACHYTHERAPY IMPLANT;  Surgeon: Jannifer Hick, MD;  Location: [facility];  Service: Urology;  Laterality: N/A;   SPACE OAR INSTILLATION N/A 01/15/2021   Procedure: SPACE OAR INSTILLATION;  Surgeon: Jannifer Hick, MD;  Location: [facility];  Service: Urology;  Laterality: N/A;   TONSILLECTOMY AND ADENOIDECTOMY  1970   age 28   Family History  Problem Relation Age of Onset   Dementia Mother    Heart disease Father    Cancer Sister        d. 25   Breast cancer Sister 8       d. 35   Cancer Niece        NOS, d. 34   Throat cancer Nephew        d. 59   Social History   Socioeconomic History   Marital status: Married    Spouse name: Not on file   Number of children: 2   Years of education: Not on file   Highest education level: Not on file  Occupational History   Occupation: retired  Tobacco Use   Smoking status: Former    Current packs/day: 0.00    Average packs/day: 2.0 packs/day for 42.0 years (84.0 ttl pk-yrs)    Types: Cigarettes    Start date: 74    Quit date: 2010    Years since quitting: 14.6   Smokeless tobacco: Never  Vaping Use   Vaping status: Never Used  Substance and Sexual Activity   Alcohol use: Yes    Alcohol/week: 14.0 - 21.0 standard drinks of alcohol    Types: 14 - 21 Cans of beer per week    Comment: 01-09-2021  pt stated 2-3 (12oz) beers daily   Drug use: No   Sexual  activity: Yes  Other Topics Concern   Not on file  Social History Narrative   Not on file   Social Determinants of Health   Financial Resource Strain: Low Risk  (10/24/2022)   Overall Financial Resource Strain (CARDIA)    Difficulty of Paying Living Expenses: Not hard at all  Food Insecurity: No Food Insecurity (10/24/2022)   Hunger Vital Sign    Worried About Running Out of Food in the Last Year: Never true    Ran Out of Food in the Last Year: Never true  Transportation Needs: No Transportation Needs (10/24/2022)   PRAPARE - Administrator, Civil Service (Medical): No    Lack of Transportation (Non-Medical): No  Physical Activity: Sufficiently Active (10/24/2022)   Exercise Vital Sign    Days of Exercise per Week: 4 days    Minutes of Exercise per Session: 120 min  Stress: No Stress Concern Present (10/24/2022)   [employer] of Occupational Health - Occupational Stress Questionnaire    Feeling of Stress : Not at all  Social Connections: Moderately Isolated (10/24/2022)   Social Connection and Isolation Panel [NHANES]    Frequency of Communication with Friends and Family: More than three times a week    Frequency of Social Gatherings with Friends and Family: More than three times a week    Attends Religious Services: Never    Database administrator or Organizations: No    Attends Engineer, structural: Never    Marital Status: Married    Tobacco Counseling Counseling given: Not Answered   Clinical Intake:  Pre-visit preparation completed: No  Pain : No/denies pain     Nutritional Status: BMI > 30  Obese Nutritional Risks: None Diabetes: No  How often do you need to have someone help you when you read instructions, pamphlets, or other written materials from your doctor or pharmacy?: 1 - Never  Interpreter Needed?: No      Activities of Daily Living    10/24/2022   10:03 AM  In your present state of health, do you have any difficulty performing  the following activities:  Hearing? 0  Vision? 0  Difficulty concentrating or making decisions? 0  Walking or climbing stairs? 0  Dressing or bathing? 0  Doing errands, shopping? 0  Preparing Food and eating ? N  Using the Toilet? N  In the past six months, have you accidently leaked urine? N  Do you have problems with loss of bowel control? N  Managing your Medications? N  Managing your Finances? N  Housekeeping or managing your Housekeeping? N    Patient Care Team: Donita Brooks, MD as PCP - General (Family Medicine) [name], MD as PCP - Cardiology (Cardiology) Elwin Mocha, MD as Consulting Physician (Ophthalmology) Erroll Luna, [facility] (Inactive) as Pharmacist (Pharmacist) Jannifer Hick, MD as Consulting Physician (Urology) Margaretmary Dys, MD as Consulting Physician (Radiation Oncology) Axel Filler Larna Daughters, NP as Nurse Practitioner (Hematology and Oncology) Maryclare Labrador, RN as Registered Nurse  Indicate any recent Medical Services you may have received from other than [facility] providers in the past year (date may be approximate).     Assessment:   This is a routine wellness examination for Jaziyah.  Hearing/Vision screen No results found.  Dietary issues and exercise activities discussed:     Goals Addressed             This Visit's Progress    Track and Manage My Blood Pressure-Hypertension   On track    Timeframe:  Long-Range Goal Priority:  High Start Date:   07/10/20                          Expected End Date:  01/09/21  Follow Up Date 10/09/20   - check blood pressure 3 times per week - choose a place to take my blood pressure (home, clinic or office, retail store) - write blood pressure results in a log or diary    Why is this important?   You won't feel high blood pressure, but it can still hurt your blood vessels.  High blood pressure can cause heart or kidney problems. It can also cause a  stroke.  Making lifestyle changes like losing a little weight or eating less salt will help.  Checking your blood pressure at home and at different times of the day can help to control blood pressure.  If the doctor prescribes medicine remember to take it the way the doctor ordered.  Call the office if you cannot afford the medicine or if there are questions about it.     Notes:      Weight (lb) < 175 lb (79.4 kg)         Depression Screen    10/24/2022   10:07 AM 02/11/2022    3:20 PM 10/19/2021   12:06 PM 08/17/2020   11:24 AM 12/01/2017    2:17 PM 12/31/2016   10:28 AM  PHQ 2/9 Scores  PHQ - 2 Score 0 0 0 0 0 0    Fall Risk    10/24/2022   10:09 AM 02/11/2022    3:20 PM 08/17/2020   11:23 AM 12/01/2017    2:17 PM 12/31/2016   10:28 AM  Fall Risk   Falls in the past year? 0 0 0 No No  Number falls in past yr: 0 0 0    Injury with Fall? 0 0 0    Risk for fall due to :  No Fall Risks No Fall Risks    Follow up  Falls prevention discussed Falls evaluation completed;Falls prevention discussed      MEDICARE RISK AT HOME:  Medicare Risk at Home - 10/24/22 1009     Any stairs in or around the home? No    If so, are there any without handrails? No    Home free of loose throw rugs in walkways, pet beds, electrical cords, etc? No    Adequate lighting in your home to reduce risk of falls? No    Life alert? No    Use of a cane, walker or w/c? No    Grab bars in the bathroom? No    Shower chair or bench in shower? No    Elevated toilet seat or a handicapped toilet? No             TIMED UP AND GO:  Was the test performed?  No    Cognitive Function:        10/24/2022   10:10 AM  6CIT Screen  What Year? 0 points  What month? 0 points  What time? 0 points  Count back from 20 0 points  Months in reverse 4 points  Repeat phrase 0 points  Total Score 4 points    Immunizations Immunization History  Administered Date(s) Administered   Fluad Quad(high Dose 65+)  11/18/2018, 12/16/2019, 01/24/2021, 01/15/2022   Influenza,inj,Quad PF,6+ Mos 02/06/2016, 12/31/2016, 12/01/2017   PFIZER(Purple Top)SARS-COV-2 Vaccination 04/23/2019, 05/14/2019   Pneumococcal Conjugate-13 01/16/2017   Pneumococcal Polysaccharide-23 02/06/2016    TDAP status: Due, Education has been provided regarding the importance of this vaccine. Advised may receive this vaccine at local pharmacy or Health Dept. Aware to provide a copy of the  vaccination record if obtained from local pharmacy or Health Dept. Verbalized acceptance and understanding.  Flu Vaccine status: Declined, Education has been provided regarding the importance of this vaccine but patient still declined. Advised may receive this vaccine at local pharmacy or Health Dept. Aware to provide a copy of the vaccination record if obtained from local pharmacy or Health Dept. Verbalized acceptance and understanding.  Pneumococcal vaccine status: Up to date  Covid-19 vaccine status: Information provided on how to obtain vaccines.   Qualifies for Shingles Vaccine? No   Zostavax completed No   Shingrix Completed?: No.    Education has been provided regarding the importance of this vaccine. Patient has been advised to call insurance company to determine out of pocket expense if they have not yet received this vaccine. Advised may also receive vaccine at local pharmacy or Health Dept. Verbalized acceptance and understanding.  Screening Tests Health Maintenance  Topic Date Due   DTaP/Tdap/Td (1 - Tdap) Never done   Zoster Vaccines- Shingrix (1 of 2) Never done   Lung Cancer Screening  Never done   COVID-19 Vaccine (3 - Pfizer risk series) 06/11/2019   INFLUENZA VACCINE  10/17/2022   Colonoscopy  03/26/2023   Medicare Annual Wellness (AWV)  10/24/2023   Pneumonia Vaccine 42+ Years old  Completed   Hepatitis C Screening  Completed   HPV VACCINES  Aged Out    Health Maintenance  Health Maintenance Due  Topic Date Due    DTaP/Tdap/Td (1 - Tdap) Never done   Zoster Vaccines- Shingrix (1 of 2) Never done   Lung Cancer Screening  Never done   COVID-19 Vaccine (3 - Pfizer risk series) 06/11/2019   INFLUENZA VACCINE  10/17/2022    Colorectal cancer screening: Type of screening: Colonoscopy. Completed 2024. Repeat every 1 years  Lung Cancer Screening: (Low Dose CT Chest recommended if Age 67-80 years, 20 pack-year currently smoking OR have quit w/in 15years.) does qualify.   Lung Cancer Screening Referral: declined  Additional Screening:  Hepatitis C Screening: does not qualify; Completed 2021  Vision Screening: Recommended annual ophthalmology exams for early detection of glaucoma and other disorders of the eye. Is the patient up to date with their annual eye exam?  Yes  Who is the provider or what is the name of the office in which the patient attends annual eye exams? [facility] If pt is not established with a provider, would they like to be referred to a provider to establish care? No .   Dental Screening: Recommended annual dental exams for proper oral hygiene  Diabetic Foot Exam: n/a  Community Resource Referral / Chronic Care Management: CRR required this visit?  No   CCM required this visit?  No     Plan:     I have personally reviewed and noted the following in the patient's chart:   Medical and social history Use of alcohol, tobacco or illicit drugs  Current medications and supplements including opioid prescriptions. Patient is not currently taking opioid prescriptions. Functional ability and status Nutritional status Physical activity Advanced directives List of other physicians Hospitalizations, surgeries, and ER visits in previous 12 months Vitals Screenings to include cognitive, depression, and falls Referrals and appointments  In addition, I have reviewed and discussed with patient certain preventive protocols, quality metrics, and best practice recommendations. A  written personalized care plan for preventive services as well as general preventive health recommendations were provided to patient.     Park Meo, FNP  10/24/2022   After Visit Summary: (Declined) Due to this being a telephonic visit, with patients personalized plan was offered to patient but patient Declined AVS at this time   Nurse Notes: no concerns

## 2022-11-06 DIAGNOSIS — C61 Malignant neoplasm of prostate: Secondary | ICD-10-CM | POA: Diagnosis not present

## 2022-11-06 DIAGNOSIS — R350 Frequency of micturition: Secondary | ICD-10-CM | POA: Diagnosis not present

## 2022-11-06 DIAGNOSIS — N401 Enlarged prostate with lower urinary tract symptoms: Secondary | ICD-10-CM | POA: Diagnosis not present

## 2022-11-15 ENCOUNTER — Other Ambulatory Visit: Payer: Self-pay | Admitting: Family Medicine

## 2022-11-15 NOTE — Telephone Encounter (Signed)
Requested medication (s) are due for refill today: yes  Requested medication (s) are on the active medication list: yes  Last refill:  05/27/22  Future visit scheduled: no  Notes to clinic:  Unable to refill per protocol, courtesy refill already given, routing for provider approval.      Requested Prescriptions  Pending Prescriptions Disp Refills   atorvastatin (LIPITOR) 80 MG tablet [Pharmacy Med Name: ATORVASTATIN 80 MG TABLET] 90 tablet 1    Sig: TAKE 1 TABLET BY MOUTH EVERY DAY     Cardiovascular:  Antilipid - Statins Failed - 11/15/2022  1:36 AM      Failed - Valid encounter within last 12 months    Recent Outpatient Visits           2 years ago Essential hypertension   [facility] Pickard, [name], MD   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, left   [facility] Pickard, [name], MD   4 years ago Chronic pain of right knee   [facility] [name], [name], MD   4 years ago Acute pain of right knee   [facility] [name], [name], MD   4 years ago Upper airway cough syndrome   [facility] Pickard, [name], MD       Future Appointments             In 3 months Allyson Sabal Delton See, MD [facility]            Failed - Lipid Panel in normal range within the last 12 months    Cholesterol, Total  Date Value Ref Range Status  07/08/2019 192 100 - 199 mg/dL Final   Cholesterol  Date Value Ref Range Status  10/19/2021 171 <200 mg/dL Final   LDL Cholesterol (Calc)  Date Value Ref Range Status  10/19/2021 79 mg/dL (calc) Final    Comment:    Reference range: <100 . Desirable range <100 mg/dL for primary prevention;   <70 mg/dL for patients with CHD or diabetic patients  with > or = 2 CHD risk factors. Marland Kitchen LDL-C is now calculated using the Martin-Hopkins  calculation, which is a validated novel method providing  better accuracy than the Friedewald equation in the   estimation of LDL-C.  Horald Pollen et al. Lenox Ahr. 6578;469(62): 2061-2068  (http://education.[employer].com/faq/FAQ164)    HDL  Date Value Ref Range Status  10/19/2021 62 > OR = 40 mg/dL Final  95/28/4132 65 >44 mg/dL Final   Triglycerides  Date Value Ref Range Status  10/19/2021 210 (H) <150 mg/dL Final    Comment:    . If a non-fasting specimen was collected, consider repeat triglyceride testing on a fasting specimen if clinically indicated.  Perry Mount et al. J. of Clin. Lipidol. 2015;9:129-169. Marland Kitchen          Passed - Patient is not pregnant

## 2022-11-17 ENCOUNTER — Other Ambulatory Visit: Payer: Self-pay | Admitting: Family Medicine

## 2022-11-20 NOTE — Telephone Encounter (Signed)
Requested medication (s) are due for refill today: Due 11/27/22  Requested medication (s) are on the active medication list: yes    Last refill: 05/27/22  #90  1 refill  Future visit scheduled no  Notes to clinic:Failed due to labs, please review. Thank you.  Requested Prescriptions  Pending Prescriptions Disp Refills   clopidogrel (PLAVIX) 75 MG tablet [Pharmacy Med Name: CLOPIDOGREL 75 MG TABLET] 90 tablet 1    Sig: TAKE 1 TABLET (75 MG TOTAL) BY MOUTH DAILY. PLEASE REQUEST FUTURE REFILLS FROM PCP.     Hematology: Antiplatelets - clopidogrel Failed - 11/17/2022  8:50 AM      Failed - HCT in normal range and within 180 days    HCT  Date Value Ref Range Status  10/19/2021 44.1 38.5 - 50.0 % Final         Failed - HGB in normal range and within 180 days    Hemoglobin  Date Value Ref Range Status  10/19/2021 14.9 13.2 - 17.1 g/dL Final         Failed - PLT in normal range and within 180 days    Platelets  Date Value Ref Range Status  10/19/2021 245 140 - 400 Thousand/uL Final         Failed - Cr in normal range and within 360 days    Creat  Date Value Ref Range Status  10/19/2021 0.73 0.70 - 1.28 mg/dL Final         Failed - Valid encounter within last 6 months    Recent Outpatient Visits           2 years ago Essential hypertension   [facility] Donita Brooks, MD   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, left   [facility] [name], [name], MD   4 years ago Chronic pain of right knee   [facility] Donita Brooks, MD   4 years ago Acute pain of right knee   [facility] [name], [name], MD   4 years ago Upper airway cough syndrome   [facility] Pickard, [name], MD       Future Appointments             In 3 months Allyson Sabal, Delton See, MD [facility]

## 2022-12-13 ENCOUNTER — Other Ambulatory Visit: Payer: Self-pay | Admitting: Family Medicine

## 2022-12-13 NOTE — Telephone Encounter (Signed)
Requested medications are due for refill today.  Yes  Requested medications are on the active medications list.  yes  Last refill. 06/21/2022 #180 1 rf  Future visit scheduled.   no  Notes to clinic.  Labs are expired.    Requested Prescriptions  Pending Prescriptions Disp Refills   carvedilol (COREG) 6.25 MG tablet [Pharmacy Med Name: CARVEDILOL 6.25 MG TABLET] 180 tablet 1    Sig: TAKE 1 TABLET BY MOUTH TWICE A DAY WITH FOOD     Cardiovascular: Beta Blockers 3 Failed - 12/13/2022  1:34 AM      Failed - Cr in normal range and within 360 days    Creat  Date Value Ref Range Status  10/19/2021 0.73 0.70 - 1.28 mg/dL Final         Failed - AST in normal range and within 360 days    AST  Date Value Ref Range Status  10/19/2021 15 10 - 35 U/L Final         Failed - ALT in normal range and within 360 days    ALT  Date Value Ref Range Status  10/19/2021 19 9 - 46 U/L Final         Failed - Valid encounter within last 6 months    Recent Outpatient Visits           2 years ago Essential hypertension   [facility] Donita Brooks, MD   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, left   [facility] [name], [name], MD   4 years ago Chronic pain of right knee   [facility] Donita Brooks, MD   4 years ago Acute pain of right knee   [facility] [name], [name], MD   4 years ago Upper airway cough syndrome   [facility] Pickard, [name], MD       Future Appointments             In 2 months Allyson Sabal Delton See, MD [facility] - Last BP in normal range    BP Readings from Last 1 Encounters:  03/25/22 133/76         Passed - Last Heart Rate in normal range    Pulse Readings from Last 1 Encounters:  03/25/22 (!) 55

## 2022-12-20 ENCOUNTER — Ambulatory Visit: Payer: Medicare HMO | Admitting: Family Medicine

## 2022-12-26 ENCOUNTER — Encounter: Payer: Self-pay | Admitting: Family Medicine

## 2022-12-26 ENCOUNTER — Ambulatory Visit (INDEPENDENT_AMBULATORY_CARE_PROVIDER_SITE_OTHER): Payer: Medicare HMO | Admitting: Family Medicine

## 2022-12-26 VITALS — BP 140/82 | HR 68 | Temp 97.6°F | Ht 70.0 in | Wt 226.0 lb

## 2022-12-26 DIAGNOSIS — I6930 Unspecified sequelae of cerebral infarction: Secondary | ICD-10-CM | POA: Diagnosis not present

## 2022-12-26 DIAGNOSIS — Z23 Encounter for immunization: Secondary | ICD-10-CM | POA: Diagnosis not present

## 2022-12-26 DIAGNOSIS — I639 Cerebral infarction, unspecified: Secondary | ICD-10-CM

## 2022-12-26 DIAGNOSIS — I2510 Atherosclerotic heart disease of native coronary artery without angina pectoris: Secondary | ICD-10-CM | POA: Diagnosis not present

## 2022-12-26 MED ORDER — LOSARTAN POTASSIUM 100 MG PO TABS: 100.0000 mg | ORAL_TABLET | Freq: Every day | ORAL | 3 refills | Status: DC

## 2022-12-26 NOTE — Addendum Note (Signed)
Addended by: Venia Carbon K on: 12/26/2022 08:25 AM   Modules accepted: Orders

## 2022-12-26 NOTE — Progress Notes (Signed)
Subjective:    Patient ID: Todd Patterson, male    DOB: 1950-12-03, 72 y.o.   MRN: [id number]   Patient is a very pleasant 72 year old white male whose past medical history is significant for myocardial infarction in 2008. Patient underwent PTCA with stenting.  Patient states that he had a bare-metal stent. He was continued on aspirin and Plavix for 2 years and then was instructed to continue aspirin thereafter. He also has a history of hyperlipidemia and hypertension.  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  After these incidents, he was switched to plavix instead of aspirin.  Patient is due for a flu shot today.  He would like to get bills.  He defers the shingles shot to a later date.  He had his colonoscopy earlier this year and identified more than 10 polyps.  They recommended repeating a colonoscopy in 1 year.  His PSA is monitored by his urologist.  He denies any chest pain.  He denies any shortness of breath.  He denies any dyspnea on exertion.  His blood pressure today is borderline elevated.  He gets similar readings at home when he checks his blood pressure.  He denies any hypotension. 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   neurologist-- dr Terrace Arabia;  09-05-2017 acute onset right 6th nerve palsy with ischemic event,  started on plavix;  06/ 2021 left 6th nerve palsy probable same etiology as the right   Anticoagulant long-term use    plavix---  managed by pcp   Arthritis    Colon polyps    Coronary artery disease    cardiologist--- dr Allyson Sabal; 2010  hx MI s/p cath with DES x1  to LAD per cardiology note done in [place]   Dental bridge present    x2 upper  both permanent   Family history of breast cancer    Glaucoma, both eyes    History of COVID-19 03/21/2020   result in epic   History of myocardial infarction 2010   PCI w/ DES to LAD   Hx of colonic polyps    colonoscopy 2018, recommended repeat in 1 year due to  numbe rof polyps.    Hyperlipidemia    Hypertension    Malignant neoplasm prostate [facility])    urologist-- dr gay;  dx 06/ 2022, gleason 3+3, PSA 8.82   Polyposis coli    Primary hyperparathyroidism (HCC)    S/P drug eluting coronary stent placement 2010   to LAD   Past Surgical History:  Procedure Laterality Date   APPENDECTOMY  1974   age 16   CORONARY ANGIOPLASTY WITH STENT PLACEMENT  2010   DES x1 LAD  (in [place])   CYSTOSCOPY N/A 01/15/2021   Procedure: CYSTOSCOPY FLEXIBLE;  Surgeon: Jannifer Hick, MD;  Location: [facility];  Service: Urology;  Laterality: N/A;  No seeds detected in bladder per Dr. Cardell Peach   PARATHYROIDECTOMY N/A 07/04/2016   Procedure: PARATHYROIDECTOMY;  Surgeon: Avel Peace, MD;  Location: [facility];  Service: General;  Laterality: N/A;   RADIOACTIVE SEED IMPLANT N/A 01/15/2021   Procedure: RADIOACTIVE SEED IMPLANT/BRACHYTHERAPY IMPLANT;  Surgeon: Jannifer Hick, MD;  Location: [facility];  Service: Urology;  Laterality: N/A;   SPACE OAR INSTILLATION N/A 01/15/2021   Procedure: SPACE OAR INSTILLATION;  Surgeon: Jannifer Hick, MD;  Location: [facility];  Service: Urology;  Laterality: N/A;   TONSILLECTOMY  AND ADENOIDECTOMY  1970   age 15   Current Outpatient Medications on File Prior to Visit  Medication Sig Dispense Refill   atorvastatin (LIPITOR) 80 MG tablet TAKE 1 TABLET BY MOUTH EVERY DAY 90 tablet 1   carvedilol (COREG) 6.25 MG tablet TAKE 1 TABLET BY MOUTH 2 TIMES DAILY WITH A MEAL. 180 tablet 1   clopidogrel (PLAVIX) 75 MG tablet TAKE 1 TABLET (75 MG TOTAL) BY MOUTH DAILY. PLEASE REQUEST FUTURE REFILLS FROM PCP. 90 tablet 1   latanoprost (XALATAN) 0.005 % ophthalmic solution Place 1 drop into both eyes at bedtime.     losartan (COZAAR) 25 MG tablet TAKE 2 TABLETS BY MOUTH EVERY DAY 180 tablet 1   No current facility-administered medications on file prior to visit.     No Known Allergies  Social  History   Socioeconomic History   Marital status: Married    Spouse name: Not on file   Number of children: 2   Years of education: Not on file   Highest education level: Not on file  Occupational History   Occupation: retired  Tobacco Use   Smoking status: Former    Current packs/day: 0.00    Average packs/day: 2.0 packs/day for 42.0 years (84.0 ttl pk-yrs)    Types: Cigarettes    Start date: 58    Quit date: 2010    Years since quitting: 14.7   Smokeless tobacco: Never  Vaping Use   Vaping status: Never Used  Substance and Sexual Activity   Alcohol use: Yes    Alcohol/week: 14.0 - 21.0 standard drinks of alcohol    Types: 14 - 21 Cans of beer per week    Comment: 01-09-2021  pt stated 2-3 (12oz) beers daily   Drug use: No   Sexual activity: Yes  Other Topics Concern   Not on file  Social History Narrative   Not on file   Social Determinants of Health   Financial Resource Strain: Low Risk  (10/24/2022)   Overall Financial Resource Strain (CARDIA)    Difficulty of Paying Living Expenses: Not hard at all  Food Insecurity: No Food Insecurity (10/24/2022)   Hunger Vital Sign    Worried About Running Out of Food in the Last Year: Never true    Ran Out of Food in the Last Year: Never true  Transportation Needs: No Transportation Needs (10/24/2022)   PRAPARE - Administrator, Civil Service (Medical): No    Lack of Transportation (Non-Medical): No  Physical Activity: Sufficiently Active (10/24/2022)   Exercise Vital Sign    Days of Exercise per Week: 4 days    Minutes of Exercise per Session: 120 min  Stress: No Stress Concern Present (10/24/2022)   [employer] of Occupational Health - Occupational Stress Questionnaire    Feeling of Stress : Not at all  Social Connections: Moderately Isolated (10/24/2022)   Social Connection and Isolation Panel [NHANES]    Frequency of Communication with Friends and Family: More than three times a week    Frequency of Social  Gatherings with Friends and Family: More than three times a week    Attends Religious Services: Never    Database administrator or Organizations: No    Attends Banker Meetings: Never    Marital Status: Married  Catering manager Violence: Not At Risk (10/24/2022)   Humiliation, Afraid, Rape, and Kick questionnaire    Fear of Current or Ex-Partner: No    Emotionally Abused: No  Physically Abused: No    Sexually Abused: No   Family History  Problem Relation Age of Onset   Dementia Mother    Heart disease Father    Cancer Sister        d. 104   Breast cancer Sister 27       d. 5   Cancer Niece        NOS, d. 52   Throat cancer Nephew        d. 69      Review of Systems  Neurological:  Positive for headaches.  All other systems reviewed and are negative.      Objective:   Physical Exam Vitals reviewed.  Constitutional:      General: He is not in acute distress.    Appearance: He is well-developed. He is obese. He is not ill-appearing or diaphoretic.  HENT:     Head: Normocephalic and atraumatic.     Right Ear: External ear normal.     Left Ear: External ear normal.     Nose: Nose normal.     Mouth/Throat:     Pharynx: No oropharyngeal exudate.  Eyes:     General: No scleral icterus.       Right eye: No discharge.        Left eye: No discharge.     Extraocular Movements:     Right eye: Normal extraocular motion and no nystagmus.     Left eye: Normal extraocular motion and no nystagmus.     Conjunctiva/sclera: Conjunctivae normal.     Pupils: Pupils are equal, round, and reactive to light.  Neck:     Thyroid: No thyromegaly.     Vascular: No carotid bruit or JVD.     Trachea: No tracheal deviation.  Cardiovascular:     Rate and Rhythm: Normal rate and regular rhythm.     Heart sounds: Normal heart sounds. No murmur heard.    No friction rub. No gallop.  Pulmonary:     Effort: Pulmonary effort is normal. No respiratory distress.     Breath  sounds: Normal breath sounds. No stridor. No wheezing or rales.  Chest:     Chest wall: No tenderness.  Abdominal:     General: Bowel sounds are normal. There is no distension.     Palpations: Abdomen is soft. There is no mass.     Tenderness: There is no abdominal tenderness. There is no guarding or rebound.  Musculoskeletal:        General: No tenderness or deformity. Normal range of motion.     Cervical back: Normal range of motion and neck supple. No rigidity.     Right lower leg: Edema present.     Left lower leg: Edema present.  Lymphadenopathy:     Cervical: No cervical adenopathy.  Skin:    General: Skin is warm.     Coloration: Skin is not jaundiced or pale.     Findings: No bruising, erythema, lesion or rash.  Neurological:     Mental Status: He is alert and oriented to person, place, and time.     Cranial Nerves: No cranial nerve deficit.     Sensory: No sensory deficit.     Motor: No tremor, atrophy or abnormal muscle tone.     Coordination: Coordination normal.     Gait: Gait normal.     Deep Tendon Reflexes: Reflexes are normal and symmetric.  Psychiatric:        Behavior: Behavior normal.  Thought Content: Thought content normal.        Judgment: Judgment normal.   Patient has a small umbilical hernia        Assessment & Plan:  Coronary artery disease involving native coronary artery of native heart without angina pectoris - Plan: CBC with Differential/Platelet, COMPLETE METABOLIC PANEL WITH GFR, Lipid panel  Cerebrovascular accident (CVA), unspecified mechanism (HCC) - Plan: CBC with Differential/Platelet, COMPLETE METABOLIC PANEL WITH GFR, Lipid panel PSA is monitored by urology.  Colonoscopy is up-to-date.  Patient received his flu shot today.  Check CBC CMP and lipid panel.  I like to see his LDL cholesterol now lower than 55.  I increased his losartan to 100 mg a day to try to maintain a systolic blood pressure well below 140.  Patient defers the  shingles vaccine at the present time.

## 2022-12-27 LAB — COMPLETE METABOLIC PANEL WITH GFR
AG Ratio: 1.5 (calc) (ref 1.0–2.5)
ALT: 17 U/L (ref 9–46)
AST: 14 U/L (ref 10–35)
Albumin: 4.1 g/dL (ref 3.6–5.1)
Alkaline phosphatase (APISO): 60 U/L (ref 35–144)
BUN: 15 mg/dL (ref 7–25)
CO2: 29 mmol/L (ref 20–32)
Calcium: 9.4 mg/dL (ref 8.6–10.3)
Chloride: 104 mmol/L (ref 98–110)
Creat: 0.74 mg/dL (ref 0.70–1.28)
Globulin: 2.7 g/dL (ref 1.9–3.7)
Glucose, Bld: 94 mg/dL (ref 65–99)
Potassium: 5 mmol/L (ref 3.5–5.3)
Sodium: 142 mmol/L (ref 135–146)
Total Bilirubin: 0.5 mg/dL (ref 0.2–1.2)
Total Protein: 6.8 g/dL (ref 6.1–8.1)
eGFR: 96 mL/min/{1.73_m2} (ref >=60)

## 2022-12-27 LAB — LIPID PANEL
Cholesterol: 169 mg/dL (ref <200)
HDL: 64 mg/dL (ref >=40)
LDL Cholesterol (Calc): 84 mg/dL
Non-HDL Cholesterol (Calc): 105 mg/dL (ref <130)
Total CHOL/HDL Ratio: 2.6 (calc) (ref <5.0)
Triglycerides: 117 mg/dL (ref <150)

## 2022-12-27 LAB — CBC WITH DIFFERENTIAL/PLATELET
Absolute Monocytes: 1121 {cells}/uL — ABNORMAL HIGH (ref 200–950)
Basophils Absolute: 78 {cells}/uL (ref 0–200)
Basophils Relative: 0.7 %
Eosinophils Absolute: 466 {cells}/uL (ref 15–500)
Eosinophils Relative: 4.2 %
HCT: 47.9 % (ref 38.5–50.0)
Hemoglobin: 15.6 g/dL (ref 13.2–17.1)
Lymphs Abs: 2531 {cells}/uL (ref 850–3900)
MCH: 29.7 pg (ref 27.0–33.0)
MCHC: 32.6 g/dL (ref 32.0–36.0)
MCV: 91.2 fL (ref 80.0–100.0)
MPV: 10.7 fL (ref 7.5–12.5)
Monocytes Relative: 10.1 %
Neutro Abs: 6904 {cells}/uL (ref 1500–7800)
Neutrophils Relative %: 62.2 %
Platelets: 266 10*3/uL (ref 140–400)
RBC: 5.25 10*6/uL (ref 4.20–5.80)
RDW: 12.7 % (ref 11.0–15.0)
Total Lymphocyte: 22.8 %
WBC: 11.1 10*3/uL — ABNORMAL HIGH (ref 3.8–10.8)

## 2023-01-13 ENCOUNTER — Encounter (HOSPITAL_COMMUNITY): Payer: Self-pay

## 2023-01-13 ENCOUNTER — Other Ambulatory Visit: Payer: Self-pay

## 2023-01-13 ENCOUNTER — Observation Stay (HOSPITAL_COMMUNITY)
Admission: EM | Admit: 2023-01-13 | Discharge: 2023-01-14 | Disposition: A | Discharge status: Home / Self Care | Payer: Medicare HMO | Attending: Internal Medicine | Admitting: Internal Medicine

## 2023-01-13 ENCOUNTER — Emergency Department (HOSPITAL_COMMUNITY): Payer: Medicare HMO

## 2023-01-13 ENCOUNTER — Observation Stay (HOSPITAL_COMMUNITY): Payer: Medicare HMO

## 2023-01-13 DIAGNOSIS — Z1152 Encounter for screening for COVID-19: Secondary | ICD-10-CM | POA: Insufficient documentation

## 2023-01-13 DIAGNOSIS — E785 Hyperlipidemia, unspecified: Secondary | ICD-10-CM | POA: Insufficient documentation

## 2023-01-13 DIAGNOSIS — R0602 Shortness of breath: Secondary | ICD-10-CM | POA: Diagnosis not present

## 2023-01-13 DIAGNOSIS — I2510 Atherosclerotic heart disease of native coronary artery without angina pectoris: Secondary | ICD-10-CM | POA: Insufficient documentation

## 2023-01-13 DIAGNOSIS — Z8546 Personal history of malignant neoplasm of prostate: Secondary | ICD-10-CM | POA: Insufficient documentation

## 2023-01-13 DIAGNOSIS — Z87891 Personal history of nicotine dependence: Secondary | ICD-10-CM | POA: Insufficient documentation

## 2023-01-13 DIAGNOSIS — R935 Abnormal findings on diagnostic imaging of other abdominal regions, including retroperitoneum: Secondary | ICD-10-CM

## 2023-01-13 DIAGNOSIS — R0789 Other chest pain: Secondary | ICD-10-CM | POA: Diagnosis not present

## 2023-01-13 DIAGNOSIS — Z8616 Personal history of COVID-19: Secondary | ICD-10-CM | POA: Insufficient documentation

## 2023-01-13 DIAGNOSIS — I10 Essential (primary) hypertension: Secondary | ICD-10-CM | POA: Diagnosis not present

## 2023-01-13 DIAGNOSIS — K769 Liver disease, unspecified: Secondary | ICD-10-CM | POA: Diagnosis not present

## 2023-01-13 DIAGNOSIS — Z79899 Other long term (current) drug therapy: Secondary | ICD-10-CM | POA: Insufficient documentation

## 2023-01-13 DIAGNOSIS — R079 Chest pain, unspecified: Secondary | ICD-10-CM | POA: Diagnosis not present

## 2023-01-13 DIAGNOSIS — R1900 Intra-abdominal and pelvic swelling, mass and lump, unspecified site: Secondary | ICD-10-CM

## 2023-01-13 DIAGNOSIS — Z7902 Long term (current) use of antithrombotics/antiplatelets: Secondary | ICD-10-CM | POA: Diagnosis not present

## 2023-01-13 DIAGNOSIS — I700 Atherosclerosis of aorta: Secondary | ICD-10-CM | POA: Diagnosis not present

## 2023-01-13 DIAGNOSIS — Z955 Presence of coronary angioplasty implant and graft: Secondary | ICD-10-CM | POA: Insufficient documentation

## 2023-01-13 DIAGNOSIS — R1084 Generalized abdominal pain: Secondary | ICD-10-CM | POA: Diagnosis not present

## 2023-01-13 DIAGNOSIS — S2232XA Fracture of one rib, left side, initial encounter for closed fracture: Secondary | ICD-10-CM | POA: Diagnosis not present

## 2023-01-13 DIAGNOSIS — E279 Disorder of adrenal gland, unspecified: Secondary | ICD-10-CM | POA: Diagnosis not present

## 2023-01-13 DIAGNOSIS — N281 Cyst of kidney, acquired: Secondary | ICD-10-CM | POA: Diagnosis not present

## 2023-01-13 DIAGNOSIS — K639 Disease of intestine, unspecified: Secondary | ICD-10-CM | POA: Diagnosis not present

## 2023-01-13 LAB — RESP PANEL BY RT-PCR (RSV, FLU A&B, COVID)  RVPGX2
Influenza A by PCR: NEGATIVE
Influenza B by PCR: NEGATIVE
Resp Syncytial Virus by PCR: NEGATIVE
SARS Coronavirus 2 by RT PCR: NEGATIVE

## 2023-01-13 LAB — CBC
HCT: 48.5 % (ref 39.0–52.0)
Hemoglobin: 16.3 g/dL (ref 13.0–17.0)
MCH: 31 pg (ref 26.0–34.0)
MCHC: 33.6 g/dL (ref 30.0–36.0)
MCV: 92.2 fL (ref 80.0–100.0)
Platelets: 261 10*3/uL (ref 150–400)
RBC: 5.26 MIL/uL (ref 4.22–5.81)
RDW: 12.8 % (ref 11.5–15.5)
WBC: 11.8 10*3/uL — ABNORMAL HIGH (ref 4.0–10.5)
nRBC: 0 % (ref 0.0–0.2)

## 2023-01-13 LAB — TROPONIN I (HIGH SENSITIVITY)
Troponin I (High Sensitivity): 6 ng/L (ref <18)
Troponin I (High Sensitivity): 6 ng/L (ref <18)

## 2023-01-13 LAB — BASIC METABOLIC PANEL
Anion gap: 8 (ref 5–15)
BUN: 14 mg/dL (ref 8–23)
CO2: 28 mmol/L (ref 22–32)
Calcium: 9.5 mg/dL (ref 8.9–10.3)
Chloride: 103 mmol/L (ref 98–111)
Creatinine, Ser: 0.67 mg/dL (ref 0.61–1.24)
GFR, Estimated: >60 mL/min (ref >60)
Glucose, Bld: 109 mg/dL — ABNORMAL HIGH (ref 70–99)
Potassium: 4 mmol/L (ref 3.5–5.1)
Sodium: 139 mmol/L (ref 135–145)

## 2023-01-13 MED ORDER — LATANOPROST 0.005 % OP SOLN
1.0000 [drp] | Freq: Every day | OPHTHALMIC | Status: DC
  Administered 2023-01-13: 1 [drp] via OPHTHALMIC
  Filled 2023-01-13: qty 2.5

## 2023-01-13 MED ORDER — IOHEXOL 300 MG/ML  SOLN: 100.0000 mL | Freq: Once | INTRAMUSCULAR | Status: DC | PRN

## 2023-01-13 MED ORDER — ONDANSETRON HCL 4 MG/2ML IJ SOLN: 4.0000 mg | Freq: Four times a day (QID) | INTRAMUSCULAR | Status: DC | PRN

## 2023-01-13 MED ORDER — LOSARTAN POTASSIUM 25 MG PO TABS
25.0000 mg | ORAL_TABLET | Freq: Two times a day (BID) | ORAL | Status: DC
  Administered 2023-01-13 – 2023-01-14 (×2): 25 mg via ORAL
  Filled 2023-01-13 (×2): qty 1

## 2023-01-13 MED ORDER — ENOXAPARIN SODIUM 40 MG/0.4ML IJ SOSY
40.0000 mg | PREFILLED_SYRINGE | INTRAMUSCULAR | Status: DC
  Administered 2023-01-13: 40 mg via SUBCUTANEOUS
  Filled 2023-01-13: qty 0.4

## 2023-01-13 MED ORDER — IOHEXOL 350 MG/ML SOLN
100.0000 mL | Freq: Once | INTRAVENOUS | Status: AC | PRN
  Administered 2023-01-13: 100 mL via INTRAVENOUS

## 2023-01-13 MED ORDER — CLOPIDOGREL BISULFATE 75 MG PO TABS
75.0000 mg | ORAL_TABLET | Freq: Every day | ORAL | Status: DC
  Administered 2023-01-14: 75 mg via ORAL
  Filled 2023-01-13: qty 1

## 2023-01-13 MED ORDER — CARVEDILOL 6.25 MG PO TABS
6.2500 mg | ORAL_TABLET | Freq: Two times a day (BID) | ORAL | Status: DC
  Administered 2023-01-13 – 2023-01-14 (×2): 6.25 mg via ORAL
  Filled 2023-01-13 (×2): qty 1

## 2023-01-13 MED ORDER — NITROGLYCERIN 0.4 MG SL SUBL
0.4000 mg | SUBLINGUAL_TABLET | SUBLINGUAL | Status: DC | PRN
  Administered 2023-01-13: 0.4 mg via SUBLINGUAL
  Filled 2023-01-13: qty 1

## 2023-01-13 MED ORDER — ACETAMINOPHEN 650 MG RE SUPP: 650.0000 mg | Freq: Four times a day (QID) | RECTAL | Status: DC | PRN

## 2023-01-13 MED ORDER — MAGNESIUM HYDROXIDE 400 MG/5ML PO SUSP: 30.0000 mL | Freq: Every day | ORAL | Status: DC | PRN

## 2023-01-13 MED ORDER — TRAZODONE HCL 50 MG PO TABS: 25.0000 mg | ORAL_TABLET | Freq: Every evening | ORAL | Status: DC | PRN

## 2023-01-13 MED ORDER — SODIUM CHLORIDE (PF) 0.9 % IJ SOLN
INTRAMUSCULAR | Status: AC
  Filled 2023-01-13: qty 50

## 2023-01-13 MED ORDER — CALCIUM CARBONATE ANTACID 500 MG PO CHEW
1.0000 | CHEWABLE_TABLET | Freq: Two times a day (BID) | ORAL | Status: DC
  Administered 2023-01-13 – 2023-01-14 (×2): 200 mg via ORAL
  Filled 2023-01-13 (×2): qty 1

## 2023-01-13 MED ORDER — MORPHINE SULFATE (PF) 4 MG/ML IV SOLN
4.0000 mg | Freq: Once | INTRAVENOUS | Status: AC
  Administered 2023-01-13: 4 mg via INTRAVENOUS
  Filled 2023-01-13: qty 1

## 2023-01-13 MED ORDER — LIDOCAINE VISCOUS HCL 2 % MT SOLN: 15.0000 mL | Freq: Once | OROMUCOSAL | Status: DC

## 2023-01-13 MED ORDER — ATORVASTATIN CALCIUM 40 MG PO TABS
80.0000 mg | ORAL_TABLET | Freq: Every day | ORAL | Status: DC
  Administered 2023-01-14: 80 mg via ORAL
  Filled 2023-01-13: qty 2

## 2023-01-13 MED ORDER — ACETAMINOPHEN 325 MG PO TABS: 650.0000 mg | ORAL_TABLET | Freq: Four times a day (QID) | ORAL | Status: DC | PRN

## 2023-01-13 MED ORDER — TIMOLOL MALEATE 0.5 % OP SOLN
1.0000 [drp] | Freq: Every day | OPHTHALMIC | Status: DC
Start: 2023-01-14 — End: 2023-01-14
  Administered 2023-01-14: 1 [drp] via OPHTHALMIC
  Filled 2023-01-13: qty 5

## 2023-01-13 MED ORDER — ASPIRIN 325 MG PO TABS
325.0000 mg | ORAL_TABLET | Freq: Once | ORAL | Status: AC
  Administered 2023-01-13: 325 mg via ORAL
  Filled 2023-01-13: qty 1

## 2023-01-13 MED ORDER — ALUM & MAG HYDROXIDE-SIMETH 200-200-20 MG/5ML PO SUSP: 30.0000 mL | Freq: Once | ORAL | Status: DC

## 2023-01-13 MED ORDER — SODIUM CHLORIDE 0.9 % IV SOLN: INTRAVENOUS | Status: DC

## 2023-01-13 MED ORDER — LOSARTAN POTASSIUM 25 MG PO TABS: 100.0000 mg | ORAL_TABLET | Freq: Every day | ORAL | Status: DC

## 2023-01-13 MED ORDER — LORAZEPAM 2 MG/ML IJ SOLN
1.0000 mg | Freq: Once | INTRAMUSCULAR | Status: AC | PRN
  Administered 2023-01-13: 1 mg via INTRAVENOUS
  Filled 2023-01-13: qty 1

## 2023-01-13 MED ORDER — ONDANSETRON HCL 4 MG PO TABS: 4.0000 mg | ORAL_TABLET | Freq: Four times a day (QID) | ORAL | Status: DC | PRN

## 2023-01-13 NOTE — ED Provider Notes (Signed)
  [facility] AT [facility] Provider Note   CSN: [id number] Arrival date & time: 01/13/23  1015     History {Add pertinent medical, surgical, social history, OB history to HPI:1} Chief Complaint  Patient presents with   Chest Pain   Shortness of Breath    Todd Patterson is a 72 y.o. male.   Chest Pain Associated symptoms: shortness of breath   Shortness of Breath Associated symptoms: chest pain        Home Medications Prior to Admission medications   Medication Sig Start Date End Date Taking? Authorizing Provider  atorvastatin (LIPITOR) 80 MG tablet TAKE 1 TABLET BY MOUTH EVERY DAY 11/15/22   Donita Brooks, MD  carvedilol (COREG) 6.25 MG tablet TAKE 1 TABLET BY MOUTH 2 TIMES DAILY WITH A MEAL. 06/21/22   Donita Brooks, MD  clopidogrel (PLAVIX) 75 MG tablet TAKE 1 TABLET (75 MG TOTAL) BY MOUTH DAILY. PLEASE REQUEST FUTURE REFILLS FROM PCP. 11/22/22   Donita Brooks, MD  latanoprost (XALATAN) 0.005 % ophthalmic solution Place 1 drop into both eyes at bedtime.    [provider]  losartan (COZAAR) 100 MG tablet Take 1 tablet (100 mg total) by mouth daily. 12/26/22   Donita Brooks, MD      Allergies    Patient has no known allergies.    Review of Systems   Review of Systems  Respiratory:  Positive for shortness of breath.   Cardiovascular:  Positive for chest pain.    Physical Exam Updated Vital Signs BP (!) 180/94 (BP Location: Right Arm)   Pulse 64   Temp 98 F (36.7 C) (Oral)   Resp 16   Ht 5\' 10"  (1.778 m)   Wt 98 kg   SpO2 99%   BMI 30.99 kg/m  Physical Exam  ED Results / Procedures / Treatments   Labs (all labs ordered are listed, but only abnormal results are displayed) Labs Reviewed  BASIC METABOLIC PANEL  CBC  TROPONIN I (HIGH SENSITIVITY)    EKG None  Radiology No results found.  Procedures Procedures  {Document cardiac monitor, telemetry assessment procedure when  appropriate:1}  Medications Ordered in ED Medications - No data to display  ED Course/ Medical Decision Making/ A&P   {   Click here for ABCD2, HEART and other calculatorsREFRESH Note before signing :1}                              Medical Decision Making Amount and/or Complexity of Data Reviewed Labs: ordered. Radiology: ordered.  Risk OTC drugs. Prescription drug management.   ***  {Document critical care time when appropriate:1} {Document review of labs and clinical decision tools ie heart score, Chads2Vasc2 etc:1}  {Document your independent review of radiology images, and any outside records:1} {Document your discussion with family members, caretakers, and with consultants:1} {Document social determinants of health affecting pt's care:1} {Document your decision making why or why not admission, treatments were needed:1} Final Clinical Impression(s) / ED Diagnoses Final diagnoses:  None    Rx / DC Orders ED Discharge Orders     None

## 2023-01-13 NOTE — Assessment & Plan Note (Signed)
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 - MRI of the abdomen with and without contrast will be obtained for further assessment.

## 2023-01-13 NOTE — Assessment & Plan Note (Signed)
-   We will continue his antihypertensive therapy. 

## 2023-01-13 NOTE — Assessment & Plan Note (Signed)
-   We will continue statin therapy and check fasting lipids. 

## 2023-01-13 NOTE — H&P (Addendum)
[place]   PATIENT NAME: Todd Patterson    MR#:  [id number]  DATE OF BIRTH:  1950-06-09  DATE OF ADMISSION:  01/13/2023  PRIMARY CARE PHYSICIAN: Donita Brooks, MD   Patient is coming from: Home  REQUESTING/REFERRING PHYSICIAN: Ernie Avena, MD  CHIEF COMPLAINT:   Chief Complaint  Patient presents with   Chest Pain   Shortness of Breath    HISTORY OF PRESENT ILLNESS:  Todd Patterson is a 72 y.o. Caucasian male with medical history significant for hypertension, dyslipidemia, prostate cancer and coronary artery disease status post PCI and stent as well as osteoarthritis, who presented to the emergency room with acute onset of midsternal chest pressure graded 6/10 in severity with radiation to his back with associated occasional diaphoresis without nausea or vomiting.  His pain is started Saturday evening.  He denied any cough or wheezing or hemoptysis.  No leg pain or edema or recent travels or surgeries.  No dysuria, oliguria or hematuria or flank pain.  No other bleeding diathesis.  ED Course: When he came to the ER, BP was 180/94 with otherwise normal vital signs.  Labs revealed unremarkable BMP.  CBC showed no leukocytosis of 11.8.  Respiratory panel came back negative. EKG as reviewed by me : EKG showed normal sinus rhythm with a rate of 66 with abnormal R wave progression. Imaging: Two-view chest x-ray showed no acute cardiopulmonary disease. CTA of the chest, abdomen and pelvis revealed the following: Diffuse calcified atherosclerotic plaque. No dissection or aneurysm formation identified.   No consolidation, pneumothorax or effusion. Some chronic lung changes.   3.9 cm cystic lesion identified in the right lower quadrant caudal to the cecum. This appears separate from the terminal ileum but the appendix is not seen as a separate structure. Possibilities would include an appendiceal lesion such as a mucocele or other cystic mass lesion. Overall recommend  further evaluation of the structure.   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 Please correlate with any prior or dedicated workup of the left adrenal gland when clinically appropriate such as MRI or pre and postcontrast washout CT.   Separate hepatic, renal and splenic cysts   Hypervascular right hepatic lobe liver lesion measuring 12 mm. This also has a differential. This also could be assessed on a follow up dynamic MRI with and without contrast. Please correlate with any prior as well.  The patient was given 325 mg p.o. aspirin, 4 mg of IV morphine sulfate and 0.4 mg sublingual nitroglycerin.  He was fairly comfortable during my interview.  He will be admitted to an observation medical telemetry bed for further evaluation and management. 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   neurologist-- dr Terrace Arabia;  09-05-2017 acute onset right 6th nerve palsy with ischemic event,  started on plavix;  06/ 2021 left 6th nerve palsy probable same etiology as the right   Anticoagulant long-term use    plavix---  managed by pcp   Arthritis    Colon polyps    Coronary artery disease    cardiologist--- dr Allyson Sabal; 2010  hx MI s/p cath with DES x1  to LAD per cardiology note done in [place]   Dental bridge present    x2 upper  both permanent   Family history of breast cancer    Glaucoma, both eyes  History of COVID-19 03/21/2020   result in epic   History of myocardial infarction 2010   PCI w/ DES to LAD   Hx of colonic polyps    colonoscopy 2018, recommended repeat in 1 year due to numbe rof polyps.    Hyperlipidemia    Hypertension    Malignant neoplasm prostate [facility])    urologist-- dr gay;  dx 06/ 2022, gleason 3+3, PSA 8.82   Polyposis coli    Primary hyperparathyroidism (HCC)    S/P drug eluting coronary stent placement 2010    to LAD    PAST SURGICAL HISTORY:   Past Surgical History:  Procedure Laterality Date   APPENDECTOMY  1974   age 43   CORONARY ANGIOPLASTY WITH STENT PLACEMENT  2010   DES x1 LAD  (in [place])   CYSTOSCOPY N/A 01/15/2021   Procedure: CYSTOSCOPY FLEXIBLE;  Surgeon: Jannifer Hick, MD;  Location: [facility];  Service: Urology;  Laterality: N/A;  No seeds detected in bladder per Dr. Cardell Peach   PARATHYROIDECTOMY N/A 07/04/2016   Procedure: PARATHYROIDECTOMY;  Surgeon: Avel Peace, MD;  Location: [facility];  Service: General;  Laterality: N/A;   RADIOACTIVE SEED IMPLANT N/A 01/15/2021   Procedure: RADIOACTIVE SEED IMPLANT/BRACHYTHERAPY IMPLANT;  Surgeon: Jannifer Hick, MD;  Location: [facility];  Service: Urology;  Laterality: N/A;   SPACE OAR INSTILLATION N/A 01/15/2021   Procedure: SPACE OAR INSTILLATION;  Surgeon: Jannifer Hick, MD;  Location: [facility];  Service: Urology;  Laterality: N/A;   TONSILLECTOMY AND ADENOIDECTOMY  1970   age 74    SOCIAL HISTORY:   Social History   Tobacco Use   Smoking status: Former    Current packs/day: 0.00    Average packs/day: 2.0 packs/day for 42.0 years (84.0 ttl pk-yrs)    Types: Cigarettes    Start date: 109    Quit date: 2010    Years since quitting: 14.8   Smokeless tobacco: Never  Substance Use Topics   Alcohol use: Yes    Alcohol/week: 14.0 - 21.0 standard drinks of alcohol    Types: 14 - 21 Cans of beer per week    Comment: 01-09-2021  pt stated 2-3 (12oz) beers daily    FAMILY HISTORY:   Family History  Problem Relation Age of Onset   Dementia Mother    Heart disease Father    Cancer Sister        d. 35   Breast cancer Sister 41       d. 70   Cancer Niece        NOS, d. 71   Throat cancer Nephew        d. 50    DRUG ALLERGIES:   Allergies  Allergen Reactions   Lisinopril Cough    REVIEW OF SYSTEMS:   ROS As per history of present illness. All pertinent systems  were reviewed above. Constitutional, HEENT, cardiovascular, respiratory, GI, GU, musculoskeletal, neuro, psychiatric, endocrine, integumentary and hematologic systems were reviewed and are otherwise negative/unremarkable except for positive findings mentioned above in the HPI.   MEDICATIONS AT HOME:   Prior to Admission medications   Medication Sig Start Date End Date Taking? Authorizing Provider  atorvastatin (LIPITOR) 80 MG tablet TAKE 1 TABLET BY MOUTH EVERY DAY Patient taking differently: Take 80 mg by mouth daily. 11/15/22  Yes Donita Brooks, MD  carvedilol (COREG) 6.25 MG tablet TAKE 1 TABLET BY MOUTH 2 TIMES DAILY WITH A MEAL. 06/21/22  Yes Donita Brooks, MD  clopidogrel (PLAVIX) 75 MG tablet TAKE 1 TABLET (75 MG TOTAL) BY MOUTH DAILY. PLEASE REQUEST FUTURE REFILLS FROM PCP. Patient taking differently: Take 75 mg by mouth in the morning. 11/22/22  Yes Donita Brooks, MD  ibuprofen (ADVIL) 200 MG tablet Take 600 mg by mouth every 6 (six) hours as needed for mild pain (pain score 1-3) or headache.   Yes [provider]  latanoprost (XALATAN) 0.005 % ophthalmic solution Place 1 drop into both eyes at bedtime.   Yes [provider]  losartan (COZAAR) 25 MG tablet Take 25 mg by mouth See admin instructions. Take 25 mg by mouth in the morning and evening   Yes [provider]  timolol (BETIMOL) 0.5 % ophthalmic solution Place 1 drop into both eyes in the morning.   Yes [provider]  losartan (COZAAR) 100 MG tablet Take 1 tablet (100 mg total) by mouth daily. Patient not taking: Reported on 01/13/2023 12/26/22   Donita Brooks, MD      VITAL SIGNS:  Blood pressure (!) 191/78, pulse (!) 58, temperature 98.1 F (36.7 C), temperature source Oral, resp. rate 20, height 5\' 10"  (1.778 m), weight 98 kg, SpO2 96%.  PHYSICAL EXAMINATION:  Physical Exam  GENERAL:  72 y.o.-year-old patient lying in the bed with no acute distress.  EYES: Pupils equal,  round, reactive to light and accommodation. No scleral icterus. Extraocular muscles intact.  HEENT: Head atraumatic, normocephalic. Oropharynx and nasopharynx clear.  NECK:  Supple, no jugular venous distention. No thyroid enlargement, no tenderness.  LUNGS: Normal breath sounds bilaterally, no wheezing, rales,rhonchi or crepitation. No use of accessory muscles of respiration.  CARDIOVASCULAR: Regular rate and rhythm, S1, S2 normal. No murmurs, rubs, or gallops.  ABDOMEN: Soft, nondistended, nontender. Bowel sounds present. No organomegaly or mass.  EXTREMITIES: No pedal edema, cyanosis, or clubbing.  NEUROLOGIC: Cranial nerves II through XII are intact. Muscle strength 5/5 in all extremities. Sensation intact. Gait not checked.  PSYCHIATRIC: The patient is alert and oriented x 3.  Normal affect and good eye contact. SKIN: No obvious rash, lesion, or ulcer.   LABORATORY PANEL:   CBC Recent Labs  Lab 01/13/23 1040  WBC 11.8*  HGB 16.3  HCT 48.5  PLT 261   ------------------------------------------------------------------------------------------------------------------  Chemistries  Recent Labs  Lab 01/13/23 1040  NA 139  K 4.0  CL 103  CO2 28  GLUCOSE 109*  BUN 14  CREATININE 0.67  CALCIUM 9.5   ------------------------------------------------------------------------------------------------------------------  Cardiac Enzymes No results for input(s): "TROPONINI" in the last 168 hours. ------------------------------------------------------------------------------------------------------------------  RADIOLOGY:  CT Angio Chest/Abd/Pel for Dissection W and/or Wo Contrast  Result Date: 01/13/2023 CLINICAL DATA:  Burning chest pain and shortness of breath for 3 days. Previous MI. EXAM: CT ANGIOGRAPHY CHEST, ABDOMEN AND PELVIS TECHNIQUE: Non-contrast CT of the chest was initially obtained. Multidetector CT imaging through the chest, abdomen and pelvis was performed using the  standard protocol during bolus administration of intravenous contrast. Multiplanar reconstructed images and MIPs were obtained and reviewed to evaluate the vascular anatomy. RADIATION DOSE REDUCTION: This exam was performed according to the departmental dose-optimization program which includes automated exposure control, adjustment of the mA and/or kV according to patient size and/or use of iterative reconstruction technique. CONTRAST:  OMNIPAQUE IOHEXOL 350 MG/ML SOLN COMPARISON:  Chest x-ray 03/07/2023 earlier. Ultrasound abdomen 10/26/2021 of the aorta FINDINGS: CTA CHEST FINDINGS Cardiovascular: On the noncontrast dataset no curvilinear high density material along the course of  the thoracic aorta. Scattered coronary and aortic calcified atherosclerotic plaque identified there is also some plaque along the origin of the great vessels. Diameter of the ascending aorta at the level of the right pulmonary artery measures 3.8 by 3.8 cm. The descending thoracic aorta same level measures 2.8 by 2.8 cm. Distal aortic arch diameter of 2.6 cm. Diameter of the very proximal ascending aorta just above the aortic root measures a diameter of 3.2 cm. No dissection or aneurysm formation. Heart is nonenlarged. Trace pericardial fluid. Mediastinum/Nodes: Slightly heterogeneous thyroid gland. Normal caliber thoracic esophagus. No specific abnormal lymph node enlargement identified in the axillary regions, hilum or mediastinum. Only a few small less than 1 cm size nodes are seen in the hilum and mediastinum, nonpathologic by size criteria. Lungs/Pleura: No pneumothorax, effusion or consolidation. Few areas of peripheral ground-glass. There are some peripheral areas of interstitial septal thickening. Scarring and fibrotic changes. No consolidation, pneumothorax or effusion. Musculoskeletal: Osteopenia. Scattered degenerative changes. Old left-sided rib fractures. These are healed. Review of the MIP images confirms the above  findings. CTA ABDOMEN AND PELVIS FINDINGS VASCULAR Aorta: Scattered vascular calcifications identified. No dissection or aneurysm formation. Celiac: Patent without evidence of aneurysm, dissection, vasculitis or significant stenosis. SMA: Patent without evidence of aneurysm, dissection, vasculitis or significant stenosis. Renals: Scattered calcified plaque. There are 2 bilateral renal arteries. IMA: Patent without evidence of aneurysm, dissection, vasculitis or significant stenosis. Inflow: Scattered calcified plaque along the iliac vessels. Mild areas of stenosis seen along the distal left common iliac. Veins: No obvious venous abnormality within the limitations of this arterial phase study. Review of the MIP images confirms the above findings. NON-VASCULAR Hepatobiliary: With the limits of the early phase of the arterial bolus, there is smaller areas of asymmetric enhancement in the right hepatic lobe on series 10, image 142 measuring 13 mm. Probable vascular lesion. Separate benign-appearing cyst in segment 4. Gallbladder is nondilated. Pancreas: Unremarkable. No pancreatic ductal dilatation or surrounding inflammatory changes. Spleen: Multiple benign-appearing splenic cysts. Adrenals/Urinary Tract: Right adrenal gland is preserved. 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 There is however a separate smaller focus more caudal which is more indeterminate measuring 15 mm on series 10, image 143. Not clearly a benign lesion. Nonspecific perinephric stranding. No enhancing renal mass or collecting system dilatation. Exophytic Bosniak 1 right-sided renal cyst medial measuring 15 mm with Hounsfield unit of close to 0 on series 10, image 185. The ureters have normal course and caliber down to the bladder. Preserved contours of the urinary bladder. Stomach/Bowel: On this non oral contrast exam, the large bowel has a normal course and caliber. Stomach and small bowel  are nondilated. Just caudal to the base of the cecum but separate from the terminal ileum is a cystic lesion. On coronal series 12, image 78 this measures 3.9 cm. Axial series 10, image 265. Based on appearance and location this could be an appendiceal lesion such as a mucocele or other process. Lymphatic: No specific abnormal lymph node enlargement identified in the abdomen and pelvis. Reproductive: Brachytherapy changes in the location of the prostate. Other: No free air or free fluid. Small bilateral fat containing inguinal hernias, left-greater-than-right. Small fat containing umbilical hernia as well. Musculoskeletal: Scattered degenerative changes of the spine and pelvis. Osteopenia. Unilateral left-sided pars defect at L5 with trace listhesis. Review of the MIP images confirms the above findings. IMPRESSION: Diffuse calcified atherosclerotic plaque. No dissection or aneurysm formation identified. No consolidation, pneumothorax or effusion.  Some chronic lung changes. 3.9 cm cystic lesion identified in the right lower quadrant caudal to the cecum. This appears separate from the terminal ileum but the appendix is not seen as a separate structure. Possibilities would include an appendiceal lesion such as a mucocele or other cystic mass lesion. Overall recommend further evaluation of the structure. 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 Please correlate with any prior or dedicated workup of the left adrenal gland when clinically appropriate such as MRI or pre and postcontrast washout CT. Separate hepatic, renal and splenic cysts Hypervascular right hepatic lobe liver lesion measuring 12 mm. This also has a differential. This also could be assessed on a follow up dynamic MRI with and without contrast. Please correlate with any prior as well. Electronically Signed   By: Karen Kays M.D.   On: 01/13/2023 16:31   DG  Chest 2 View  Result Date: 01/13/2023 CLINICAL DATA:  Chest pain. EXAM: CHEST - 2 VIEW COMPARISON:  Chest radiograph dated December 14, 2020. FINDINGS: The heart size and mediastinal contours are within normal limits. Aortic atherosclerosis. Both lungs are clear. No pneumothorax or pleural effusion. Remote healed left posterior seventh rib fracture. No acute osseous abnormality. IMPRESSION: No acute cardiopulmonary findings. Electronically Signed   By: Hart Robinsons M.D.   On: 01/13/2023 11:49      IMPRESSION AND PLAN:  Assessment and Plan: * Chest pain - The patient will be admitted to an observation cardiac telemetry bed. - Will follow serial troponins and EKGs. - The patient will be placed on aspirin as well as p.r.n. sublingual nitroglycerin and morphine sulfate for pain. - We will obtain a cardiology consult in a.m. for further cardiac risk stratification. - I notified Salley Hews about the patient   Abdominal mass - This includes left adrenal mass and right hepatic lobe liver lesion and cystic appendiceal lesion. - MRI of the abdomen with and without contrast will be obtained for further assessment.  Coronary artery disease - We will continue ARB therapy, beta-blocker therapy, statin therapy and Plavix.  Dyslipidemia - We will continue statin therapy and check fasting lipids.  Essential hypertension - We will continue his antihypertensive therapy.   DVT prophylaxis: Lovenox.  Advanced Care Planning:  Code Status: full code.  Family Communication:  The plan of care was discussed in details with the patient (and family). I answered all questions. The patient agreed to proceed with the above mentioned plan. Further management will depend upon hospital course. Disposition Plan: Back to previous home environment Consults called: Cardiology All the records are reviewed and case discussed with ED provider.  Status is: Observation   I certify that at the time of  admission, it is my clinical judgment that the patient will require  hospital care extending less than 2 midnights.                            Dispo: The patient is from: Home              Anticipated d/c is to: Home              Patient currently is not medically stable to d/c.              Difficult to place patient: No  Hannah Beat M.D on 01/13/2023 at 7:57 PM  [facility]  From 7 PM-7 AM, contact night-coverage [email or web address]  CC: Primary care physician; Donita Brooks, MD

## 2023-01-13 NOTE — Assessment & Plan Note (Signed)
-   We will continue ARB therapy, beta-blocker therapy, statin therapy and Plavix.

## 2023-01-13 NOTE — ED Notes (Signed)
ED TO INPATIENT HANDOFF REPORT  Name/Age/Gender Todd Patterson 72 y.o. male  Code Status Code Status History     Date Active Date Inactive Code Status Order ID Comments User Context   07/04/2016 1430 07/05/2016 1347 Full Code [id number]  Avel Peace, MD Inpatient       Home/SNF/Other Home  Chief Complaint Chest pain [R07.9]  Level of Care/Admitting Diagnosis ED Disposition     ED Disposition  Admit   Condition  --   Comment  Hospital Area: [facility] [[id number]]  Level of Care: Telemetry [5]  Admit to tele based on following criteria: Other see comments  Comments: Cp  May place patient in observation at [facility] or [facility] if equivalent level of care is available:: No  Covid Evaluation: Asymptomatic - no recent exposure (last 10 days) testing not required  Diagnosis: Chest pain [409811]  Admitting Physician: Hannah Beat [[id number]]  Attending Physician: Hannah Beat [[id number]]          Medical History 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   neurologist-- dr Terrace Arabia;  09-05-2017 acute onset right 6th nerve palsy with ischemic event,  started on plavix;  06/ 2021 left 6th nerve palsy probable same etiology as the right   Anticoagulant long-term use    plavix---  managed by pcp   Arthritis    Colon polyps    Coronary artery disease    cardiologist--- dr Allyson Sabal; 2010  hx MI s/p cath with DES x1  to LAD per cardiology note done in [place]   Dental bridge present    x2 upper  both permanent   Family history of breast cancer    Glaucoma, both eyes    History of COVID-19 03/21/2020   result in epic   History of myocardial infarction 2010   PCI w/ DES to LAD   Hx of colonic polyps    colonoscopy 2018, recommended repeat in 1 year due to numbe rof polyps.    Hyperlipidemia    Hypertension    Malignant neoplasm prostate [facility])    urologist-- dr gay;  dx 06/ 2022, gleason 3+3, PSA 8.82   Polyposis coli     Primary hyperparathyroidism (HCC)    S/P drug eluting coronary stent placement 2010   to LAD    Allergies No Known Allergies  IV Location/Drains/Wounds Patient Lines/Drains/Airways Status     Active Line/Drains/Airways     Name Placement date Placement time Site Days   Peripheral IV 09/05/17 Right;Medial Forearm 09/05/17  2020  Forearm  1956   Peripheral IV 01/13/23 20 G 1" Anterior;Distal;Right;Upper Arm 01/13/23  1040  Arm  less than 1   Incision (Closed) 07/04/16 Neck Other (Comment) 07/04/16  1159  -- 2384   Incision (Closed) 01/15/21 Perineum Other (Comment) 01/15/21  1256  -- 728            Labs/Imaging Results for orders placed or performed during the hospital encounter of 01/13/23 (from the past 48 hour(s))  Basic metabolic panel     Status: Abnormal   Collection Time: 01/13/23 10:40 AM  Result Value Ref Range   Sodium 139 135 - 145 mmol/L   Potassium 4.0 3.5 - 5.1 mmol/L   Chloride 103 98 - 111 mmol/L   CO2 28 22 - 32 mmol/L   Glucose, Bld 109 (H) 70 - 99 mg/dL    Comment: Glucose reference range applies only to samples taken after fasting for  at least 8 hours.   BUN 14 8 - 23 mg/dL   Creatinine, Ser 4.01 0.61 - 1.24 mg/dL   Calcium 9.5 8.9 - 02.7 mg/dL   GFR, Estimated >25 >36 mL/min    Comment: (NOTE) Calculated using the CKD-EPI Creatinine Equation (2021)    Anion gap 8 5 - 15    Comment: Performed at [facility], [address], [place], [place] [address]  CBC     Status: Abnormal   Collection Time: 01/13/23 10:40 AM  Result Value Ref Range   WBC 11.8 (H) 4.0 - 10.5 K/uL   RBC 5.26 4.22 - 5.81 MIL/uL   Hemoglobin 16.3 13.0 - 17.0 g/dL   HCT 47.4 25.9 - 56.3 %   MCV 92.2 80.0 - 100.0 fL   MCH 31.0 26.0 - 34.0 pg   MCHC 33.6 30.0 - 36.0 g/dL   RDW 87.5 64.3 - 32.9 %   Platelets 261 150 - 400 K/uL   nRBC 0.0 0.0 - 0.2 %    Comment: Performed at [facility], [address], [place], [place] [address]   Troponin I (High Sensitivity)     Status: None   Collection Time: 01/13/23 10:40 AM  Result Value Ref Range   Troponin I (High Sensitivity) 6 <18 ng/L    Comment: (NOTE) Elevated high sensitivity troponin I (hsTnI) values and significant  changes across serial measurements may suggest ACS but many other  chronic and acute conditions are known to elevate hsTnI results.  Refer to the "Links" section for chest pain algorithms and additional  guidance. Performed at [facility], [address] [place], [place] [address]   Resp panel by RT-PCR (RSV, Flu A&B, Covid) Anterior Nasal Swab     Status: None   Collection Time: 01/13/23 12:13 PM   Specimen: Anterior Nasal Swab  Result Value Ref Range   SARS Coronavirus 2 by RT PCR NEGATIVE NEGATIVE    Comment: (NOTE) SARS-CoV-2 target nucleic acids are NOT DETECTED.  The SARS-CoV-2 RNA is generally detectable in upper respiratory specimens during the acute phase of infection. The lowest concentration of SARS-CoV-2 viral copies this assay can detect is 138 copies/mL. A negative result does not preclude SARS-Cov-2 infection and should not be used as the sole basis for treatment or other patient management decisions. A negative result may occur with  improper specimen collection/handling, submission of specimen other than nasopharyngeal swab, presence of viral mutation(s) within the areas targeted by this assay, and inadequate number of viral copies(<138 copies/mL). A negative result must be combined with clinical observations, patient history, and epidemiological information. The expected result is Negative.  Fact Sheet for Patients:  BloggerCourse.com  Fact Sheet for Healthcare Providers:  SeriousBroker.it  This test is no t yet approved or cleared by the [place] FDA and  has been authorized for detection and/or diagnosis of SARS-CoV-2 by FDA under an Emergency  Use Authorization (EUA). This EUA will remain  in effect (meaning this test can be used) for the duration of the COVID-19 declaration under Section 564(b)(1) of the Act, 21 U.S.C.section 360bbb-3(b)(1), unless the authorization is terminated  or revoked sooner.       Influenza A by PCR NEGATIVE NEGATIVE   Influenza B by PCR NEGATIVE NEGATIVE    Comment: (NOTE) The Xpert Xpress SARS-CoV-2/FLU/RSV plus assay is intended as an aid in the diagnosis of influenza from Nasopharyngeal swab specimens and should not be used as a sole basis for treatment.  Nasal washings and aspirates are unacceptable for Xpert Xpress SARS-CoV-2/FLU/RSV testing.  Fact Sheet for Patients: BloggerCourse.com  Fact Sheet for Healthcare Providers: SeriousBroker.it  This test is not yet approved or cleared by the [place] FDA and has been authorized for detection and/or diagnosis of SARS-CoV-2 by FDA under an Emergency Use Authorization (EUA). This EUA will remain in effect (meaning this test can be used) for the duration of the COVID-19 declaration under Section 564(b)(1) of the Act, 21 U.S.C. section 360bbb-3(b)(1), unless the authorization is terminated or revoked.     Resp Syncytial Virus by PCR NEGATIVE NEGATIVE    Comment: (NOTE) Fact Sheet for Patients: BloggerCourse.com  Fact Sheet for Healthcare Providers: SeriousBroker.it  This test is not yet approved or cleared by the [place] FDA and has been authorized for detection and/or diagnosis of SARS-CoV-2 by FDA under an Emergency Use Authorization (EUA). This EUA will remain in effect (meaning this test can be used) for the duration of the COVID-19 declaration under Section 564(b)(1) of the Act, 21 U.S.C. section 360bbb-3(b)(1), unless the authorization is terminated or revoked.  Performed at [facility], [address] [place], [place] [address]   Troponin I (High Sensitivity)     Status: None   Collection Time: 01/13/23 12:46 PM  Result Value Ref Range   Troponin I (High Sensitivity) 6 <18 ng/L    Comment: (NOTE) Elevated high sensitivity troponin I (hsTnI) values and significant  changes across serial measurements may suggest ACS but many other  chronic and acute conditions are known to elevate hsTnI results.  Refer to the "Links" section for chest pain algorithms and additional  guidance. Performed at [facility], [address], [place], [place] [address]    CT Angio Chest/Abd/Pel for Dissection W and/or Wo Contrast  Result Date: 01/13/2023 CLINICAL DATA:  Burning chest pain and shortness of breath for 3 days. Previous MI. EXAM: CT ANGIOGRAPHY CHEST, ABDOMEN AND PELVIS TECHNIQUE: Non-contrast CT of the chest was initially obtained. Multidetector CT imaging through the chest, abdomen and pelvis was performed using the standard protocol during bolus administration of intravenous contrast. Multiplanar reconstructed images and MIPs were obtained and reviewed to evaluate the vascular anatomy. RADIATION DOSE REDUCTION: This exam was performed according to the departmental dose-optimization program which includes automated exposure control, adjustment of the mA and/or kV according to patient size and/or use of iterative reconstruction technique. CONTRAST:  OMNIPAQUE IOHEXOL 350 MG/ML SOLN COMPARISON:  Chest x-ray 03/07/2023 earlier. Ultrasound abdomen 10/26/2021 of the aorta FINDINGS: CTA CHEST FINDINGS Cardiovascular: On the noncontrast dataset no curvilinear high density material along the course of the thoracic aorta. Scattered coronary and aortic calcified atherosclerotic plaque identified there is also some plaque along the origin of the great vessels. Diameter of the ascending aorta at the level of the right pulmonary artery measures 3.8 by 3.8 cm. The descending thoracic aorta same  level measures 2.8 by 2.8 cm. Distal aortic arch diameter of 2.6 cm. Diameter of the very proximal ascending aorta just above the aortic root measures a diameter of 3.2 cm. No dissection or aneurysm formation. Heart is nonenlarged. Trace pericardial fluid. Mediastinum/Nodes: Slightly heterogeneous thyroid gland. Normal caliber thoracic esophagus. No specific abnormal lymph node enlargement identified in the axillary regions, hilum or mediastinum. Only a few small less than 1 cm size nodes are seen in the hilum and mediastinum, nonpathologic by size criteria. Lungs/Pleura: No pneumothorax, effusion or consolidation. Few areas of peripheral ground-glass. There are some  peripheral areas of interstitial septal thickening. Scarring and fibrotic changes. No consolidation, pneumothorax or effusion. Musculoskeletal: Osteopenia. Scattered degenerative changes. Old left-sided rib fractures. These are healed. Review of the MIP images confirms the above findings. CTA ABDOMEN AND PELVIS FINDINGS VASCULAR Aorta: Scattered vascular calcifications identified. No dissection or aneurysm formation. Celiac: Patent without evidence of aneurysm, dissection, vasculitis or significant stenosis. SMA: Patent without evidence of aneurysm, dissection, vasculitis or significant stenosis. Renals: Scattered calcified plaque. There are 2 bilateral renal arteries. IMA: Patent without evidence of aneurysm, dissection, vasculitis or significant stenosis. Inflow: Scattered calcified plaque along the iliac vessels. Mild areas of stenosis seen along the distal left common iliac. Veins: No obvious venous abnormality within the limitations of this arterial phase study. Review of the MIP images confirms the above findings. NON-VASCULAR Hepatobiliary: With the limits of the early phase of the arterial bolus, there is smaller areas of asymmetric enhancement in the right hepatic lobe on series 10, image 142 measuring 13 mm. Probable vascular lesion. Separate  benign-appearing cyst in segment 4. Gallbladder is nondilated. Pancreas: Unremarkable. No pancreatic ductal dilatation or surrounding inflammatory changes. Spleen: Multiple benign-appearing splenic cysts. Adrenals/Urinary Tract: Right adrenal gland is preserved. 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 There is however a separate smaller focus more caudal which is more indeterminate measuring 15 mm on series 10, image 143. Not clearly a benign lesion. Nonspecific perinephric stranding. No enhancing renal mass or collecting system dilatation. Exophytic Bosniak 1 right-sided renal cyst medial measuring 15 mm with Hounsfield unit of close to 0 on series 10, image 185. The ureters have normal course and caliber down to the bladder. Preserved contours of the urinary bladder. Stomach/Bowel: On this non oral contrast exam, the large bowel has a normal course and caliber. Stomach and small bowel are nondilated. Just caudal to the base of the cecum but separate from the terminal ileum is a cystic lesion. On coronal series 12, image 78 this measures 3.9 cm. Axial series 10, image 265. Based on appearance and location this could be an appendiceal lesion such as a mucocele or other process. Lymphatic: No specific abnormal lymph node enlargement identified in the abdomen and pelvis. Reproductive: Brachytherapy changes in the location of the prostate. Other: No free air or free fluid. Small bilateral fat containing inguinal hernias, left-greater-than-right. Small fat containing umbilical hernia as well. Musculoskeletal: Scattered degenerative changes of the spine and pelvis. Osteopenia. Unilateral left-sided pars defect at L5 with trace listhesis. Review of the MIP images confirms the above findings. IMPRESSION: Diffuse calcified atherosclerotic plaque. No dissection or aneurysm formation identified. No consolidation, pneumothorax or effusion. Some chronic lung changes. 3.9  cm cystic lesion identified in the right lower quadrant caudal to the cecum. This appears separate from the terminal ileum but the appendix is not seen as a separate structure. Possibilities would include an appendiceal lesion such as a mucocele or other cystic mass lesion. Overall recommend further evaluation of the structure. 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 Please correlate with any prior or dedicated workup of the left adrenal gland when clinically appropriate such as MRI or pre and postcontrast washout CT. Separate hepatic, renal and splenic cysts Hypervascular right hepatic lobe liver lesion measuring 12 mm. This also has a differential. This also could be assessed on a follow up dynamic MRI with and without contrast. Please correlate with any prior as well.  Electronically Signed   By: Karen Kays M.D.   On: 01/13/2023 16:31   DG Chest 2 View  Result Date: 01/13/2023 CLINICAL DATA:  Chest pain. EXAM: CHEST - 2 VIEW COMPARISON:  Chest radiograph dated December 14, 2020. FINDINGS: The heart size and mediastinal contours are within normal limits. Aortic atherosclerosis. Both lungs are clear. No pneumothorax or pleural effusion. Remote healed left posterior seventh rib fracture. No acute osseous abnormality. IMPRESSION: No acute cardiopulmonary findings. Electronically Signed   By: Hart Robinsons M.D.   On: 01/13/2023 11:49    Pending Labs Unresulted Labs (From admission, onward)    None       Vitals/Pain Today's Vitals   01/13/23 1301 01/13/23 1338 01/13/23 1515 01/13/23 1648  BP:  (!) 174/88  (!) 169/71  Pulse:  63  (!) 57  Resp:  11  14  Temp:  98 F (36.7 C)  97.9 F (36.6 C)  TempSrc:  Oral  Oral  SpO2:  93%  91%  Weight:      Height:      PainSc: 1   2      Isolation Precautions No active isolations  Medications Medications  nitroGLYCERIN (NITROSTAT) SL tablet 0.4  mg (0.4 mg Sublingual Given 01/13/23 1301)  morphine (PF) 4 MG/ML injection 4 mg (4 mg Intravenous Given 01/13/23 1246)  aspirin tablet 325 mg (325 mg Oral Given 01/13/23 1301)  iohexol (OMNIPAQUE) 350 MG/ML injection 100 mL (100 mLs Intravenous Contrast Given 01/13/23 1315)    Mobility walks

## 2023-01-13 NOTE — ED Triage Notes (Signed)
Pt arrived via POV. C/o  burning chest pain, and SOB for 3x days. Previous hx of heart attack.   AOx4

## 2023-01-13 NOTE — Assessment & Plan Note (Addendum)
-   The patient will be admitted to an observation cardiac telemetry bed. - Will follow serial troponins and EKGs. - The patient will be placed on aspirin as well as p.r.n. sublingual nitroglycerin and morphine sulfate for pain. - We will obtain a cardiology consult in a.m. for further cardiac risk stratification. - I notified Patricia Trent about the patient. 

## 2023-01-13 NOTE — Plan of Care (Signed)

## 2023-01-14 ENCOUNTER — Telehealth: Payer: Self-pay

## 2023-01-14 ENCOUNTER — Other Ambulatory Visit: Payer: Self-pay | Admitting: Physician Assistant

## 2023-01-14 ENCOUNTER — Observation Stay (HOSPITAL_BASED_OUTPATIENT_CLINIC_OR_DEPARTMENT_OTHER): Payer: Medicare HMO

## 2023-01-14 ENCOUNTER — Other Ambulatory Visit (HOSPITAL_COMMUNITY): Payer: Self-pay

## 2023-01-14 ENCOUNTER — Telehealth: Payer: Self-pay | Admitting: Physician Assistant

## 2023-01-14 DIAGNOSIS — N289 Disorder of kidney and ureter, unspecified: Secondary | ICD-10-CM | POA: Diagnosis not present

## 2023-01-14 DIAGNOSIS — I2510 Atherosclerotic heart disease of native coronary artery without angina pectoris: Secondary | ICD-10-CM

## 2023-01-14 DIAGNOSIS — R079 Chest pain, unspecified: Secondary | ICD-10-CM | POA: Diagnosis not present

## 2023-01-14 DIAGNOSIS — I2585 Chronic coronary microvascular dysfunction: Secondary | ICD-10-CM

## 2023-01-14 DIAGNOSIS — K769 Liver disease, unspecified: Secondary | ICD-10-CM | POA: Diagnosis not present

## 2023-01-14 DIAGNOSIS — E785 Hyperlipidemia, unspecified: Secondary | ICD-10-CM | POA: Diagnosis not present

## 2023-01-14 DIAGNOSIS — R109 Unspecified abdominal pain: Secondary | ICD-10-CM | POA: Diagnosis not present

## 2023-01-14 DIAGNOSIS — I10 Essential (primary) hypertension: Secondary | ICD-10-CM | POA: Diagnosis not present

## 2023-01-14 DIAGNOSIS — R935 Abnormal findings on diagnostic imaging of other abdominal regions, including retroperitoneum: Secondary | ICD-10-CM | POA: Diagnosis not present

## 2023-01-14 DIAGNOSIS — R072 Precordial pain: Secondary | ICD-10-CM

## 2023-01-14 DIAGNOSIS — R0789 Other chest pain: Secondary | ICD-10-CM

## 2023-01-14 DIAGNOSIS — E278 Other specified disorders of adrenal gland: Secondary | ICD-10-CM | POA: Diagnosis not present

## 2023-01-14 DIAGNOSIS — R1900 Intra-abdominal and pelvic swelling, mass and lump, unspecified site: Secondary | ICD-10-CM | POA: Diagnosis not present

## 2023-01-14 LAB — ECHOCARDIOGRAM COMPLETE
AR max vel: 3.03 cm2
AV Area VTI: 2.93 cm2
AV Area mean vel: 2.83 cm2
AV Mean grad: 3 mm[Hg]
AV Peak grad: 5.2 mm[Hg]
Ao pk vel: 1.14 m/s
Area-P 1/2: 3.08 cm2
Height: 70 in
S' Lateral: 3.3 cm
Weight: 3456 [oz_av]

## 2023-01-14 LAB — CBC
HCT: 43.2 % (ref 39.0–52.0)
Hemoglobin: 14.1 g/dL (ref 13.0–17.0)
MCH: 30.5 pg (ref 26.0–34.0)
MCHC: 32.6 g/dL (ref 30.0–36.0)
MCV: 93.5 fL (ref 80.0–100.0)
Platelets: 221 10*3/uL (ref 150–400)
RBC: 4.62 MIL/uL (ref 4.22–5.81)
RDW: 12.8 % (ref 11.5–15.5)
WBC: 10.2 10*3/uL (ref 4.0–10.5)
nRBC: 0 % (ref 0.0–0.2)

## 2023-01-14 LAB — BASIC METABOLIC PANEL
Anion gap: 8 (ref 5–15)
BUN: 13 mg/dL (ref 8–23)
CO2: 28 mmol/L (ref 22–32)
Calcium: 9 mg/dL (ref 8.9–10.3)
Chloride: 103 mmol/L (ref 98–111)
Creatinine, Ser: 0.59 mg/dL — ABNORMAL LOW (ref 0.61–1.24)
GFR, Estimated: >60 mL/min (ref >60)
Glucose, Bld: 101 mg/dL — ABNORMAL HIGH (ref 70–99)
Potassium: 3.8 mmol/L (ref 3.5–5.1)
Sodium: 139 mmol/L (ref 135–145)

## 2023-01-14 LAB — TSH: TSH: 1.753 u[IU]/mL (ref 0.350–4.500)

## 2023-01-14 MED ORDER — GADOBUTROL 1 MMOL/ML IV SOLN
10.0000 mL | Freq: Once | INTRAVENOUS | Status: AC | PRN
  Administered 2023-01-14: 10 mL via INTRAVENOUS

## 2023-01-14 MED ORDER — PERFLUTREN LIPID MICROSPHERE
1.0000 mL | INTRAVENOUS | Status: AC | PRN
  Administered 2023-01-14: 4 mL via INTRAVENOUS

## 2023-01-14 MED ORDER — PANTOPRAZOLE SODIUM 40 MG PO TBEC
40.0000 mg | DELAYED_RELEASE_TABLET | Freq: Every day | ORAL | Status: DC
  Administered 2023-01-14: 40 mg via ORAL
  Filled 2023-01-14: qty 1

## 2023-01-14 MED ORDER — POLYETHYLENE GLYCOL 3350 17 G PO PACK: 17.0000 g | PACK | Freq: Every day | ORAL | Status: DC

## 2023-01-14 MED ORDER — NITROGLYCERIN 0.4 MG SL SUBL
0.4000 mg | SUBLINGUAL_TABLET | SUBLINGUAL | 1 refills | Status: AC | PRN
  Filled 2023-01-14: qty 25, 25d supply, fill #0

## 2023-01-14 MED ORDER — SODIUM CHLORIDE 0.9 % IV SOLN: INTRAVENOUS | Status: DC

## 2023-01-14 MED ORDER — SENNOSIDES-DOCUSATE SODIUM 8.6-50 MG PO TABS: 1.0000 | ORAL_TABLET | Freq: Two times a day (BID) | ORAL | Status: DC

## 2023-01-14 MED ORDER — LOSARTAN POTASSIUM 50 MG PO TABS
75.0000 mg | ORAL_TABLET | Freq: Once | ORAL | Status: AC
  Administered 2023-01-14: 75 mg via ORAL
  Filled 2023-01-14: qty 1

## 2023-01-14 MED ORDER — PANTOPRAZOLE SODIUM 40 MG PO TBEC
40.0000 mg | DELAYED_RELEASE_TABLET | Freq: Every day | ORAL | 0 refills | Status: AC
  Filled 2023-01-14: qty 90, 90d supply, fill #0

## 2023-01-14 MED ORDER — LOSARTAN POTASSIUM 50 MG PO TABS: 100.0000 mg | ORAL_TABLET | Freq: Every day | ORAL | Status: DC

## 2023-01-14 MED ORDER — BISACODYL 10 MG RE SUPP: 10.0000 mg | Freq: Once | RECTAL | Status: DC

## 2023-01-14 NOTE — Telephone Encounter (Addendum)
Hi triage, this patient of Dr. Hazle Coca was seen in the hospital for chest pain and had reassuring workup, needs outpatient cardiac PET stress test. Can you help place the order, get this arranged, and notify patient of instructions of how to proceed day of test? He already has a follow-up appointment on 12/16 with Dr. Allyson Sabal which we will keep. Diagnosis is chest pain/precordial pain.   Also needs referral to pharmD lipid clinic to discuss possible PCSK9 inhibitor for cholesterol control. Thank you!  Shared Decision Making/Informed Consent The risks [chest pain, shortness of breath, cardiac arrhythmias, dizziness, blood pressure fluctuations, myocardial infarction, stroke/transient ischemic attack, nausea, vomiting, allergic reaction, radiation exposure, metallic taste sensation and life-threatening complications (estimated to be 1 in 10,000)], benefits (risk stratification, diagnosing coronary artery disease, treatment guidance) and alternatives of a cardiac PET stress test were discussed in detail with Mr. Merklin and he agrees to proceed.

## 2023-01-14 NOTE — Addendum Note (Signed)
Addended by: Laurann Montana on: 01/14/2023 01:53 PM   Modules accepted: Orders

## 2023-01-14 NOTE — Progress Notes (Signed)
*  PRELIMINARY RESULTS* Echocardiogram 2D Echocardiogram has been performed.  Laddie Aquas 01/14/2023, 11:46 AM

## 2023-01-14 NOTE — Discharge Summary (Signed)
Physician Discharge Summary  Todd Patterson:[id number] DOB: 07/13/1950 DOA: 01/13/2023  PCP: Donita Brooks, MD  Admit date: 01/13/2023 Discharge date: 01/14/2023  Time spent: 60 minutes  Recommendations for Outpatient Follow-up:  Follow-up with Dr. Nanetta Batty cardiology on 03/03/2023 at 8 AM.  Office will call with appointment time to schedule outpatient stress test.  Follow-up with cardiology for lab work on 01/21/2023. Follow-up with Donita Brooks, MD in 2 weeks.   Discharge Diagnoses:  Principal Problem:   Chest pain Active Problems:   Abdominal mass   Essential hypertension   Dyslipidemia   Coronary artery disease   Abnormal CT of the abdomen   Discharge Condition: Stable and improved.  Diet recommendation: Heart healthy  Filed Weights   01/13/23 1022  Weight: 98 kg    History of present illness:  HPI per Dr. Orland Patterson is a 72 y.o. Caucasian male with medical history significant for hypertension, dyslipidemia, prostate cancer and coronary artery disease status post PCI and stent as well as osteoarthritis, who presented to the emergency room with acute onset of midsternal chest pressure graded 6/10 in severity with radiation to his back with associated occasional diaphoresis without nausea or vomiting.  His pain is started Saturday evening.  He denied any cough or wheezing or hemoptysis.  No leg pain or edema or recent travels or surgeries.  No dysuria, oliguria or hematuria or flank pain.  No other bleeding diathesis.   ED Course: When he came to the ER, BP was 180/94 with otherwise normal vital signs.  Labs revealed unremarkable BMP.  CBC showed no leukocytosis of 11.8.  Respiratory panel came back negative. EKG as reviewed by me : EKG showed normal sinus rhythm with a rate of 66 with abnormal R wave progression. Imaging: Two-view chest x-ray showed no acute cardiopulmonary disease. CTA of the chest, abdomen and pelvis revealed the  following: Diffuse calcified atherosclerotic plaque. No dissection or aneurysm formation identified.   No consolidation, pneumothorax or effusion. Some chronic lung changes.   3.9 cm cystic lesion identified in the right lower quadrant caudal to the cecum. This appears separate from the terminal ileum but the appendix is not seen as a separate structure. Possibilities would include an appendiceal lesion such as a mucocele or other cystic mass lesion. Overall recommend further evaluation of the structure.   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 Please correlate with any prior or dedicated workup of the left adrenal gland when clinically appropriate such as MRI or pre and postcontrast washout CT.   Separate hepatic, renal and splenic cysts   Hypervascular right hepatic lobe liver lesion measuring 12 mm. This also has a differential. This also could be assessed on a follow up dynamic MRI with and without contrast. Please correlate with any prior as well.   The patient was given 325 mg p.o. aspirin, 4 mg of IV morphine sulfate and 0.4 mg sublingual nitroglycerin.  He was fairly comfortable during my interview.  He will be admitted to an observation medical telemetry bed for further evaluation and management.  Hospital Course:  #1 chest pain -Patient presented with chest pain described as a chest pressure/heaviness midsternal constant in nature with no radiation ongoing for 3 days with multiple cardiac risk factors of CAD status post PCI, hypertension, hyperlipidemia and improved on morphine and nitroglycerin on presentation to the ED. -EKG done with normal sinus rhythm  Physician Discharge Summary  Todd Patterson:[id number] DOB: 07/13/1950 DOA: 01/13/2023  PCP: Donita Brooks, MD  Admit date: 01/13/2023 Discharge date: 01/14/2023  Time spent: 60 minutes  Recommendations for Outpatient Follow-up:  Follow-up with Dr. Nanetta Batty cardiology on 03/03/2023 at 8 AM.  Office will call with appointment time to schedule outpatient stress test.  Follow-up with cardiology for lab work on 01/21/2023. Follow-up with Donita Brooks, MD in 2 weeks.   Discharge Diagnoses:  Principal Problem:   Chest pain Active Problems:   Abdominal mass   Essential hypertension   Dyslipidemia   Coronary artery disease   Abnormal CT of the abdomen   Discharge Condition: Stable and improved.  Diet recommendation: Heart healthy  Filed Weights   01/13/23 1022  Weight: 98 kg    History of present illness:  HPI per Dr. Orland Patterson is a 72 y.o. Caucasian male with medical history significant for hypertension, dyslipidemia, prostate cancer and coronary artery disease status post PCI and stent as well as osteoarthritis, who presented to the emergency room with acute onset of midsternal chest pressure graded 6/10 in severity with radiation to his back with associated occasional diaphoresis without nausea or vomiting.  His pain is started Saturday evening.  He denied any cough or wheezing or hemoptysis.  No leg pain or edema or recent travels or surgeries.  No dysuria, oliguria or hematuria or flank pain.  No other bleeding diathesis.   ED Course: When he came to the ER, BP was 180/94 with otherwise normal vital signs.  Labs revealed unremarkable BMP.  CBC showed no leukocytosis of 11.8.  Respiratory panel came back negative. EKG as reviewed by me : EKG showed normal sinus rhythm with a rate of 66 with abnormal R wave progression. Imaging: Two-view chest x-ray showed no acute cardiopulmonary disease. CTA of the chest, abdomen and pelvis revealed the  following: Diffuse calcified atherosclerotic plaque. No dissection or aneurysm formation identified.   No consolidation, pneumothorax or effusion. Some chronic lung changes.   3.9 cm cystic lesion identified in the right lower quadrant caudal to the cecum. This appears separate from the terminal ileum but the appendix is not seen as a separate structure. Possibilities would include an appendiceal lesion such as a mucocele or other cystic mass lesion. Overall recommend further evaluation of the structure.   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 Please correlate with any prior or dedicated workup of the left adrenal gland when clinically appropriate such as MRI or pre and postcontrast washout CT.   Separate hepatic, renal and splenic cysts   Hypervascular right hepatic lobe liver lesion measuring 12 mm. This also has a differential. This also could be assessed on a follow up dynamic MRI with and without contrast. Please correlate with any prior as well.   The patient was given 325 mg p.o. aspirin, 4 mg of IV morphine sulfate and 0.4 mg sublingual nitroglycerin.  He was fairly comfortable during my interview.  He will be admitted to an observation medical telemetry bed for further evaluation and management.  Hospital Course:  #1 chest pain -Patient presented with chest pain described as a chest pressure/heaviness midsternal constant in nature with no radiation ongoing for 3 days with multiple cardiac risk factors of CAD status post PCI, hypertension, hyperlipidemia and improved on morphine and nitroglycerin on presentation to the ED. -EKG done with normal sinus rhythm  Physician Discharge Summary  Todd Patterson:[id number] DOB: 07/13/1950 DOA: 01/13/2023  PCP: Donita Brooks, MD  Admit date: 01/13/2023 Discharge date: 01/14/2023  Time spent: 60 minutes  Recommendations for Outpatient Follow-up:  Follow-up with Dr. Nanetta Batty cardiology on 03/03/2023 at 8 AM.  Office will call with appointment time to schedule outpatient stress test.  Follow-up with cardiology for lab work on 01/21/2023. Follow-up with Donita Brooks, MD in 2 weeks.   Discharge Diagnoses:  Principal Problem:   Chest pain Active Problems:   Abdominal mass   Essential hypertension   Dyslipidemia   Coronary artery disease   Abnormal CT of the abdomen   Discharge Condition: Stable and improved.  Diet recommendation: Heart healthy  Filed Weights   01/13/23 1022  Weight: 98 kg    History of present illness:  HPI per Dr. Orland Patterson is a 72 y.o. Caucasian male with medical history significant for hypertension, dyslipidemia, prostate cancer and coronary artery disease status post PCI and stent as well as osteoarthritis, who presented to the emergency room with acute onset of midsternal chest pressure graded 6/10 in severity with radiation to his back with associated occasional diaphoresis without nausea or vomiting.  His pain is started Saturday evening.  He denied any cough or wheezing or hemoptysis.  No leg pain or edema or recent travels or surgeries.  No dysuria, oliguria or hematuria or flank pain.  No other bleeding diathesis.   ED Course: When he came to the ER, BP was 180/94 with otherwise normal vital signs.  Labs revealed unremarkable BMP.  CBC showed no leukocytosis of 11.8.  Respiratory panel came back negative. EKG as reviewed by me : EKG showed normal sinus rhythm with a rate of 66 with abnormal R wave progression. Imaging: Two-view chest x-ray showed no acute cardiopulmonary disease. CTA of the chest, abdomen and pelvis revealed the  following: Diffuse calcified atherosclerotic plaque. No dissection or aneurysm formation identified.   No consolidation, pneumothorax or effusion. Some chronic lung changes.   3.9 cm cystic lesion identified in the right lower quadrant caudal to the cecum. This appears separate from the terminal ileum but the appendix is not seen as a separate structure. Possibilities would include an appendiceal lesion such as a mucocele or other cystic mass lesion. Overall recommend further evaluation of the structure.   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 Please correlate with any prior or dedicated workup of the left adrenal gland when clinically appropriate such as MRI or pre and postcontrast washout CT.   Separate hepatic, renal and splenic cysts   Hypervascular right hepatic lobe liver lesion measuring 12 mm. This also has a differential. This also could be assessed on a follow up dynamic MRI with and without contrast. Please correlate with any prior as well.   The patient was given 325 mg p.o. aspirin, 4 mg of IV morphine sulfate and 0.4 mg sublingual nitroglycerin.  He was fairly comfortable during my interview.  He will be admitted to an observation medical telemetry bed for further evaluation and management.  Hospital Course:  #1 chest pain -Patient presented with chest pain described as a chest pressure/heaviness midsternal constant in nature with no radiation ongoing for 3 days with multiple cardiac risk factors of CAD status post PCI, hypertension, hyperlipidemia and improved on morphine and nitroglycerin on presentation to the ED. -EKG done with normal sinus rhythm  right lobe of the liver, without corresponding signal abnormality on pre gadolinium T1 or T2 weighted images, and with normalization of signal intensity on more delayed phase post gadolinium imaging, compatible with benign perfusion anomalies (Transient Hepatic Intensity Differences (THIDs)). No other suspicious appearing cystic or solid hepatic lesions are noted. No intra or extrahepatic biliary ductal dilatation. Pancreas: No pancreatic mass. No pancreatic ductal dilatation. No pancreatic or peripancreatic fluid collections or inflammatory changes. Spleen: Well-defined T1 hypointense, T2 hyperintense nonenhancing lesion measuring 4.7 cm in the anterior aspect of the spleen has imaging characteristics compatible with a simple cyst, with several smaller similar-appearing lesions adjacent to it (no imaging follow-up recommended). Adrenals/Urinary  Tract: Exophytic 1.3 cm T1 hypointense, T2 hyperintense, nonenhancing lesion noted in the medial aspect of the right kidney, compatible with a simple cyst (no imaging follow-up recommended). Left kidney and right adrenal gland are normal in appearance. In the left adrenal gland there is a lesion of complex signal intensity measuring 2.1 cm in diameter which demonstrates predominantly fatty signal intensity, and has other components which demonstrate diffuse loss of signal intensity on out of phase dual echo imaging, indicative of significant intracellular lipid. This either represents a benign myelolipoma or lipid rich adenoma (no imaging follow-up recommended). No hydroureteronephrosis in the visualized portions of the abdomen. Stomach/Bowel: Visualized portions of the abdomen are unremarkable. Pelvis was not comprehensively imaged on this abdominal examination. Vascular/Lymphatic: Aortic atherosclerosis, without evidence of aneurysm in the visualized abdominal vasculature. No lymphadenopathy noted in the abdomen. Other: No significant volume of ascites noted in the visualized portions of the peritoneal cavity. Musculoskeletal: No aggressive appearing osseous lesions are noted in the visualized portions of the skeleton. IMPRESSION: 1. Lesion of concern in the right lobe of the liver is not confidently identified on today's magnetic resonance examination, although other perfusion anomalies are evident in the adjacent parenchyma. The finding on the recent CT examination was also likely a benign perfusion anomaly. 2. Left adrenal lesion has benign imaging characteristics, either a myelolipoma or lipid rich adenoma. No imaging follow-up is recommended. 3. Aortic atherosclerosis. Electronically Signed   By: Trudie Reed M.D.   On: 01/14/2023 12:16   ECHOCARDIOGRAM COMPLETE  Result Date: 01/14/2023    ECHOCARDIOGRAM REPORT   Patient Name:   Todd Patterson Date of Exam: 01/14/2023 Medical Rec #:  [id number]       Height:       70.0 in Accession #:    [id number]     Weight:       216.0 lb Date of Birth:  05/13/1950      BSA:          2.157 m Patient Age:    72 years       BP:           137/71 mmHg Patient Gender: M              HR:           56 bpm. Exam Location:  Inpatient Procedure: 2D Echo, Cardiac Doppler, Color Doppler and Intracardiac            Opacification Agent Indications:    Chest pain R07.9  History:        Patient has prior history of Echocardiogram examinations, most                 recent 05/04/2010. CAD and Previous Myocardial Infarction, ETOH  Physician Discharge Summary  Todd Patterson:[id number] DOB: 07/13/1950 DOA: 01/13/2023  PCP: Donita Brooks, MD  Admit date: 01/13/2023 Discharge date: 01/14/2023  Time spent: 60 minutes  Recommendations for Outpatient Follow-up:  Follow-up with Dr. Nanetta Batty cardiology on 03/03/2023 at 8 AM.  Office will call with appointment time to schedule outpatient stress test.  Follow-up with cardiology for lab work on 01/21/2023. Follow-up with Donita Brooks, MD in 2 weeks.   Discharge Diagnoses:  Principal Problem:   Chest pain Active Problems:   Abdominal mass   Essential hypertension   Dyslipidemia   Coronary artery disease   Abnormal CT of the abdomen   Discharge Condition: Stable and improved.  Diet recommendation: Heart healthy  Filed Weights   01/13/23 1022  Weight: 98 kg    History of present illness:  HPI per Dr. Orland Patterson is a 72 y.o. Caucasian male with medical history significant for hypertension, dyslipidemia, prostate cancer and coronary artery disease status post PCI and stent as well as osteoarthritis, who presented to the emergency room with acute onset of midsternal chest pressure graded 6/10 in severity with radiation to his back with associated occasional diaphoresis without nausea or vomiting.  His pain is started Saturday evening.  He denied any cough or wheezing or hemoptysis.  No leg pain or edema or recent travels or surgeries.  No dysuria, oliguria or hematuria or flank pain.  No other bleeding diathesis.   ED Course: When he came to the ER, BP was 180/94 with otherwise normal vital signs.  Labs revealed unremarkable BMP.  CBC showed no leukocytosis of 11.8.  Respiratory panel came back negative. EKG as reviewed by me : EKG showed normal sinus rhythm with a rate of 66 with abnormal R wave progression. Imaging: Two-view chest x-ray showed no acute cardiopulmonary disease. CTA of the chest, abdomen and pelvis revealed the  following: Diffuse calcified atherosclerotic plaque. No dissection or aneurysm formation identified.   No consolidation, pneumothorax or effusion. Some chronic lung changes.   3.9 cm cystic lesion identified in the right lower quadrant caudal to the cecum. This appears separate from the terminal ileum but the appendix is not seen as a separate structure. Possibilities would include an appendiceal lesion such as a mucocele or other cystic mass lesion. Overall recommend further evaluation of the structure.   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 Please correlate with any prior or dedicated workup of the left adrenal gland when clinically appropriate such as MRI or pre and postcontrast washout CT.   Separate hepatic, renal and splenic cysts   Hypervascular right hepatic lobe liver lesion measuring 12 mm. This also has a differential. This also could be assessed on a follow up dynamic MRI with and without contrast. Please correlate with any prior as well.   The patient was given 325 mg p.o. aspirin, 4 mg of IV morphine sulfate and 0.4 mg sublingual nitroglycerin.  He was fairly comfortable during my interview.  He will be admitted to an observation medical telemetry bed for further evaluation and management.  Hospital Course:  #1 chest pain -Patient presented with chest pain described as a chest pressure/heaviness midsternal constant in nature with no radiation ongoing for 3 days with multiple cardiac risk factors of CAD status post PCI, hypertension, hyperlipidemia and improved on morphine and nitroglycerin on presentation to the ED. -EKG done with normal sinus rhythm  right lobe of the liver, without corresponding signal abnormality on pre gadolinium T1 or T2 weighted images, and with normalization of signal intensity on more delayed phase post gadolinium imaging, compatible with benign perfusion anomalies (Transient Hepatic Intensity Differences (THIDs)). No other suspicious appearing cystic or solid hepatic lesions are noted. No intra or extrahepatic biliary ductal dilatation. Pancreas: No pancreatic mass. No pancreatic ductal dilatation. No pancreatic or peripancreatic fluid collections or inflammatory changes. Spleen: Well-defined T1 hypointense, T2 hyperintense nonenhancing lesion measuring 4.7 cm in the anterior aspect of the spleen has imaging characteristics compatible with a simple cyst, with several smaller similar-appearing lesions adjacent to it (no imaging follow-up recommended). Adrenals/Urinary  Tract: Exophytic 1.3 cm T1 hypointense, T2 hyperintense, nonenhancing lesion noted in the medial aspect of the right kidney, compatible with a simple cyst (no imaging follow-up recommended). Left kidney and right adrenal gland are normal in appearance. In the left adrenal gland there is a lesion of complex signal intensity measuring 2.1 cm in diameter which demonstrates predominantly fatty signal intensity, and has other components which demonstrate diffuse loss of signal intensity on out of phase dual echo imaging, indicative of significant intracellular lipid. This either represents a benign myelolipoma or lipid rich adenoma (no imaging follow-up recommended). No hydroureteronephrosis in the visualized portions of the abdomen. Stomach/Bowel: Visualized portions of the abdomen are unremarkable. Pelvis was not comprehensively imaged on this abdominal examination. Vascular/Lymphatic: Aortic atherosclerosis, without evidence of aneurysm in the visualized abdominal vasculature. No lymphadenopathy noted in the abdomen. Other: No significant volume of ascites noted in the visualized portions of the peritoneal cavity. Musculoskeletal: No aggressive appearing osseous lesions are noted in the visualized portions of the skeleton. IMPRESSION: 1. Lesion of concern in the right lobe of the liver is not confidently identified on today's magnetic resonance examination, although other perfusion anomalies are evident in the adjacent parenchyma. The finding on the recent CT examination was also likely a benign perfusion anomaly. 2. Left adrenal lesion has benign imaging characteristics, either a myelolipoma or lipid rich adenoma. No imaging follow-up is recommended. 3. Aortic atherosclerosis. Electronically Signed   By: Trudie Reed M.D.   On: 01/14/2023 12:16   ECHOCARDIOGRAM COMPLETE  Result Date: 01/14/2023    ECHOCARDIOGRAM REPORT   Patient Name:   Todd Patterson Date of Exam: 01/14/2023 Medical Rec #:  [id number]       Height:       70.0 in Accession #:    [id number]     Weight:       216.0 lb Date of Birth:  05/13/1950      BSA:          2.157 m Patient Age:    72 years       BP:           137/71 mmHg Patient Gender: M              HR:           56 bpm. Exam Location:  Inpatient Procedure: 2D Echo, Cardiac Doppler, Color Doppler and Intracardiac            Opacification Agent Indications:    Chest pain R07.9  History:        Patient has prior history of Echocardiogram examinations, most                 recent 05/04/2010. CAD and Previous Myocardial Infarction, ETOH  Physician Discharge Summary  Todd Patterson:[id number] DOB: 07/13/1950 DOA: 01/13/2023  PCP: Donita Brooks, MD  Admit date: 01/13/2023 Discharge date: 01/14/2023  Time spent: 60 minutes  Recommendations for Outpatient Follow-up:  Follow-up with Dr. Nanetta Batty cardiology on 03/03/2023 at 8 AM.  Office will call with appointment time to schedule outpatient stress test.  Follow-up with cardiology for lab work on 01/21/2023. Follow-up with Donita Brooks, MD in 2 weeks.   Discharge Diagnoses:  Principal Problem:   Chest pain Active Problems:   Abdominal mass   Essential hypertension   Dyslipidemia   Coronary artery disease   Abnormal CT of the abdomen   Discharge Condition: Stable and improved.  Diet recommendation: Heart healthy  Filed Weights   01/13/23 1022  Weight: 98 kg    History of present illness:  HPI per Dr. Orland Patterson is a 72 y.o. Caucasian male with medical history significant for hypertension, dyslipidemia, prostate cancer and coronary artery disease status post PCI and stent as well as osteoarthritis, who presented to the emergency room with acute onset of midsternal chest pressure graded 6/10 in severity with radiation to his back with associated occasional diaphoresis without nausea or vomiting.  His pain is started Saturday evening.  He denied any cough or wheezing or hemoptysis.  No leg pain or edema or recent travels or surgeries.  No dysuria, oliguria or hematuria or flank pain.  No other bleeding diathesis.   ED Course: When he came to the ER, BP was 180/94 with otherwise normal vital signs.  Labs revealed unremarkable BMP.  CBC showed no leukocytosis of 11.8.  Respiratory panel came back negative. EKG as reviewed by me : EKG showed normal sinus rhythm with a rate of 66 with abnormal R wave progression. Imaging: Two-view chest x-ray showed no acute cardiopulmonary disease. CTA of the chest, abdomen and pelvis revealed the  following: Diffuse calcified atherosclerotic plaque. No dissection or aneurysm formation identified.   No consolidation, pneumothorax or effusion. Some chronic lung changes.   3.9 cm cystic lesion identified in the right lower quadrant caudal to the cecum. This appears separate from the terminal ileum but the appendix is not seen as a separate structure. Possibilities would include an appendiceal lesion such as a mucocele or other cystic mass lesion. Overall recommend further evaluation of the structure.   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 Please correlate with any prior or dedicated workup of the left adrenal gland when clinically appropriate such as MRI or pre and postcontrast washout CT.   Separate hepatic, renal and splenic cysts   Hypervascular right hepatic lobe liver lesion measuring 12 mm. This also has a differential. This also could be assessed on a follow up dynamic MRI with and without contrast. Please correlate with any prior as well.   The patient was given 325 mg p.o. aspirin, 4 mg of IV morphine sulfate and 0.4 mg sublingual nitroglycerin.  He was fairly comfortable during my interview.  He will be admitted to an observation medical telemetry bed for further evaluation and management.  Hospital Course:  #1 chest pain -Patient presented with chest pain described as a chest pressure/heaviness midsternal constant in nature with no radiation ongoing for 3 days with multiple cardiac risk factors of CAD status post PCI, hypertension, hyperlipidemia and improved on morphine and nitroglycerin on presentation to the ED. -EKG done with normal sinus rhythm  right lobe of the liver, without corresponding signal abnormality on pre gadolinium T1 or T2 weighted images, and with normalization of signal intensity on more delayed phase post gadolinium imaging, compatible with benign perfusion anomalies (Transient Hepatic Intensity Differences (THIDs)). No other suspicious appearing cystic or solid hepatic lesions are noted. No intra or extrahepatic biliary ductal dilatation. Pancreas: No pancreatic mass. No pancreatic ductal dilatation. No pancreatic or peripancreatic fluid collections or inflammatory changes. Spleen: Well-defined T1 hypointense, T2 hyperintense nonenhancing lesion measuring 4.7 cm in the anterior aspect of the spleen has imaging characteristics compatible with a simple cyst, with several smaller similar-appearing lesions adjacent to it (no imaging follow-up recommended). Adrenals/Urinary  Tract: Exophytic 1.3 cm T1 hypointense, T2 hyperintense, nonenhancing lesion noted in the medial aspect of the right kidney, compatible with a simple cyst (no imaging follow-up recommended). Left kidney and right adrenal gland are normal in appearance. In the left adrenal gland there is a lesion of complex signal intensity measuring 2.1 cm in diameter which demonstrates predominantly fatty signal intensity, and has other components which demonstrate diffuse loss of signal intensity on out of phase dual echo imaging, indicative of significant intracellular lipid. This either represents a benign myelolipoma or lipid rich adenoma (no imaging follow-up recommended). No hydroureteronephrosis in the visualized portions of the abdomen. Stomach/Bowel: Visualized portions of the abdomen are unremarkable. Pelvis was not comprehensively imaged on this abdominal examination. Vascular/Lymphatic: Aortic atherosclerosis, without evidence of aneurysm in the visualized abdominal vasculature. No lymphadenopathy noted in the abdomen. Other: No significant volume of ascites noted in the visualized portions of the peritoneal cavity. Musculoskeletal: No aggressive appearing osseous lesions are noted in the visualized portions of the skeleton. IMPRESSION: 1. Lesion of concern in the right lobe of the liver is not confidently identified on today's magnetic resonance examination, although other perfusion anomalies are evident in the adjacent parenchyma. The finding on the recent CT examination was also likely a benign perfusion anomaly. 2. Left adrenal lesion has benign imaging characteristics, either a myelolipoma or lipid rich adenoma. No imaging follow-up is recommended. 3. Aortic atherosclerosis. Electronically Signed   By: Trudie Reed M.D.   On: 01/14/2023 12:16   ECHOCARDIOGRAM COMPLETE  Result Date: 01/14/2023    ECHOCARDIOGRAM REPORT   Patient Name:   Todd Patterson Date of Exam: 01/14/2023 Medical Rec #:  [id number]       Height:       70.0 in Accession #:    [id number]     Weight:       216.0 lb Date of Birth:  05/13/1950      BSA:          2.157 m Patient Age:    72 years       BP:           137/71 mmHg Patient Gender: M              HR:           56 bpm. Exam Location:  Inpatient Procedure: 2D Echo, Cardiac Doppler, Color Doppler and Intracardiac            Opacification Agent Indications:    Chest pain R07.9  History:        Patient has prior history of Echocardiogram examinations, most                 recent 05/04/2010. CAD and Previous Myocardial Infarction, ETOH  right lobe of the liver, without corresponding signal abnormality on pre gadolinium T1 or T2 weighted images, and with normalization of signal intensity on more delayed phase post gadolinium imaging, compatible with benign perfusion anomalies (Transient Hepatic Intensity Differences (THIDs)). No other suspicious appearing cystic or solid hepatic lesions are noted. No intra or extrahepatic biliary ductal dilatation. Pancreas: No pancreatic mass. No pancreatic ductal dilatation. No pancreatic or peripancreatic fluid collections or inflammatory changes. Spleen: Well-defined T1 hypointense, T2 hyperintense nonenhancing lesion measuring 4.7 cm in the anterior aspect of the spleen has imaging characteristics compatible with a simple cyst, with several smaller similar-appearing lesions adjacent to it (no imaging follow-up recommended). Adrenals/Urinary  Tract: Exophytic 1.3 cm T1 hypointense, T2 hyperintense, nonenhancing lesion noted in the medial aspect of the right kidney, compatible with a simple cyst (no imaging follow-up recommended). Left kidney and right adrenal gland are normal in appearance. In the left adrenal gland there is a lesion of complex signal intensity measuring 2.1 cm in diameter which demonstrates predominantly fatty signal intensity, and has other components which demonstrate diffuse loss of signal intensity on out of phase dual echo imaging, indicative of significant intracellular lipid. This either represents a benign myelolipoma or lipid rich adenoma (no imaging follow-up recommended). No hydroureteronephrosis in the visualized portions of the abdomen. Stomach/Bowel: Visualized portions of the abdomen are unremarkable. Pelvis was not comprehensively imaged on this abdominal examination. Vascular/Lymphatic: Aortic atherosclerosis, without evidence of aneurysm in the visualized abdominal vasculature. No lymphadenopathy noted in the abdomen. Other: No significant volume of ascites noted in the visualized portions of the peritoneal cavity. Musculoskeletal: No aggressive appearing osseous lesions are noted in the visualized portions of the skeleton. IMPRESSION: 1. Lesion of concern in the right lobe of the liver is not confidently identified on today's magnetic resonance examination, although other perfusion anomalies are evident in the adjacent parenchyma. The finding on the recent CT examination was also likely a benign perfusion anomaly. 2. Left adrenal lesion has benign imaging characteristics, either a myelolipoma or lipid rich adenoma. No imaging follow-up is recommended. 3. Aortic atherosclerosis. Electronically Signed   By: Trudie Reed M.D.   On: 01/14/2023 12:16   ECHOCARDIOGRAM COMPLETE  Result Date: 01/14/2023    ECHOCARDIOGRAM REPORT   Patient Name:   Todd Patterson Date of Exam: 01/14/2023 Medical Rec #:  [id number]       Height:       70.0 in Accession #:    [id number]     Weight:       216.0 lb Date of Birth:  05/13/1950      BSA:          2.157 m Patient Age:    72 years       BP:           137/71 mmHg Patient Gender: M              HR:           56 bpm. Exam Location:  Inpatient Procedure: 2D Echo, Cardiac Doppler, Color Doppler and Intracardiac            Opacification Agent Indications:    Chest pain R07.9  History:        Patient has prior history of Echocardiogram examinations, most                 recent 05/04/2010. CAD and Previous Myocardial Infarction, ETOH

## 2023-01-14 NOTE — Consult Note (Addendum)
Cardiology Consultation   Patient ID: MARCHELLO ROHAL MRN: [id number]; DOB: 1950/05/18  Admit date: 01/13/2023 Date of Consult: 01/14/2023  PCP:  Donita Brooks, MD   [facility] Providers Cardiologist:  Nanetta Batty, MD        Patient Profile:   Todd Patterson is a 72 y.o. 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 Arville Care.  History of Present Illness:   Todd Patterson is originally from [place] but moved to [place] about 7 years ago and follows with Dr. Allyson Sabal. He has history of MI/PCI to LAD in 2010 while vacationing in [place], cath report not available. Last ischemic eval was by outside stress echo in 2012 with of exercise, no evidence of inducible ischemia, slightly submaximal due to HR 133 (THR 136) but reassuring study with normal LV function. He has remained active since that time. He works at [employer] course 1 day a week and also golfs without any recent exertional angina. He reports DOE with higher levels of exertion (I.e. hills) which he reports has been very stable.  He presented with 3 day history of chest discomfort that started Saturday while at rest doing nothing at all. The pain would start in the middle of his chest, feeling like a pressure sensation, associated with some radiation to his back with belching. He recalled that the pressure and belching felt like what he had with his prior MI. Unlike his first MI, he did not take any acid-reducing medicine this time. The pain would come and go intermittently without provocation but was predominantly present most of the time, lasting for hours on end. One night he had drenching  night sweats. He had not had any exertional diaphoresis or specific pattern brought on with exertion. He had not had any nausea, vomiting, Due to persistence of symptoms, he came to ER where labs were generally reassuring, WBC appears to run borderline high, hsTrops neg x2. EKG shows NSR with nonspecific STTW changes similar to prior. CXR NAD. CTA was negative for aortic dissection, did show several incidental findings - chronic lung changes, 3.9cm cystic lesion in the RLQ adjacent to cecum (ddx appendiceal lesion such as a mucocele or other cystic mass lesion), left adrenal mass 15mm not clearly a benign lesion, several hepatic, renal, and splenic cysts, and hypervascular 12mm R hepatic liver lesion. Primary team has ordered MRI abdomen for further evaluation, report pending. He received 325mg  ASA, 4mg  morphine, 0.4mg  SL NTG, and IV fluids. He reports his pain was around a 6 when he came in, was down to a 2 yesterday with the measures enacted, then eventually faded away. He woke up feeling great today - can feel a mild awareness of his chest but reports he walked the halls this morning and felt fine. SBP was high yesterday, peak 191/78, down to 137/71 this AM.  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   neurologist-- dr Terrace Arabia;  09-05-2017 acute onset right 6th nerve palsy with ischemic event,  started on plavix;  06/ 2021 left 6th nerve palsy probable same etiology as the right   Anticoagulant long-term use  plavix---  managed by pcp   Arthritis    Colon polyps    Coronary artery disease    cardiologist--- dr Allyson Sabal; 2010  hx MI s/p cath with DES x1  to LAD per cardiology note done in [place]   Dental bridge present    x2 upper  both permanent   Family history of breast cancer    Glaucoma, both eyes    History of COVID-19 03/21/2020   result in epic   History of myocardial infarction 2010   PCI w/ DES to LAD   Hx of colonic polyps    colonoscopy 2018,  recommended repeat in 1 year due to numbe rof polyps.    Hyperlipidemia    Hypertension    Malignant neoplasm prostate [facility])    urologist-- dr gay;  dx 06/ 2022, gleason 3+3, PSA 8.82   Polyposis coli    Primary hyperparathyroidism (HCC)    S/P drug eluting coronary stent placement 2010   to LAD    Past Surgical History:  Procedure Laterality Date   APPENDECTOMY  1974   age 77   CORONARY ANGIOPLASTY WITH STENT PLACEMENT  2010   DES x1 LAD  (in [place])   CYSTOSCOPY N/A 01/15/2021   Procedure: CYSTOSCOPY FLEXIBLE;  Surgeon: Jannifer Hick, MD;  Location: [facility];  Service: Urology;  Laterality: N/A;  No seeds detected in bladder per Dr. Cardell Peach   PARATHYROIDECTOMY N/A 07/04/2016   Procedure: PARATHYROIDECTOMY;  Surgeon: Avel Peace, MD;  Location: [facility];  Service: General;  Laterality: N/A;   RADIOACTIVE SEED IMPLANT N/A 01/15/2021   Procedure: RADIOACTIVE SEED IMPLANT/BRACHYTHERAPY IMPLANT;  Surgeon: Jannifer Hick, MD;  Location[facility];  Service: Urology;  Laterality: N/A;   SPACE OAR INSTILLATION N/A 01/15/2021   Procedure: SPACE OAR INSTILLATION;  Surgeon: Jannifer Hick, MD;  Location: [facility];  Service: Urology;  Laterality: N/A;   TONSILLECTOMY AND ADENOIDECTOMY  1970   age 31     Home Medications:  Prior to Admission medications   Medication Sig Start Date End Date Taking? Authorizing Provider  atorvastatin (LIPITOR) 80 MG tablet TAKE 1 TABLET BY MOUTH EVERY DAY Patient taking differently: Take 80 mg by mouth daily. 11/15/22  Yes Donita Brooks, MD  carvedilol (COREG) 6.25 MG tablet TAKE 1 TABLET BY MOUTH 2 TIMES DAILY WITH A MEAL. 06/21/22  Yes Donita Brooks, MD  clopidogrel (PLAVIX) 75 MG tablet TAKE 1 TABLET (75 MG TOTAL) BY MOUTH DAILY. PLEASE REQUEST FUTURE REFILLS FROM PCP. Patient taking differently: Take 75 mg by mouth in the morning. 11/22/22  Yes Donita Brooks, MD  ibuprofen (ADVIL) 200 MG tablet  Take 600 mg by mouth every 6 (six) hours as needed for mild pain (pain score 1-3) or headache.   Yes [provider]  latanoprost (XALATAN) 0.005 % ophthalmic solution Place 1 drop into both eyes at bedtime.   Yes [provider]  losartan (COZAAR) 25 MG tablet Take 25 mg by mouth See admin instructions. Take 25 mg by mouth in the morning and evening   Yes [provider]  timolol (BETIMOL) 0.5 % ophthalmic solution Place 1 drop into both eyes in the morning.   Yes [provider]  losartan (COZAAR) 100 MG tablet Take 1 tablet (100 mg total) by mouth daily. Patient not taking: Reported on 01/13/2023 12/26/22   Donita Brooks, MD    Inpatient Medications: Scheduled Meds:  atorvastatin  80 mg  Oral Daily   calcium carbonate  1 tablet Oral BID   carvedilol  6.25 mg Oral BID WC   clopidogrel  75 mg Oral Daily   enoxaparin (LOVENOX) injection  40 mg Subcutaneous Q24H   latanoprost  1 drop Both Eyes QHS   losartan  25 mg Oral BID   timolol  1 drop Both Eyes Daily   Continuous Infusions:  sodium chloride     PRN Meds: acetaminophen **OR** acetaminophen, magnesium hydroxide, nitroGLYCERIN, ondansetron **OR** ondansetron (ZOFRAN) IV, traZODone  Allergies:    Allergies  Allergen Reactions   Lisinopril Cough    Social History:   Social History   Socioeconomic History   Marital status: Married    Spouse name: Not on file   Number of children: 2   Years of education: Not on file   Highest education level: Not on file  Occupational History   Occupation: retired  Tobacco Use   Smoking status: Former    Current packs/day: 0.00    Average packs/day: 2.0 packs/day for 42.0 years (84.0 ttl pk-yrs)    Types: Cigarettes    Start date: 17    Quit date: 2010    Years since quitting: 14.8   Smokeless tobacco: Never  Vaping Use   Vaping status: Never Used  Substance and Sexual Activity   Alcohol use: Yes    Alcohol/week: 14.0 - 21.0 standard  drinks of alcohol    Types: 14 - 21 Cans of beer per week    Comment: 01-09-2021  pt stated 2-3 (12oz) beers daily   Drug use: No   Sexual activity: Yes  Other Topics Concern   Not on file  Social History Narrative   Not on file   Social Determinants of Health   Financial Resource Strain: Low Risk  (10/24/2022)   Overall Financial Resource Strain (CARDIA)    Difficulty of Paying Living Expenses: Not hard at all  Food Insecurity: No Food Insecurity (01/13/2023)   Hunger Vital Sign    Worried About Running Out of Food in the Last Year: Never true    Ran Out of Food in the Last Year: Never true  Transportation Needs: No Transportation Needs (01/13/2023)   PRAPARE - Administrator, Civil Service (Medical): No    Lack of Transportation (Non-Medical): No  Physical Activity: Sufficiently Active (10/24/2022)   Exercise Vital Sign    Days of Exercise per Week: 4 days    Minutes of Exercise per Session: 120 min  Stress: No Stress Concern Present (10/24/2022)   [employer] of Occupational Health - Occupational Stress Questionnaire    Feeling of Stress : Not at all  Social Connections: Moderately Isolated (10/24/2022)   Social Connection and Isolation Panel [NHANES]    Frequency of Communication with Friends and Family: More than three times a week    Frequency of Social Gatherings with Friends and Family: More than three times a week    Attends Religious Services: Never    Database administrator or Organizations: No    Attends Banker Meetings: Never    Marital Status: Married  Catering manager Violence: Not At Risk (01/13/2023)   Humiliation, Afraid, Rape, and Kick questionnaire    Fear of Current or Ex-Partner: No    Emotionally Abused: No    Physically Abused: No    Sexually Abused: No    Family History:   Family History  Problem Relation Age of Onset   Dementia Mother  Heart disease Father    Cancer Sister        d. 30   Breast cancer Sister 45        d. 43   Cancer Niece        NOS, d. 80   Throat cancer Nephew        d. 12     ROS:  Please see the history of present illness.  All other ROS reviewed and negative.     Physical Exam/Data:   Vitals:   01/13/23 1825 01/13/23 2236 01/14/23 0301 01/14/23 0617  BP: (!) 191/78 (!) 160/75 (!) 155/74 137/71  Pulse: (!) 58 63 (!) 59 61  Resp: 20 16  17   Temp: 98.1 F (36.7 C) 98.2 F (36.8 C) 97.6 F (36.4 C) 97.9 F (36.6 C)  TempSrc: Oral Oral Oral Oral  SpO2: 96% 98% 95% 96%  Weight:      Height:        Intake/Output Summary (Last 24 hours) at 01/14/2023 0845 Last data filed at 01/14/2023 0641 Gross per 24 hour  Intake 751.85 ml  Output --  Net 751.85 ml      01/13/2023   10:22 AM 12/26/2022    8:07 AM 10/24/2022   10:02 AM  Last 3 Weights  Weight (lbs) 216 lb 226 lb 219 lb  Weight (kg) 97.977 kg 102.513 kg 99.338 kg     Body mass index is 30.99 kg/m.  General: Well developed, well nourished, in no acute distress. Head: Normocephalic, atraumatic, sclera non-icteric, no xanthomas, nares are without discharge. Neck: Negative for carotid bruits. JVP not elevated. Lungs: Clear bilaterally to auscultation without wheezes, rales, or rhonchi. Breathing is unlabored. Heart: RRR S1 S2 without murmurs, rubs, or gallops.  Abdomen: Soft, non-tender, non-distended with normoactive bowel sounds. No rebound/guarding. Extremities: No clubbing or cyanosis. No edema. Distal pedal pulses are 2+ and equal bilaterally. Neuro: Alert and oriented X 3. Moves all extremities spontaneously. Psych:  Responds to questions appropriately with a normal affect.   EKG:  The EKG was personally reviewed and demonstrates:  NSR 66bpm, nonspecific STTW changes without acute change from prior Telemetry:  Telemetry was personally reviewed and demonstrates:  NSR, nocturnal sinus brady upper 40s  Relevant CV Studies: See scanned report  Laboratory Data:  High Sensitivity Troponin:   Recent Labs   Lab 01/13/23 1040 01/13/23 1246  TROPONINIHS 6 6     Chemistry Recent Labs  Lab 01/13/23 1040 01/14/23 0416  NA 139 139  K 4.0 3.8  CL 103 103  CO2 28 28  GLUCOSE 109* 101*  BUN 14 13  CREATININE 0.67 0.59*  CALCIUM 9.5 9.0  GFRNONAA >60 >60  ANIONGAP 8 8    No results for input(s): "PROT", "ALBUMIN", "AST", "ALT", "ALKPHOS", "BILITOT" in the last 168 hours. Lipids No results for input(s): "CHOL", "TRIG", "HDL", "LABVLDL", "LDLCALC", "CHOLHDL" in the last 168 hours.  Hematology Recent Labs  Lab 01/13/23 1040 01/14/23 0416  WBC 11.8* 10.2  RBC 5.26 4.62  HGB 16.3 14.1  HCT 48.5 43.2  MCV 92.2 93.5  MCH 31.0 30.5  MCHC 33.6 32.6  RDW 12.8 12.8  PLT 261 221   Thyroid No results for input(s): "TSH", "FREET4" in the last 168 hours.  BNPNo results for input(s): "BNP", "PROBNP" in the last 168 hours.  DDimer No results for input(s): "DDIMER" in the last 168 hours.   Radiology/Studies:  CT Angio Chest/Abd/Pel for Dissection W and/or Wo Contrast  Result Date: 01/13/2023  CLINICAL DATA:  Burning chest pain and shortness of breath for 3 days. Previous MI. EXAM: CT ANGIOGRAPHY CHEST, ABDOMEN AND PELVIS TECHNIQUE: Non-contrast CT of the chest was initially obtained. Multidetector CT imaging through the chest, abdomen and pelvis was performed using the standard protocol during bolus administration of intravenous contrast. Multiplanar reconstructed images and MIPs were obtained and reviewed to evaluate the vascular anatomy. RADIATION DOSE REDUCTION: This exam was performed according to the departmental dose-optimization program which includes automated exposure control, adjustment of the mA and/or kV according to patient size and/or use of iterative reconstruction technique. CONTRAST:  OMNIPAQUE IOHEXOL 350 MG/ML SOLN COMPARISON:  Chest x-ray 03/07/2023 earlier. Ultrasound abdomen 10/26/2021 of the aorta FINDINGS: CTA CHEST FINDINGS Cardiovascular: On the noncontrast dataset no  curvilinear high density material along the course of the thoracic aorta. Scattered coronary and aortic calcified atherosclerotic plaque identified there is also some plaque along the origin of the great vessels. Diameter of the ascending aorta at the level of the right pulmonary artery measures 3.8 by 3.8 cm. The descending thoracic aorta same level measures 2.8 by 2.8 cm. Distal aortic arch diameter of 2.6 cm. Diameter of the very proximal ascending aorta just above the aortic root measures a diameter of 3.2 cm. No dissection or aneurysm formation. Heart is nonenlarged. Trace pericardial fluid. Mediastinum/Nodes: Slightly heterogeneous thyroid gland. Normal caliber thoracic esophagus. No specific abnormal lymph node enlargement identified in the axillary regions, hilum or mediastinum. Only a few small less than 1 cm size nodes are seen in the hilum and mediastinum, nonpathologic by size criteria. Lungs/Pleura: No pneumothorax, effusion or consolidation. Few areas of peripheral ground-glass. There are some peripheral areas of interstitial septal thickening. Scarring and fibrotic changes. No consolidation, pneumothorax or effusion. Musculoskeletal: Osteopenia. Scattered degenerative changes. Old left-sided rib fractures. These are healed. Review of the MIP images confirms the above findings. CTA ABDOMEN AND PELVIS FINDINGS VASCULAR Aorta: Scattered vascular calcifications identified. No dissection or aneurysm formation. Celiac: Patent without evidence of aneurysm, dissection, vasculitis or significant stenosis. SMA: Patent without evidence of aneurysm, dissection, vasculitis or significant stenosis. Renals: Scattered calcified plaque. There are 2 bilateral renal arteries. IMA: Patent without evidence of aneurysm, dissection, vasculitis or significant stenosis. Inflow: Scattered calcified plaque along the iliac vessels. Mild areas of stenosis seen along the distal left common iliac. Veins: No obvious venous  abnormality within the limitations of this arterial phase study. Review of the MIP images confirms the above findings. NON-VASCULAR Hepatobiliary: With the limits of the early phase of the arterial bolus, there is smaller areas of asymmetric enhancement in the right hepatic lobe on series 10, image 142 measuring 13 mm. Probable vascular lesion. Separate benign-appearing cyst in segment 4. Gallbladder is nondilated. Pancreas: Unremarkable. No pancreatic ductal dilatation or surrounding inflammatory changes. Spleen: Multiple benign-appearing splenic cysts. Adrenals/Urinary Tract: Right adrenal gland is preserved. 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 There is however a separate smaller focus more caudal which is more indeterminate measuring 15 mm on series 10, image 143. Not clearly a benign lesion. Nonspecific perinephric stranding. No enhancing renal mass or collecting system dilatation. Exophytic Bosniak 1 right-sided renal cyst medial measuring 15 mm with Hounsfield unit of close to 0 on series 10, image 185. The ureters have normal course and caliber down to the bladder. Preserved contours of the urinary bladder. Stomach/Bowel: On this non oral contrast exam, the large bowel has a normal course and caliber. Stomach and small bowel are nondilated.  Just caudal to the base of the cecum but separate from the terminal ileum is a cystic lesion. On coronal series 12, image 78 this measures 3.9 cm. Axial series 10, image 265. Based on appearance and location this could be an appendiceal lesion such as a mucocele or other process. Lymphatic: No specific abnormal lymph node enlargement identified in the abdomen and pelvis. Reproductive: Brachytherapy changes in the location of the prostate. Other: No free air or free fluid. Small bilateral fat containing inguinal hernias, left-greater-than-right. Small fat containing umbilical hernia as well. Musculoskeletal:  Scattered degenerative changes of the spine and pelvis. Osteopenia. Unilateral left-sided pars defect at L5 with trace listhesis. Review of the MIP images confirms the above findings. IMPRESSION: Diffuse calcified atherosclerotic plaque. No dissection or aneurysm formation identified. No consolidation, pneumothorax or effusion. Some chronic lung changes. 3.9 cm cystic lesion identified in the right lower quadrant caudal to the cecum. This appears separate from the terminal ileum but the appendix is not seen as a separate structure. Possibilities would include an appendiceal lesion such as a mucocele or other cystic mass lesion. Overall recommend further evaluation of the structure. 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 Please correlate with any prior or dedicated workup of the left adrenal gland when clinically appropriate such as MRI or pre and postcontrast washout CT. Separate hepatic, renal and splenic cysts Hypervascular right hepatic lobe liver lesion measuring 12 mm. This also has a differential. This also could be assessed on a follow up dynamic MRI with and without contrast. Please correlate with any prior as well. Electronically Signed   By: Karen Kays M.D.   On: 01/13/2023 16:31   DG Chest 2 View  Result Date: 01/13/2023 CLINICAL DATA:  Chest pain. EXAM: CHEST - 2 VIEW COMPARISON:  Chest radiograph dated December 14, 2020. FINDINGS: The heart size and mediastinal contours are within normal limits. Aortic atherosclerosis. Both lungs are clear. No pneumothorax or pleural effusion. Remote healed left posterior seventh rib fracture. No acute osseous abnormality. IMPRESSION: No acute cardiopulmonary findings. Electronically Signed   By: Hart Robinsons M.D.   On: 01/13/2023 11:49     Assessment and Plan:   1. Chest pain with mixed features, known hx of CAD s/p MI/PCI to LAD 2010 - despite  pervasive symptoms lasting predominantly most of the last 3 days, troponins and EKG are reassuring - question whether BP or habitual ETOH contributing to GERD flare, but patient has known hx of CAD as above - 2D echo pending - add baseline TSH given h/o thyroid disease - received 325mg  ASA yesterday, continued on Plavix monotherapy today - see below re: lipids - continue BB - f/u EKG this AM nonacute - will discuss plan with Dr. Cristal Deer - addendum: per preliminary review with MD, given reassuring cardiac workup will allow him to eat today (if OK with IM from standpoint of #3), await echo, and consider outpaient stress test if echo OK. Will also add PPI. At DC would need to ensure he is on one that is Plavix compatible (I.e. not Prilosec or Nexium - preferred agents are Protonix, Prevacid or Dexilant)  2. Essential HTN - suboptimal control was also noted at recent PCP visit at which time it was recommended to increase losartan to 100mg  daily; patient presenting on 25mg  BID dose which was continued here, along with carvedilol 6.25mg  BID - will discuss strategy with MD - unable to  titrate BB further given baseline sinus bradycardia  3. Incidental findings on CTA - pending results of MRA - further per primary team  4. Habitual ETOH use, former tobacco use - question whether contributing to #1 (2 beers nightly)  5. Dyslipidemia - last lipid panel by PCP 12/26/22 with LDL 84, trig 117, continued on atorvastatin 80mg  at that time - consider addition of Zetia or referral to pharmD lipid clinic to achieve goal LDL <70   Risk Assessment/Risk Scores:     TIMI Risk Score for Unstable Angina or Non-ST Elevation MI:   The patient's TIMI risk score is 4, which indicates a 20% risk of all cause mortality, new or recurrent myocardial infarction or need for urgent revascularization in the next 14 days.          For questions or updates, please contact [facility] Please consult  [email or web address] for contact info under    Signed, Laurann Montana, PA-C  01/14/2023 8:45 AM

## 2023-01-14 NOTE — TOC CM/SW Note (Signed)
Transition of Care [facility]) - Inpatient Brief Assessment   Patient Details  Name: Todd Patterson MRN: [id number] Date of Birth: [birth year]  Transition of Care [facility]) CM/SW Contact:    Howell Rucks, RN Phone Number: 01/14/2023, 12:11 PM   Clinical Narrative: Met with pt and spouse at bedside to introduce role of TOC/NCM and review for dc planning.  Pt confirms he has an established PCP and pharmacy, no current home care services or home DME, reports he feels safe returning home with support from his spouse, spouse to provide transportation at discharge. MOON completed. TOC Brief Assessment completed. No TOC needs identified at this time.     Transition of Care Asessment: Insurance and Status: Insurance coverage has been reviewed Patient has primary care physician: Yes Home environment has been reviewed: resides with spouse in private residence Prior level of function:: Independent Prior/Current Home Services: No current home services Social Determinants of Health Reivew: SDOH reviewed no interventions necessary Readmission risk has been reviewed: Yes Transition of care needs: no transition of care needs at this time

## 2023-01-14 NOTE — Progress Notes (Addendum)
2d echo report reviewed with Dr. Cristal Deer. EF 60-65%, mod LVH, G1DD, elevated LVEDP, normal RV, no significant valve disease. Reassuring. Relayed result to patient. Per our discussion: - increase losartan as previously recommended by PCP to 100mg  daily - recheck BMET in 1 week, outlined on AVS - discussed following home BP with patient, can check in with him regarding BP at that time - message sent to [facility] triage team to help arrange cardiac PET stress test and lipid clinic referral to consider [id number] - office will call with instructions, consent outlined in phone note - keep follow-up as scheduled with Dr. Allyson Sabal in 02/2023

## 2023-01-14 NOTE — Telephone Encounter (Signed)
How to Prepare for Your Cardiac PET/CT Stress Test:  1. Please do not take these medications before your test:   Medications that may interfere with the cardiac pharmacological stress agent (ex. nitrates - including erectile dysfunction medications, isosorbide mononitrate- [please start to hold this medication the day before the test], tamulosin or beta-blockers-Carvedilol) the day of the exam. (Erectile dysfunction medication should be held for at least 72 hrs prior to test)  Your remaining medications may be taken with water.  2. Nothing to eat or drink, except water, 3 hours prior to arrival time.   NO caffeine/decaffeinated products, or chocolate 12 hours prior to arrival.  3. NO perfume, cologne or lotion on chest or abdomen area.        4. Total time is 1 to 2 hours; you may want to bring reading material for the waiting time.  5. Please report to Radiology at the [facility] Main Entrance 30 minutes early for your test.  [address] [place], [place] [address]  6. Please report to Radiology at [facility] Main Entrance, medical mall, 30 mins prior to your test.  [address]  [place], [place]  [phone]    In preparation for your appointment, medication and supplies will be purchased.  Appointment availability is limited, so if you need to cancel or reschedule, please call the Radiology Department at [phone] [facility]) OR [phone] [facility])  24 hours in advance to avoid a cancellation fee of $100.00  What to Expect After you Arrive:  Once you arrive and check in for your appointment, you will be taken to a preparation room within the Radiology Department.  A technologist or Nurse will obtain your medical history, verify that you are correctly prepped for the exam, and explain the procedure.  Afterwards,  an IV will be started in your arm and electrodes will be placed on your skin for EKG monitoring during the stress portion  of the exam. Then you will be escorted to the PET/CT scanner.  There, staff will get you positioned on the scanner and obtain a blood pressure and EKG.  During the exam, you will continue to be connected to the EKG and blood pressure machines.  A small, safe amount of a radioactive tracer will be injected in your IV to obtain a series of pictures of your heart along with an injection of a stress agent.    After your Exam:  It is recommended that you eat a meal and drink a caffeinated beverage to counter act any effects of the stress agent.  Drink plenty of fluids for the remainder of the day and urinate frequently for the first couple of hours after the exam.  Your doctor will inform you of your test results within 7-10 business days.  For more information and frequently asked questions, please visit our website : [email or web address]  For questions about your test or how to prepare for your test, please call: Cardiac Imaging Nurse Navigators Office: [phone]

## 2023-01-14 NOTE — Telephone Encounter (Signed)
Spoke to patient advised I placed order for a cardiac pet stress test.Advised after approved by insurance someone from that dept will call with appointment.Lipid clinic appointment scheduled 02/07/23 at 2:00 pm at [facility] office.Pet scan instructions mailed to patient's home.

## 2023-01-14 NOTE — Care Management Obs Status (Signed)
MEDICARE OBSERVATION STATUS NOTIFICATION   Patient Details  Name: Todd Patterson MRN: [id number] Date of Birth: Jun 25, 1950   Medicare Observation Status Notification Given:       Howell Rucks, RN 01/14/2023, 12:09 PM

## 2023-01-15 ENCOUNTER — Telehealth: Payer: Self-pay

## 2023-01-15 NOTE — Transitions of Care (Post Inpatient/ED Visit) (Signed)
01/15/2023  Name: Todd Patterson MRN: [id number] DOB: 1950/08/21  Today's TOC FU Call Status: Today's TOC FU Call Status:: Successful TOC FU Call Completed TOC FU Call Complete Date: 01/15/23 Patient's Name and Date of Birth confirmed.  Transition Care Management Follow-up Telephone Call Date of Discharge: 01/14/23 Discharge Facility: [facility]) Type of Discharge: Inpatient Admission Primary Inpatient Discharge Diagnosis:: chest pain How have you been since you were released from the hospital?: Better Any questions or concerns?: No  Items Reviewed: Did you receive and understand the discharge instructions provided?: Yes Medications obtained,verified, and reconciled?: Yes (Medications Reviewed) Any new allergies since your discharge?: No Dietary orders reviewed?: Yes Do you have support at home?: Yes People in Home: spouse  Medications Reviewed Today: Medications Reviewed Today     Reviewed by Karena Addison, LPN (Licensed Practical Nurse) on 01/15/23 at 1029  Med List Status: <None>   Medication Order Taking? Sig Documenting Provider Last Dose Status Informant  atorvastatin (LIPITOR) 80 MG tablet [id number] No TAKE 1 TABLET BY MOUTH EVERY DAY  Patient taking differently: Take 80 mg by mouth daily.   Donita Brooks, MD 01/13/2023 am Active Self  carvedilol (COREG) 6.25 MG tablet [id number] No TAKE 1 TABLET BY MOUTH 2 TIMES DAILY WITH A MEAL. Donita Brooks, MD 01/13/2023 0800 Active Self  clopidogrel (PLAVIX) 75 MG tablet [id number] No TAKE 1 TABLET (75 MG TOTAL) BY MOUTH DAILY. PLEASE REQUEST FUTURE REFILLS FROM PCP.  Patient taking differently: Take 75 mg by mouth in the morning.   Donita Brooks, MD 01/13/2023 0800 Active Self  ibuprofen (ADVIL) 200 MG tablet [id number] No Take 600 mg by mouth every 6 (six) hours as needed for mild pain (pain score 1-3) or headache. [provider] 01/10/2023 Active Self  latanoprost (XALATAN) 0.005 % ophthalmic  solution [id number] No Place 1 drop into both eyes at bedtime. [provider] 01/12/2023 pm Active Self  losartan (COZAAR) 100 MG tablet [id number] No Take 1 tablet (100 mg total) by mouth daily.  Patient not taking: Reported on 01/13/2023   Donita Brooks, MD Not Taking Active Self  nitroGLYCERIN (NITROSTAT) 0.4 MG SL tablet [id number]  Place 1 tablet (0.4 mg total) under the tongue every 5 (five) minutes as needed for chest pain. Rodolph Bong, MD  Active   pantoprazole (PROTONIX) 40 MG tablet [id number]  Take 1 tablet (40 mg total) by mouth daily. Rodolph Bong, MD  Active   polyethylene glycol [facility]) 17 g packet [id number]  Take 17 g by mouth daily. Rodolph Bong, MD  Active   senna-docusate [facility]) 8.6-50 MG tablet [id number]  Take 1 tablet by mouth 2 (two) times daily. Rodolph Bong, MD  Active   timolol (BETIMOL) 0.5 % ophthalmic solution [id number] No Place 1 drop into both eyes in the morning. [provider] 01/13/2023 am Active Self           Med Note Sharlynn Oliphant Jan 13, 2023  5:51 PM) "In the morning only," per the patient            Home Care and Equipment/Supplies:    Functional Questionnaire:    Follow up appointments reviewed: PCP Follow-up appointment confirmed?: Yes Date of PCP follow-up appointment?: 01/28/23 Follow[facility]up Provider: [facility] Follow-up appointment confirmed?: Yes Date of Specialist follow-up appointment?: 02/07/23 Follow-Up Specialty Provider:: cardio Pharm Do you need transportation to your follow-up appointment?: No Do you understand care options if your  condition(s) worsen?: Yes-patient verbalized understanding    SIGNATURE Karena Addison, LPN [facility] Nurse Health Advisor Direct Dial [phone]

## 2023-01-17 ENCOUNTER — Other Ambulatory Visit: Payer: Self-pay | Admitting: Family Medicine

## 2023-01-17 NOTE — Telephone Encounter (Signed)
Medication discontinued 12/26/22 by PCP. Requested Prescriptions  Pending Prescriptions Disp Refills   losartan (COZAAR) 25 MG tablet [Pharmacy Med Name: LOSARTAN POTASSIUM 25 MG TAB] 180 tablet 1    Sig: TAKE 2 TABLETS BY MOUTH EVERY DAY     Cardiovascular:  Angiotensin Receptor Blockers Failed - 01/17/2023  2:38 PM      Failed - Cr in normal range and within 180 days    Creat  Date Value Ref Range Status  12/26/2022 0.74 0.70 - 1.28 mg/dL Final   Creatinine, Ser  Date Value Ref Range Status  01/14/2023 0.59 (L) 0.61 - 1.24 mg/dL Final         Failed - Last BP in normal range    BP Readings from Last 1 Encounters:  01/14/23 (!) 166/84         Failed - Valid encounter within last 6 months    Recent Outpatient Visits           3 years ago Essential hypertension   [facility] Donita Brooks, MD   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, left   [facility] [name], [name], MD   4 years ago Chronic pain of right knee   [facility] Donita Brooks, MD   4 years ago Acute pain of right knee   [facility] [name], [name], MD   4 years ago Upper airway cough syndrome   [facility] Pickard, [name], MD       Future Appointments             In 1 week Pickard, [name], MD [facility]   In 3 weeks   [facility] at [facility]   In 1 month Allyson Sabal, Delton See, MD [facility] at [facility] in normal range and within 180 days    Potassium  Date Value Ref Range Status  01/14/2023 3.8 3.5 - 5.1 mmol/L Final         Passed - Patient is not pregnant

## 2023-01-21 ENCOUNTER — Other Ambulatory Visit: Payer: Self-pay

## 2023-01-21 DIAGNOSIS — I10 Essential (primary) hypertension: Secondary | ICD-10-CM

## 2023-01-22 LAB — BASIC METABOLIC PANEL
BUN/Creatinine Ratio: 19 (ref 10–24)
BUN: 15 mg/dL (ref 8–27)
CO2: 28 mmol/L (ref 20–29)
Calcium: 9.3 mg/dL (ref 8.6–10.2)
Chloride: 104 mmol/L (ref 96–106)
Creatinine, Ser: 0.79 mg/dL (ref 0.76–1.27)
Glucose: 98 mg/dL (ref 70–99)
Potassium: 5 mmol/L (ref 3.5–5.2)
Sodium: 143 mmol/L (ref 134–144)
eGFR: 94 mL/min/{1.73_m2} (ref >59)

## 2023-01-28 ENCOUNTER — Inpatient Hospital Stay: Payer: Medicare HMO | Admitting: Family Medicine

## 2023-01-30 ENCOUNTER — Ambulatory Visit: Payer: Medicare HMO | Admitting: Family Medicine

## 2023-01-30 ENCOUNTER — Encounter: Payer: Self-pay | Admitting: Family Medicine

## 2023-01-30 VITALS — BP 142/82 | HR 69 | Temp 97.7°F | Ht 70.0 in | Wt 227.0 lb

## 2023-01-30 DIAGNOSIS — K6389 Other specified diseases of intestine: Secondary | ICD-10-CM | POA: Diagnosis not present

## 2023-01-30 DIAGNOSIS — R079 Chest pain, unspecified: Secondary | ICD-10-CM

## 2023-01-30 MED ORDER — HYDROCHLOROTHIAZIDE 25 MG PO TABS: 25.0000 mg | ORAL_TABLET | Freq: Every day | ORAL | 3 refills | Status: DC

## 2023-01-30 MED ORDER — CARVEDILOL 6.25 MG PO TABS: 6.2500 mg | ORAL_TABLET | Freq: Two times a day (BID) | ORAL | 3 refills | Status: DC

## 2023-01-30 NOTE — Progress Notes (Signed)
Subjective:    Patient ID: Todd Patterson, male    DOB: 11/01/1950, 72 y.o.   MRN: [id number]  Recently admitted to the hospital:  Admit date: 01/13/2023 Discharge date: 01/14/2023      Discharge Diagnoses:  Principal Problem:   Chest pain Active Problems:   Abdominal mass   Essential hypertension   Dyslipidemia   Coronary artery disease   Abnormal CT of the abdomen    History of present illness:  HPI per [name] is a 72 y.o. Caucasian male with medical history significant for hypertension, dyslipidemia, prostate cancer and coronary artery disease status post PCI and stent as well as osteoarthritis, who presented to the emergency room with acute onset of midsternal chest pressure graded 6/10 in severity with radiation to his back with associated occasional diaphoresis without nausea or vomiting.  His pain is started Saturday evening.  He denied any cough or wheezing or hemoptysis.  No leg pain or edema or recent travels or surgeries.  No dysuria, oliguria or hematuria or flank pain.  No other bleeding diathesis.   ED Course: When he came to the ER, BP was 180/94 with otherwise normal vital signs.  Labs revealed unremarkable BMP.  CBC showed no leukocytosis of 11.8.  Respiratory panel came back negative. EKG as reviewed by me : EKG showed normal sinus rhythm with a rate of 66 with abnormal R wave progression. Imaging: Two-view chest x-ray showed no acute cardiopulmonary disease. CTA of the chest, abdomen and pelvis revealed the following: Diffuse calcified atherosclerotic plaque. No dissection or aneurysm formation identified.   No consolidation, pneumothorax or effusion. Some chronic lung changes.   3.9 cm cystic lesion identified in the right lower quadrant caudal to the cecum. This appears separate from the terminal ileum but the appendix is not seen as a separate structure. Possibilities would include an appendiceal lesion such as a mucocele or other  cystic mass lesion. Overall recommend further evaluation of the structure.   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 Please correlate with any prior or dedicated workup of the left adrenal gland when clinically appropriate such as MRI or pre and postcontrast washout CT.   Separate hepatic, renal and splenic cysts   Hypervascular right hepatic lobe liver lesion measuring 12 mm. This also has a differential. This also could be assessed on a follow up dynamic MRI with and without contrast. Please correlate with any prior as well.   The patient was given 325 mg p.o. aspirin, 4 mg of IV morphine sulfate and 0.4 mg sublingual nitroglycerin.  He was fairly comfortable during my interview.  He will be admitted to an observation medical telemetry bed for further evaluation and management.   Hospital Course:  #1 chest pain -Patient presented with chest pain described as a chest pressure/heaviness midsternal constant in nature with no radiation ongoing for 3 days with multiple cardiac risk factors of CAD status post PCI, hypertension, hyperlipidemia and improved on morphine and nitroglycerin on presentation to the ED. -EKG done with normal sinus rhythm at a rate of 66 with abnormal R wave progression. -Chest x-ray done with no acute cardiopulmonary disease. -CT angiogram of chest abdomen and pelvis revealed diffuse calcified atherosclerotic plaque, no dissection or aneurysmal formation, no consolidation pneumothorax or effusion some chronic lung changes. -Patient received aspirin, IV morphine, nitroglycerin with improvement with his chest pain. -Cardiac enzymes noted to be  negative. -2D echo obtained with EF of 60 to 65%,NWMA, grade 1 diastolic dysfunction, normal right ventricular systolic function. -Patient maintained on beta-blocker, ARB, Plavix. -Patient seen in consultation by cardiology who  reviewed chart and felt chest pain was somewhat atypical, 2D echo noted to be reassuring and cardiology recommended outpatient cardiac PET scan for further evaluation which will be done in the outpatient setting. -PPI recommended as it was felt could likely be a GI etiology. -Patient was discharged in stable and improved condition on PPI with outpatient follow-up with cardiology.   2.  CAD with prior MI/stent 2010 -Patient maintained on home regimen clopidogrel as well as statin and beta-blocker and ARB. -Imaging done noted diffuse atherosclerosis and cardiology recommended referral to Pharm.D. clinic for PCKS9I. -Outpatient follow-up with cardiology.   3.  Hypertension -Patient noted to be on losartan 25 mg twice daily, noted to have had losartan recently increased to 100 mg daily which patient had not yet started. -Cardiology recommended increase of losartan to 100 mg daily. -Patient maintained on home regimen Coreg. -Outpatient follow-up with cardiology.   4.  Abnormal CT abdomen -CT chest abdomen and pelvis noted a left adrenal mass and right hepatic lobe lesion and cystic appendiceal lesion MRI was recommended. -MRI obtained did not confidently identify lesion noted in the right lobe of the liver on CT scan and other perfusion abnormalities on MRI were felt to likely be a benign perfusion anomaly.  Left adrenal lesion noted on MRI had benign imaging characteristics and was thought either to be a myolipoma or lipid rich adenoma with no imaging follow-up recommended. -Outpatient follow-up with PCP.   5.  Hyperlipidemia -Patient maintained on statin. -Patient noted to have a fasting lipid panel done 12/26/2022 with a total cholesterol 169, HDL of 64, LDL of 84, triglycerides of 117. -Cardiology recommended referral to Pharm.D. clinic for PCKS9I. -Outpatient follow-up.   01/30/23 Patient has not had any additional chest pain since discharge from the hospital.  He is taking pantoprazole.   He is currently on 100 mg of losartan.  He states that his systolic blood pressure is still above 140 the majority of the time.  He is not seeing blood pressures greater than 150 however.  He denies any shortness of breath.  He is getting a stress test scheduled by his cardiologist.  MRI of the abdomen proved that the lesion on his liver and left adrenal glands with benign.  However the imaging was only of the abdomen.  They did not image the pelvis.  Therefore there was no mention of the periappendiceal mass seen on the CT scan.  This has not been evaluated as I can tell. 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   neurologist-- dr Terrace Arabia;  09-05-2017 acute onset right 6th nerve palsy with ischemic event,  started on plavix;  06/ 2021 left 6th nerve palsy probable same etiology as the right   Anticoagulant long-term use    plavix---  managed by pcp   Arthritis    Colon polyps    Coronary artery disease    cardiologist--- dr Allyson Sabal; 2010  hx MI s/p cath with DES x1  to LAD per cardiology note done in [place]   Dental bridge present    x2 upper  both permanent   Family history of breast cancer    Glaucoma, both eyes    History of COVID-19 03/21/2020   result in epic   History of myocardial infarction 2010  PCI w/ DES to LAD   Hx of colonic polyps    colonoscopy 2018, recommended repeat in 1 year due to numbe rof polyps.    Hyperlipidemia    Hypertension    Malignant neoplasm prostate [facility])    urologist-- dr gay;  dx 06/ 2022, gleason 3+3, PSA 8.82   Polyposis coli    Primary hyperparathyroidism (HCC)    S/P drug eluting coronary stent placement 2010   to LAD   Past Surgical History:  Procedure Laterality Date   APPENDECTOMY  1974   age 6   CORONARY ANGIOPLASTY WITH STENT PLACEMENT  2010   DES x1 LAD  (in [place])   CYSTOSCOPY N/A 01/15/2021   Procedure: CYSTOSCOPY FLEXIBLE;  Surgeon: Jannifer Hick, MD;  Location: [facility];   Service: Urology;  Laterality: N/A;  No seeds detected in bladder per Dr. Cardell Peach   PARATHYROIDECTOMY N/A 07/04/2016   Procedure: PARATHYROIDECTOMY;  Surgeon: Avel Peace, MD;  Location: [facility];  Service: General;  Laterality: N/A;   RADIOACTIVE SEED IMPLANT N/A 01/15/2021   Procedure: RADIOACTIVE SEED IMPLANT/BRACHYTHERAPY IMPLANT;  Surgeon: Jannifer Hick, MD;  Location[facility];  Service: Urology;  Laterality: N/A;   SPACE OAR INSTILLATION N/A 01/15/2021   Procedure: SPACE OAR INSTILLATION;  Surgeon: Jannifer Hick, MD;  Location[facility];  Service: Urology;  Laterality: N/A;   TONSILLECTOMY AND ADENOIDECTOMY  1970   age 72   Current Outpatient Medications on File Prior to Visit  Medication Sig Dispense Refill   atorvastatin (LIPITOR) 80 MG tablet TAKE 1 TABLET BY MOUTH EVERY DAY (Patient taking differently: Take 80 mg by mouth daily.) 90 tablet 1   carvedilol (COREG) 6.25 MG tablet TAKE 1 TABLET BY MOUTH 2 TIMES DAILY WITH A MEAL. 180 tablet 1   clopidogrel (PLAVIX) 75 MG tablet TAKE 1 TABLET (75 MG TOTAL) BY MOUTH DAILY. PLEASE REQUEST FUTURE REFILLS FROM PCP. (Patient taking differently: Take 75 mg by mouth in the morning.) 90 tablet 1   ibuprofen (ADVIL) 200 MG tablet Take 600 mg by mouth every 6 (six) hours as needed for mild pain (pain score 1-3) or headache.     latanoprost (XALATAN) 0.005 % ophthalmic solution Place 1 drop into both eyes at bedtime.     losartan (COZAAR) 100 MG tablet Take 1 tablet (100 mg total) by mouth daily. 90 tablet 3   nitroGLYCERIN (NITROSTAT) 0.4 MG SL tablet Place 1 tablet (0.4 mg total) under the tongue every 5 (five) minutes as needed for chest pain. 25 tablet 1   pantoprazole (PROTONIX) 40 MG tablet Take 1 tablet (40 mg total) by mouth daily. 90 tablet 0   polyethylene glycol (MIRALAX) 17 g packet Take 17 g by mouth daily.     senna-docusate (SENOKOT S) 8.6-50 MG tablet Take 1 tablet by mouth 2 (two) times daily.      timolol (BETIMOL) 0.5 % ophthalmic solution Place 1 drop into both eyes in the morning.     No current facility-administered medications on file prior to visit.     Allergies  Allergen Reactions   Lisinopril Cough    Social History   Socioeconomic History   Marital status: Married    Spouse name: Not on file   Number of children: 2   Years of education: Not on file   Highest education level: Not on file  Occupational History   Occupation: retired  Tobacco Use   Smoking status: Former  Current packs/day: 0.00    Average packs/day: 2.0 packs/day for 42.0 years (84.0 ttl pk-yrs)    Types: Cigarettes    Start date: 22    Quit date: 2010    Years since quitting: 14.8   Smokeless tobacco: Never  Vaping Use   Vaping status: Never Used  Substance and Sexual Activity   Alcohol use: Yes    Alcohol/week: 14.0 - 21.0 standard drinks of alcohol    Types: 14 - 21 Cans of beer per week    Comment: 01-09-2021  pt stated 2-3 (12oz) beers daily   Drug use: No   Sexual activity: Yes  Other Topics Concern   Not on file  Social History Narrative   Not on file   Social Determinants of Health   Financial Resource Strain: Low Risk  (10/24/2022)   Overall Financial Resource Strain (CARDIA)    Difficulty of Paying Living Expenses: Not hard at all  Food Insecurity: No Food Insecurity (01/13/2023)   Hunger Vital Sign    Worried About Running Out of Food in the Last Year: Never true    Ran Out of Food in the Last Year: Never true  Transportation Needs: No Transportation Needs (01/13/2023)   PRAPARE - Administrator, Civil Service (Medical): No    Lack of Transportation (Non-Medical): No  Physical Activity: Sufficiently Active (10/24/2022)   Exercise Vital Sign    Days of Exercise per Week: 4 days    Minutes of Exercise per Session: 120 min  Stress: No Stress Concern Present (10/24/2022)   [employer] of Occupational Health - Occupational Stress Questionnaire     Feeling of Stress : Not at all  Social Connections: Moderately Isolated (10/24/2022)   Social Connection and Isolation Panel [NHANES]    Frequency of Communication with Friends and Family: More than three times a week    Frequency of Social Gatherings with Friends and Family: More than three times a week    Attends Religious Services: Never    Database administrator or Organizations: No    Attends Banker Meetings: Never    Marital Status: Married  Catering manager Violence: Not At Risk (01/13/2023)   Humiliation, Afraid, Rape, and Kick questionnaire    Fear of Current or Ex-Partner: No    Emotionally Abused: No    Physically Abused: No    Sexually Abused: No   Family History  Problem Relation Age of Onset   Dementia Mother    Heart disease Father    Cancer Sister        d. 66   Breast cancer Sister 39       d. 76   Cancer Niece        NOS, d. 2   Throat cancer Nephew        d. 59      Review of Systems  All other systems reviewed and are negative.      Objective:   Physical Exam Vitals reviewed.  Constitutional:      General: He is not in acute distress.    Appearance: He is well-developed. He is obese. He is not ill-appearing or diaphoretic.  HENT:     Head: Normocephalic and atraumatic.     Right Ear: External ear normal.     Left Ear: External ear normal.     Nose: Nose normal.     Mouth/Throat:     Pharynx: No oropharyngeal exudate.  Eyes:     General:  No scleral icterus.       Right eye: No discharge.        Left eye: No discharge.     Extraocular Movements:     Right eye: Normal extraocular motion and no nystagmus.     Left eye: Normal extraocular motion and no nystagmus.     Conjunctiva/sclera: Conjunctivae normal.     Pupils: Pupils are equal, round, and reactive to light.  Neck:     Thyroid: No thyromegaly.     Vascular: No carotid bruit or JVD.     Trachea: No tracheal deviation.  Cardiovascular:     Rate and Rhythm: Normal rate  and regular rhythm.     Heart sounds: Normal heart sounds. No murmur heard.    No friction rub. No gallop.  Pulmonary:     Effort: Pulmonary effort is normal. No respiratory distress.     Breath sounds: Normal breath sounds. No stridor. No wheezing or rales.  Chest:     Chest wall: No tenderness.  Abdominal:     General: Bowel sounds are normal. There is no distension.     Palpations: Abdomen is soft. There is no mass.     Tenderness: There is no abdominal tenderness. There is no guarding or rebound.  Musculoskeletal:        General: No tenderness or deformity. Normal range of motion.     Cervical back: Normal range of motion and neck supple. No rigidity.     Right lower leg: Edema present.     Left lower leg: Edema present.  Lymphadenopathy:     Cervical: No cervical adenopathy.  Skin:    General: Skin is warm.     Coloration: Skin is not jaundiced or pale.     Findings: No bruising, erythema, lesion or rash.  Neurological:     Mental Status: He is alert and oriented to person, place, and time.     Cranial Nerves: No cranial nerve deficit.     Sensory: No sensory deficit.     Motor: No tremor, atrophy or abnormal muscle tone.     Coordination: Coordination normal.     Gait: Gait normal.     Deep Tendon Reflexes: Reflexes are normal and symmetric.  Psychiatric:        Behavior: Behavior normal.        Thought Content: Thought content normal.        Judgment: Judgment normal.   Patient has a small umbilical hernia        Assessment & Plan:  Chest pain, unspecified type  Intestinal mass - Plan: MR ENTERO PELVIS W WO CONTRAST Chest pain was atypical.  Pain has resolved.  Cardiology is scheduling the patient for a stress test to complete the workup for this.  Blood pressure remains slightly elevated at home so of asked him to add hydrochlorothiazide 25 mg a day to lower blood pressure in the 120-130 range systolic.  MRI was only of the abdomen in the hospital.  They did not  evaluate the pelvis.  Therefore radiology was not able to comment on the mass near the cecum.  I will schedule the patient for MRI of the pelvis to complete the workup to further evaluate the periappendiceal mass

## 2023-01-31 ENCOUNTER — Other Ambulatory Visit: Payer: Self-pay | Admitting: Family Medicine

## 2023-02-03 NOTE — Telephone Encounter (Signed)
I refused the losartan 25 mg because it was discontinued 12/26/2022.   Dose is now 100 mg.

## 2023-02-04 ENCOUNTER — Ambulatory Visit (HOSPITAL_COMMUNITY)
Admission: RE | Admit: 2023-02-04 | Discharge: 2023-02-04 | Disposition: A | Discharge status: Home / Self Care | Payer: Medicare HMO | Source: Ambulatory Visit | Attending: Family Medicine | Admitting: Family Medicine

## 2023-02-04 ENCOUNTER — Encounter (HOSPITAL_COMMUNITY): Payer: Self-pay

## 2023-02-04 ENCOUNTER — Telehealth: Payer: Self-pay | Admitting: Family Medicine

## 2023-02-04 ENCOUNTER — Other Ambulatory Visit: Payer: Self-pay | Admitting: Family Medicine

## 2023-02-04 DIAGNOSIS — K6389 Other specified diseases of intestine: Secondary | ICD-10-CM

## 2023-02-04 MED ORDER — DIAZEPAM 10 MG PO TABS: 10.0000 mg | ORAL_TABLET | Freq: Once | ORAL | 1 refills | Status: DC | PRN

## 2023-02-04 NOTE — Telephone Encounter (Signed)
Patient came to the office to request medication to relax him for the duration of his MRI; stated he was unable to take it today because he's claustrophobic. Patient also stated that although the MRI is partial, they still tried to slide him all the way into the machine.  Pharmacy confirmed as:  [id number] [place], [place] - [address] [name] [address] Phone: [phone]  Fax: [phone] DEA #: [id number]   Please advise at [phone]

## 2023-02-05 DIAGNOSIS — H401131 Primary open-angle glaucoma, bilateral, mild stage: Secondary | ICD-10-CM | POA: Diagnosis not present

## 2023-02-06 ENCOUNTER — Ambulatory Visit
Admission: RE | Admit: 2023-02-06 | Discharge: 2023-02-06 | Disposition: A | Discharge status: Home / Self Care | Payer: Medicare HMO | Source: Ambulatory Visit | Attending: Physician Assistant | Admitting: Physician Assistant

## 2023-02-06 DIAGNOSIS — R072 Precordial pain: Secondary | ICD-10-CM | POA: Diagnosis not present

## 2023-02-06 DIAGNOSIS — Z955 Presence of coronary angioplasty implant and graft: Secondary | ICD-10-CM | POA: Insufficient documentation

## 2023-02-06 DIAGNOSIS — I2510 Atherosclerotic heart disease of native coronary artery without angina pectoris: Secondary | ICD-10-CM | POA: Diagnosis not present

## 2023-02-06 DIAGNOSIS — I700 Atherosclerosis of aorta: Secondary | ICD-10-CM | POA: Insufficient documentation

## 2023-02-06 LAB — NM PET CT CARDIAC PERFUSION MULTI W/ABSOLUTE BLOODFLOW
MBFR: 1.88
Nuc Rest EF: 52 %
Nuc Stress EF: 59 %
Peak HR: 86 {beats}/min
Rest HR: 72 {beats}/min
Rest MBF: 1.05 ml/g/min
Rest Nuclear Isotope Dose: 25 mCi
SRS: 0
SSS: 0
ST Depression (mm): 0 mm
Stress MBF: 1.97 ml/g/min
Stress Nuclear Isotope Dose: 25 mCi
TID: 1.19

## 2023-02-06 MED ORDER — RUBIDIUM RB82 GENERATOR (RUBYFILL)
25.0000 | PACK | Freq: Once | INTRAVENOUS | Status: AC
  Administered 2023-02-06: 25.04 via INTRAVENOUS

## 2023-02-06 MED ORDER — DEXTROSE 5 % IV SOLN
INTRAVENOUS | Status: AC
  Filled 2023-02-06: qty 50

## 2023-02-06 MED ORDER — RUBIDIUM RB82 GENERATOR (RUBYFILL)
25.0000 | PACK | Freq: Once | INTRAVENOUS | Status: AC
  Administered 2023-02-06: 25 via INTRAVENOUS

## 2023-02-06 MED ORDER — CAFFEINE CITRATE BASE COMPONENT 10 MG/ML IV SOLN
INTRAVENOUS | Status: AC
  Filled 2023-02-06: qty 3

## 2023-02-06 MED ORDER — REGADENOSON 0.4 MG/5ML IV SOLN
INTRAVENOUS | Status: AC
Start: 2023-02-06 — End: ?
  Filled 2023-02-06: qty 5

## 2023-02-06 MED ORDER — REGADENOSON 0.4 MG/5ML IV SOLN
0.4000 mg | Freq: Once | INTRAVENOUS | Status: AC
  Administered 2023-02-06: 0.4 mg via INTRAVENOUS
  Filled 2023-02-06: qty 5

## 2023-02-06 NOTE — Progress Notes (Signed)
Patient presents for a cardiac PET stress test and tolerated procedure without incident. Patient maintained acceptable vital signs throughout the test and was offered caffeine after test.  Patient ambulated out of department with a steady gait.  

## 2023-02-07 ENCOUNTER — Ambulatory Visit
Payer: Medicare HMO | Attending: Cardiovascular Disease | Admitting: Pharmacist Clinician (PhC)/ Clinical Pharmacy Specialist

## 2023-02-07 DIAGNOSIS — E782 Mixed hyperlipidemia: Secondary | ICD-10-CM

## 2023-02-07 MED ORDER — EZETIMIBE 10 MG PO TABS: 10.0000 mg | ORAL_TABLET | Freq: Every day | ORAL | 3 refills | Status: DC

## 2023-02-07 NOTE — Patient Instructions (Signed)
Your Results:             Your most recent labs Goal  Total Cholesterol 169 < 200  Triglycerides 117 < 150  HDL (happy/good cholesterol) 64 > 40  LDL (lousy/bad cholesterol 84 < 55   Medication changes:  Start ezetimibe (Zetia) 10 mg once daily.   Lab orders:  We want to repeat labs after 2-3 months.  We will send you a lab order to remind you once we get closer to that time.     Thank you for choosing [facility]

## 2023-02-07 NOTE — Progress Notes (Unsigned)
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 male patient of Dr Allyson Sabal, in the office today to discuss options for cholesterol management.  Insurance Carrier:  [employer] [id number]  LDL Cholesterol goal:  LDL < 55  Current Medications:  atorvastatin 80 mg qd  Family Hx:   neither parent or brothers -2 - had heart issues;   Social Hx: Tobacco: no quit  15 years ago Alcohol: couple of beers per day     Diet:    mix of home and out , variety of protiens, lots of vegetables; doesn't snack much  Exercise: golf 3 times per week 18 holes, works at course 1 day per week  Accessory Clinical Findings   Lab Results  Component Value Date   CHOL 169 12/26/2022   HDL 64 12/26/2022   LDLCALC 84 12/26/2022   TRIG 117 12/26/2022   CHOLHDL 2.6 12/26/2022    No results found for: "LIPOA"  Lab Results  Component Value Date   ALT 17 12/26/2022   AST 14 12/26/2022   ALKPHOS 55 01/11/2021   BILITOT 0.5 12/26/2022   Lab Results  Component Value Date   CREATININE 0.79 01/21/2023   BUN 15 01/21/2023   NA 143 01/21/2023   K 5.0 01/21/2023   CL 104 01/21/2023   CO2 28 01/21/2023   Lab Results  Component Value Date   HGBA1C 5.5 10/19/2021    Home Medications    Current Outpatient Medications  Medication Sig Dispense Refill   ezetimibe (ZETIA) 10 MG tablet Take 1 tablet (10 mg total) by mouth daily. 90 tablet 3   atorvastatin (LIPITOR) 80 MG tablet TAKE 1 TABLET BY MOUTH EVERY DAY (Patient taking differently: Take 80 mg by mouth daily.) 90 tablet 1   carvedilol  (COREG) 6.25 MG tablet Take 1 tablet (6.25 mg total) by mouth 2 (two) times daily with a meal. 180 tablet 3   clopidogrel (PLAVIX) 75 MG tablet TAKE 1 TABLET (75 MG TOTAL) BY MOUTH DAILY. PLEASE REQUEST FUTURE REFILLS FROM PCP. (Patient taking differently: Take 75 mg by mouth in the morning.) 90 tablet 1   diazepam (VALIUM) 10 MG tablet Take 1 tablet (10 mg total) by mouth once as needed for up to 1 dose for anxiety (1 hou rbefore mri.). 3 tablet 1   hydrochlorothiazide (HYDRODIURIL) 25 MG tablet Take 1 tablet (25 mg total) by mouth daily. 90 tablet 3   ibuprofen (ADVIL) 200 MG tablet Take 600 mg by mouth every 6 (six) hours as needed for mild pain (pain score 1-3) or headache.     latanoprost (XALATAN) 0.005 % ophthalmic solution Place 1 drop into both eyes at bedtime.     losartan (COZAAR) 100 MG tablet Take 1 tablet (100 mg total) by mouth daily. 90 tablet 3   nitroGLYCERIN (NITROSTAT) 0.4 MG SL tablet Place 1 tablet (0.4 mg total) under the tongue every 5 (five) minutes as needed for chest pain. 25 tablet 1   pantoprazole (PROTONIX) 40 MG tablet  Take 1 tablet (40 mg total) by mouth daily. 90 tablet 0   polyethylene glycol (MIRALAX) 17 g packet Take 17 g by mouth daily.     senna-docusate (SENOKOT S) 8.6-50 MG tablet Take 1 tablet by mouth 2 (two) times daily.     timolol (BETIMOL) 0.5 % ophthalmic solution Place 1 drop into both eyes in the morning.     No current facility-administered medications for this visit.     Assessment & Plan    Hyperlipidemia Assessment: Patient with ASCVD not at LDL goal of < 70 Most recent LDL 84 on 12/2022 Has been compliant with high intensity statin: atorvastatin 80 mg qd  Not able to tolerate statins secondary to myalgias Reviewed options for lowering LDL cholesterol, including ezetimibe, PCSK-9 inhibitors, bempedoic acid and inclisiran.  Discussed mechanisms of action, dosing, side effects, potential decreases in LDL cholesterol and costs.  Also reviewed  potential options for patient assistance.  Plan: Patient agreeable to starting ezetimibe 10 mg daily Repeat labs after:  3 months Lipid Liver function    Phillips Hay, PharmD CPP [facility] [address]  [place], [place] [address] [phone]  02/09/2023, 3:27 PM

## 2023-02-09 ENCOUNTER — Encounter: Payer: Self-pay | Admitting: Pharmacist Clinician (PhC)/ Clinical Pharmacy Specialist

## 2023-02-09 NOTE — Assessment & Plan Note (Signed)
Assessment: Patient with ASCVD not at LDL goal of < 70 Most recent LDL 84 on 12/2022 Has been compliant with high intensity statin: atorvastatin 80 mg qd  Not able to tolerate statins secondary to myalgias Reviewed options for lowering LDL cholesterol, including ezetimibe, PCSK-9 inhibitors, bempedoic acid and inclisiran.  Discussed mechanisms of action, dosing, side effects, potential decreases in LDL cholesterol and costs.  Also reviewed potential options for patient assistance.  Plan: Patient agreeable to starting ezetimibe 10 mg daily Repeat labs after:  3 months Lipid Liver function

## 2023-02-12 ENCOUNTER — Ambulatory Visit (HOSPITAL_COMMUNITY)
Admission: RE | Admit: 2023-02-12 | Discharge: 2023-02-12 | Disposition: A | Discharge status: Home / Self Care | Payer: Medicare HMO | Source: Ambulatory Visit | Attending: Family Medicine | Admitting: Family Medicine

## 2023-02-12 DIAGNOSIS — K6389 Other specified diseases of intestine: Secondary | ICD-10-CM | POA: Insufficient documentation

## 2023-02-12 DIAGNOSIS — N3289 Other specified disorders of bladder: Secondary | ICD-10-CM | POA: Diagnosis not present

## 2023-02-12 DIAGNOSIS — K4090 Unilateral inguinal hernia, without obstruction or gangrene, not specified as recurrent: Secondary | ICD-10-CM | POA: Diagnosis not present

## 2023-02-12 DIAGNOSIS — K429 Umbilical hernia without obstruction or gangrene: Secondary | ICD-10-CM | POA: Diagnosis not present

## 2023-02-12 MED ORDER — GADOBUTROL 1 MMOL/ML IV SOLN
10.0000 mL | Freq: Once | INTRAVENOUS | Status: AC | PRN
  Administered 2023-02-12: 10 mL via INTRAVENOUS

## 2023-02-22 DIAGNOSIS — R69 Illness, unspecified: Secondary | ICD-10-CM | POA: Diagnosis not present

## 2023-03-03 ENCOUNTER — Ambulatory Visit: Payer: Medicare HMO | Attending: Cardiovascular Disease | Admitting: Cardiovascular Disease

## 2023-03-03 ENCOUNTER — Encounter: Payer: Self-pay | Admitting: Cardiovascular Disease

## 2023-03-03 VITALS — BP 136/70 | HR 79 | Ht 70.0 in | Wt 231.0 lb

## 2023-03-03 DIAGNOSIS — I2510 Atherosclerotic heart disease of native coronary artery without angina pectoris: Secondary | ICD-10-CM

## 2023-03-03 DIAGNOSIS — E782 Mixed hyperlipidemia: Secondary | ICD-10-CM | POA: Diagnosis not present

## 2023-03-03 DIAGNOSIS — I10 Essential (primary) hypertension: Secondary | ICD-10-CM | POA: Diagnosis not present

## 2023-03-03 NOTE — Assessment & Plan Note (Signed)
History of essential hypertension blood pressure measured today at 136/70.  He is on carvedilol, hydrochlorothiazide and losartan.  He checks his blood pressure at home and is usually within normal range on this medication regimen.

## 2023-03-03 NOTE — Assessment & Plan Note (Signed)
History of CAD status post LAD stenting back in 2010 in [place] with a drug-eluting stent.  He was recently hospitalized for chest pain in October which occurred after a family dispute.  His workup in the hospital is unrevealing including negative troponins, normal 2D echo and unremarkable chest CT.  A subsequent outpatient cardiac PET study performed 02/06/2023 showed no evidence of scar or ischemia.  He has had no recurrent chest pain.

## 2023-03-03 NOTE — Assessment & Plan Note (Signed)
History of hyperlipidemia on high-dose statin therapy not at goal with lipid profile performed 12/26/2022 revealing total cholesterol 169, LDL 84 and HDL 64.  He has recently had Zetia added to his medical regimen by Pharm.D.  This should be followed in 3 months.

## 2023-03-03 NOTE — Patient Instructions (Signed)
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
## 2023-04-21 ENCOUNTER — Other Ambulatory Visit: Payer: Medicare Other

## 2023-04-21 DIAGNOSIS — E782 Mixed hyperlipidemia: Secondary | ICD-10-CM

## 2023-04-21 DIAGNOSIS — I639 Cerebral infarction, unspecified: Secondary | ICD-10-CM

## 2023-04-21 DIAGNOSIS — I2510 Atherosclerotic heart disease of native coronary artery without angina pectoris: Secondary | ICD-10-CM

## 2023-04-21 DIAGNOSIS — I10 Essential (primary) hypertension: Secondary | ICD-10-CM

## 2023-04-22 DIAGNOSIS — C61 Malignant neoplasm of prostate: Secondary | ICD-10-CM | POA: Diagnosis not present

## 2023-04-22 LAB — CBC WITH DIFFERENTIAL/PLATELET
Absolute Lymphocytes: 2825 {cells}/uL (ref 850–3900)
Absolute Monocytes: 1356 {cells}/uL — ABNORMAL HIGH (ref 200–950)
Basophils Absolute: 79 {cells}/uL (ref 0–200)
Basophils Relative: 0.7 %
Eosinophils Absolute: 328 {cells}/uL (ref 15–500)
Eosinophils Relative: 2.9 %
HCT: 46.3 % (ref 38.5–50.0)
Hemoglobin: 15.6 g/dL (ref 13.2–17.1)
MCH: 30 pg (ref 27.0–33.0)
MCHC: 33.7 g/dL (ref 32.0–36.0)
MCV: 89 fL (ref 80.0–100.0)
MPV: 10.4 fL (ref 7.5–12.5)
Monocytes Relative: 12 %
Neutro Abs: 6712 {cells}/uL (ref 1500–7800)
Neutrophils Relative %: 59.4 %
Platelets: 265 10*3/uL (ref 140–400)
RBC: 5.2 10*6/uL (ref 4.20–5.80)
RDW: 12.4 % (ref 11.0–15.0)
Total Lymphocyte: 25 %
WBC: 11.3 10*3/uL — ABNORMAL HIGH (ref 3.8–10.8)

## 2023-04-22 LAB — COMPLETE METABOLIC PANEL WITH GFR
AG Ratio: 1.8 (calc) (ref 1.0–2.5)
ALT: 23 U/L (ref 9–46)
AST: 16 U/L (ref 10–35)
Albumin: 4.2 g/dL (ref 3.6–5.1)
Alkaline phosphatase (APISO): 51 U/L (ref 35–144)
BUN: 16 mg/dL (ref 7–25)
CO2: 33 mmol/L — ABNORMAL HIGH (ref 20–32)
Calcium: 9.6 mg/dL (ref 8.6–10.3)
Chloride: 97 mmol/L — ABNORMAL LOW (ref 98–110)
Creat: 0.84 mg/dL (ref 0.70–1.28)
Globulin: 2.4 g/dL (ref 1.9–3.7)
Glucose, Bld: 87 mg/dL (ref 65–99)
Potassium: 3.8 mmol/L (ref 3.5–5.3)
Sodium: 140 mmol/L (ref 135–146)
Total Bilirubin: 0.7 mg/dL (ref 0.2–1.2)
Total Protein: 6.6 g/dL (ref 6.1–8.1)
eGFR: 93 mL/min/{1.73_m2} (ref >=60)

## 2023-04-22 LAB — LIPID PANEL
Cholesterol: 146 mg/dL (ref <200)
HDL: 58 mg/dL (ref >=40)
LDL Cholesterol (Calc): 66 mg/dL
Non-HDL Cholesterol (Calc): 88 mg/dL (ref <130)
Total CHOL/HDL Ratio: 2.5 (calc) (ref <5.0)
Triglycerides: 136 mg/dL (ref <150)

## 2023-04-29 ENCOUNTER — Ambulatory Visit (INDEPENDENT_AMBULATORY_CARE_PROVIDER_SITE_OTHER): Payer: Medicare Other | Admitting: Family Medicine

## 2023-04-29 ENCOUNTER — Encounter: Payer: Self-pay | Admitting: Family Medicine

## 2023-04-29 ENCOUNTER — Encounter: Payer: Self-pay | Admitting: Nurse Practitioner

## 2023-04-29 ENCOUNTER — Ambulatory Visit: Payer: Medicare Other | Admitting: Nurse Practitioner

## 2023-04-29 ENCOUNTER — Telehealth: Payer: Self-pay

## 2023-04-29 VITALS — BP 120/70 | HR 68 | Ht 68.0 in | Wt 222.1 lb

## 2023-04-29 VITALS — BP 126/76 | HR 63 | Temp 98.1°F | Ht 68.0 in | Wt 221.2 lb

## 2023-04-29 DIAGNOSIS — Z0001 Encounter for general adult medical examination with abnormal findings: Secondary | ICD-10-CM | POA: Diagnosis not present

## 2023-04-29 DIAGNOSIS — E7800 Pure hypercholesterolemia, unspecified: Secondary | ICD-10-CM

## 2023-04-29 DIAGNOSIS — Z860100 Personal history of colon polyps, unspecified: Secondary | ICD-10-CM

## 2023-04-29 DIAGNOSIS — Z87891 Personal history of nicotine dependence: Secondary | ICD-10-CM

## 2023-04-29 DIAGNOSIS — I2510 Atherosclerotic heart disease of native coronary artery without angina pectoris: Secondary | ICD-10-CM | POA: Diagnosis not present

## 2023-04-29 DIAGNOSIS — N401 Enlarged prostate with lower urinary tract symptoms: Secondary | ICD-10-CM | POA: Diagnosis not present

## 2023-04-29 DIAGNOSIS — R350 Frequency of micturition: Secondary | ICD-10-CM | POA: Diagnosis not present

## 2023-04-29 DIAGNOSIS — C61 Malignant neoplasm of prostate: Secondary | ICD-10-CM | POA: Diagnosis not present

## 2023-04-29 DIAGNOSIS — Z860101 Personal history of adenomatous and serrated colon polyps: Secondary | ICD-10-CM | POA: Diagnosis not present

## 2023-04-29 DIAGNOSIS — Z23 Encounter for immunization: Secondary | ICD-10-CM

## 2023-04-29 DIAGNOSIS — Z0000 Encounter for general adult medical examination without abnormal findings: Secondary | ICD-10-CM

## 2023-04-29 MED ORDER — SUFLAVE 178.7 G PO SOLR: 1.0000 | Freq: Once | ORAL | 0 refills | Status: AC

## 2023-04-29 NOTE — Telephone Encounter (Signed)
[facility] Pre-operative Risk Assessment     Request for surgical clearance:     Endoscopy Procedure  What type of surgery is being performed?     Colonoscopy  When is this surgery scheduled?     06/06/23  What type of clearance is required ?   Pharmacy  Are there any medications that need to be held prior to surgery and how long? Plavix and 5 days  Practice name and name of physician performing surgery?      [facility]  What is your office phone and fax number?      Phone- [phone]  Fax- [phone]  Anesthesia type (None, local, MAC, general) ?       MAC   Please route your response to Advit Trethewey, CMA

## 2023-04-29 NOTE — Telephone Encounter (Signed)
Contacted patient and patient is aware to stop Plavix 5 days prior, on 3/16 per PCP.

## 2023-04-29 NOTE — Patient Instructions (Addendum)
You have been scheduled for a colonoscopy. Please follow written instructions given to you at your visit today.   If you use inhalers (even only as needed), please bring them with you on the day of your procedure. ___________________________________________________________________________  Bonita Quin will receive your bowel preparation through [facility], which ensures the lowest copay and home delivery, with outreach via text or call from an [phone] number. Please respond promptly to avoid rescheduling of your procedure. If you are interested in alternative options or have any questions regarding your prep, please contact them at [phone] ____________________________________________________________________________  Your Provider Has Sent Your Bowel Prep Regimen To [facility]   [facility] will contact you to verify your information and collect your copay, if applicable. Enjoy the comfort of your home while your prescription is mailed to you, FREE of any shipping charges.   [facility] accepts all major insurance benefits and applies discounts & coupons.  Have additional questions?   Chat: [email or web address] Call: [phone] Email: [email or web address] NCPDP: [id number]  How will [facility] contact you?  With a Welcome phone call,  a Welcome text and a checkout link in text form.  Texts you receive from [phone] Are NOT Spam.  *To set up delivery, you must complete the checkout process via link or speak to one of the patient care representatives. If [facility] is unable to reach you, your prescription may be delayed.  To avoid long hold times on the phone, you may also utilize the secure chat feature on the [facility] website to request that they call you back for transaction completion or to expedite your concerns.  Due to recent changes in healthcare laws, you may see the results of your imaging and laboratory studies on MyChart before your provider has had a chance to review  them.  We understand that in some cases there may be results that are confusing or concerning to you. Not all laboratory results come back in the same time frame and the provider may be waiting for multiple results in order to interpret others.  Please give [place] 48 hours in order for your provider to thoroughly review all the results before contacting the office for clarification of your results.   Thank you for trusting me with your gastrointestinal care!   Alcide Evener, CRNP

## 2023-04-29 NOTE — Progress Notes (Signed)
Subjective:    Patient ID: Todd Patterson, male    DOB: Oct 10, 1950, 73 y.o.   MRN: [id number]   Patient is a very pleasant 73 year old white male whose past medical history is significant for myocardial infarction in 2008. Patient underwent PTCA with stenting.  Patient states that he had a bare-metal stent. He was continued on aspirin and Plavix for 2 years and then was instructed to continue aspirin thereafter. He also has a history of hyperlipidemia and hypertension.  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  After these incidents, he was switched to plavix instead of aspirin.  Patient recently saw his urologist and his PSA was 0.8.  He is scheduled a colonoscopy for next month.  He does have a remote history of smoking however he quit smoking more than 16 years ago and therefore does not require lung cancer screening.  His most recent lab work is listed below Lab on 04/21/2023  Component Date Value Ref Range Status   WBC 04/21/2023 11.3 (H)  3.8 - 10.8 Thousand/uL Final   RBC 04/21/2023 5.20  4.20 - 5.80 Million/uL Final   Hemoglobin 04/21/2023 15.6  13.2 - 17.1 g/dL Final   HCT 40/98/1191 46.3  38.5 - 50.0 % Final   MCV 04/21/2023 89.0  80.0 - 100.0 fL Final   MCH 04/21/2023 30.0  27.0 - 33.0 pg Final   MCHC 04/21/2023 33.7  32.0 - 36.0 g/dL Final   Comment: For adults, a slight decrease in the calculated MCHC value (in the range of 30 to 32 g/dL) is most likely not clinically significant; however, it should be interpreted with caution in correlation with other red cell parameters and the patient's clinical condition.    RDW 04/21/2023 12.4  11.0 - 15.0 % Final   Platelets 04/21/2023 265  140 - 400 Thousand/uL Final   MPV 04/21/2023 10.4  7.5 - 12.5 fL Final   Neutro Abs 04/21/2023 6,712  1,500 - 7,800 cells/uL Final   Absolute Lymphocytes 04/21/2023 2,825  850 - 3,900 cells/uL Final   Absolute Monocytes 04/21/2023 1,356 (H)  200 - 950 cells/uL Final    Eosinophils Absolute 04/21/2023 328  15 - 500 cells/uL Final   Basophils Absolute 04/21/2023 79  0 - 200 cells/uL Final   Neutrophils Relative % 04/21/2023 59.4  % Final   Total Lymphocyte 04/21/2023 25.0  % Final   Monocytes Relative 04/21/2023 12.0  % Final   Eosinophils Relative 04/21/2023 2.9  % Final   Basophils Relative 04/21/2023 0.7  % Final   Glucose, Bld 04/21/2023 87  65 - 99 mg/dL Final   Comment: .            Fasting reference interval .    BUN 04/21/2023 16  7 - 25 mg/dL Final   Creat 47/82/9562 0.84  0.70 - 1.28 mg/dL Final   eGFR 13/10/6576 93  > OR = 60 mL/min/1.75m2 Final   BUN/Creatinine Ratio 04/21/2023 SEE NOTE:  6 - 22 (calc) Final   Comment:    Not Reported: BUN and Creatinine are within    reference range. .    Sodium 04/21/2023 140  135 - 146 mmol/L Final   Potassium 04/21/2023 3.8  3.5 - 5.3 mmol/L Final   Chloride 04/21/2023 97 (L)  98 - 110 mmol/L Final   CO2 04/21/2023 33 (H)  20 - 32 mmol/L Final   Calcium 04/21/2023 9.6  8.6 - 10.3 mg/dL Final  Total Protein 04/21/2023 6.6  6.1 - 8.1 g/dL Final   Albumin 16/12/9602 4.2  3.6 - 5.1 g/dL Final   Globulin 54/11/8117 2.4  1.9 - 3.7 g/dL (calc) Final   AG Ratio 04/21/2023 1.8  1.0 - 2.5 (calc) Final   Total Bilirubin 04/21/2023 0.7  0.2 - 1.2 mg/dL Final   Alkaline phosphatase (APISO) 04/21/2023 51  35 - 144 U/L Final   AST 04/21/2023 16  10 - 35 U/L Final   ALT 04/21/2023 23  9 - 46 U/L Final   Cholesterol 04/21/2023 146  <200 mg/dL Final   HDL 14/78/2956 58  > OR = 40 mg/dL Final   Triglycerides 21/30/8657 136  <150 mg/dL Final   LDL Cholesterol (Calc) 04/21/2023 66  mg/dL (calc) Final   Comment: Reference range: <100 . Desirable range <100 mg/dL for primary prevention;   <70 mg/dL for patients with CHD or diabetic patients  with > or = 2 CHD risk factors. Marland Kitchen LDL-C is now calculated using the Martin-Hopkins  calculation, which is a validated novel method providing  better accuracy than the  Friedewald equation in the  estimation of LDL-C.  Horald Pollen et al. Lenox Ahr. 8469;629(52): 2061-2068  (http://education.[facility].com/faq/FAQ164)    Total CHOL/HDL Ratio 04/21/2023 2.5  <8.4 (calc) Final   Non-HDL Cholesterol (Calc) 04/21/2023 88  <130 mg/dL (calc) Final   Comment: For patients with diabetes plus 1 major ASCVD risk  factor, treating to a non-HDL-C goal of <100 mg/dL  (LDL-C of <13 mg/dL) is considered a therapeutic  option.     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   neurologist-- dr Terrace Arabia;  09-05-2017 acute onset right 6th nerve palsy with ischemic event,  started on plavix;  06/ 2021 left 6th nerve palsy probable same etiology as the right   Anticoagulant long-term use    plavix---  managed by pcp   Arthritis    Colon polyps    Coronary artery disease    cardiologist--- dr Allyson Sabal; 2010  hx MI s/p cath with DES x1  to LAD per cardiology note done in [place]   Dental bridge present    x2 upper  both permanent   Family history of breast cancer    Glaucoma, both eyes    History of COVID-19 03/21/2020   result in epic   History of myocardial infarction 2010   PCI w/ DES to LAD   Hx of colonic polyps    colonoscopy 2018, recommended repeat in 1 year due to numbe rof polyps.    Hyperlipidemia    Hypertension    Malignant neoplasm prostate [facility])    urologist-- dr gay;  dx 06/ 2022, gleason 3+3, PSA 8.82   Polyposis coli    Primary hyperparathyroidism (HCC)    S/P drug eluting coronary stent placement 2010   to LAD   Past Surgical History:  Procedure Laterality Date   APPENDECTOMY  1974   age 38   CORONARY ANGIOPLASTY WITH STENT PLACEMENT  2010   DES x1 LAD  (in [place])   CYSTOSCOPY N/A 01/15/2021   Procedure: CYSTOSCOPY FLEXIBLE;  Surgeon: Jannifer Hick, MD;  Location: [facility];  Service: Urology;  Laterality: N/A;  No seeds detected in bladder per Dr. Cardell Peach   PARATHYROIDECTOMY N/A 07/04/2016    Procedure: PARATHYROIDECTOMY;  Surgeon: Avel Peace, MD;  Location: [facility];  Service: General;  Laterality: N/A;   RADIOACTIVE SEED IMPLANT N/A 01/15/2021   Procedure: RADIOACTIVE  SEED IMPLANT/BRACHYTHERAPY IMPLANT;  Surgeon: Jannifer Hick, MD;  Location: [facility];  Service: Urology;  Laterality: N/A;   SPACE OAR INSTILLATION N/A 01/15/2021   Procedure: SPACE OAR INSTILLATION;  Surgeon: Jannifer Hick, MD;  Location[facility];  Service: Urology;  Laterality: N/A;   TONSILLECTOMY AND ADENOIDECTOMY  1970   age 44   Current Outpatient Medications on File Prior to Visit  Medication Sig Dispense Refill   atorvastatin (LIPITOR) 80 MG tablet TAKE 1 TABLET BY MOUTH EVERY DAY (Patient taking differently: Take 80 mg by mouth daily.) 90 tablet 1   carvedilol (COREG) 6.25 MG tablet Take 1 tablet (6.25 mg total) by mouth 2 (two) times daily with a meal. 180 tablet 3   clopidogrel (PLAVIX) 75 MG tablet TAKE 1 TABLET (75 MG TOTAL) BY MOUTH DAILY. PLEASE REQUEST FUTURE REFILLS FROM PCP. (Patient taking differently: Take 75 mg by mouth in the morning.) 90 tablet 1   ezetimibe (ZETIA) 10 MG tablet Take 1 tablet (10 mg total) by mouth daily. 90 tablet 3   hydrochlorothiazide (HYDRODIURIL) 25 MG tablet Take 1 tablet (25 mg total) by mouth daily. 90 tablet 3   ibuprofen (ADVIL) 200 MG tablet Take 600 mg by mouth every 6 (six) hours as needed for mild pain (pain score 1-3) or headache.     latanoprost (XALATAN) 0.005 % ophthalmic solution Place 1 drop into both eyes at bedtime.     losartan (COZAAR) 100 MG tablet Take 1 tablet (100 mg total) by mouth daily. 90 tablet 3   nitroGLYCERIN (NITROSTAT) 0.4 MG SL tablet Place 1 tablet (0.4 mg total) under the tongue every 5 (five) minutes as needed for chest pain. 25 tablet 1   pantoprazole (PROTONIX) 40 MG tablet Take 1 tablet (40 mg total) by mouth daily. 90 tablet 0   SUFLAVE 178.7 g SOLR Take 1 kit by mouth once for 1 dose. 1 each  0   tamsulosin (FLOMAX) 0.4 MG CAPS capsule Take 0.4 mg by mouth at bedtime.     timolol (BETIMOL) 0.5 % ophthalmic solution Place 1 drop into both eyes in the morning.     No current facility-administered medications on file prior to visit.   Allergies  Allergen Reactions   Lisinopril Cough   Social History   Socioeconomic History   Marital status: Married    Spouse name: Not on file   Number of children: 2   Years of education: Not on file   Highest education level: Not on file  Occupational History   Occupation: retired  Tobacco Use   Smoking status: Former    Current packs/day: 0.00    Average packs/day: 2.0 packs/day for 42.0 years (84.0 ttl pk-yrs)    Types: Cigarettes    Start date: 13    Quit date: 2010    Years since quitting: 15.1   Smokeless tobacco: Never  Vaping Use   Vaping status: Never Used  Substance and Sexual Activity   Alcohol use: Yes    Alcohol/week: 14.0 - 21.0 standard drinks of alcohol    Types: 14 - 21 Cans of beer per week    Comment: 01-09-2021  pt stated 2-3 (12oz) beers daily   Drug use: No   Sexual activity: Yes  Other Topics Concern   Not on file  Social History Narrative   Not on file   Social Drivers of Health   Financial Resource Strain: Low Risk  (10/24/2022)   Overall Financial Resource  Strain (CARDIA)    Difficulty of Paying Living Expenses: Not hard at all  Food Insecurity: No Food Insecurity (01/13/2023)   Hunger Vital Sign    Worried About Running Out of Food in the Last Year: Never true    Ran Out of Food in the Last Year: Never true  Transportation Needs: No Transportation Needs (01/13/2023)   PRAPARE - Administrator, Civil Service (Medical): No    Lack of Transportation (Non-Medical): No  Physical Activity: Sufficiently Active (10/24/2022)   Exercise Vital Sign    Days of Exercise per Week: 4 days    Minutes of Exercise per Session: 120 min  Stress: No Stress Concern Present (10/24/2022)   [employer] of Occupational Health - Occupational Stress Questionnaire    Feeling of Stress : Not at all  Social Connections: Moderately Isolated (10/24/2022)   Social Connection and Isolation Panel [NHANES]    Frequency of Communication with Friends and Family: More than three times a week    Frequency of Social Gatherings with Friends and Family: More than three times a week    Attends Religious Services: Never    Database administrator or Organizations: No    Attends Banker Meetings: Never    Marital Status: Married  Catering manager Violence: Not At Risk (01/13/2023)   Humiliation, Afraid, Rape, and Kick questionnaire    Fear of Current or Ex-Partner: No    Emotionally Abused: No    Physically Abused: No    Sexually Abused: No   Family History  Problem Relation Age of Onset   Dementia Mother    Heart disease Father    Cancer Sister        d. 71   Breast cancer Sister 38       d. 48   Cancer Niece        NOS, d. 49   Throat cancer Nephew        d. 3      Review of Systems  Neurological:  Positive for headaches.  All other systems reviewed and are negative.      Objective:   Physical Exam Vitals reviewed.  Constitutional:      General: He is not in acute distress.    Appearance: He is well-developed. He is obese. He is not ill-appearing or diaphoretic.  HENT:     Head: Normocephalic and atraumatic.     Right Ear: External ear normal.     Left Ear: External ear normal.     Nose: Nose normal.     Mouth/Throat:     Pharynx: No oropharyngeal exudate.  Eyes:     General: No scleral icterus.       Right eye: No discharge.        Left eye: No discharge.     Extraocular Movements:     Right eye: Normal extraocular motion and no nystagmus.     Left eye: Normal extraocular motion and no nystagmus.     Conjunctiva/sclera: Conjunctivae normal.     Pupils: Pupils are equal, round, and reactive to light.  Neck:     Thyroid: No thyromegaly.     Vascular:  No carotid bruit or JVD.     Trachea: No tracheal deviation.  Cardiovascular:     Rate and Rhythm: Normal rate and regular rhythm.     Heart sounds: Normal heart sounds. No murmur heard.    No friction rub. No gallop.  Pulmonary:  Effort: Pulmonary effort is normal. No respiratory distress.     Breath sounds: Normal breath sounds. No stridor. No wheezing, rhonchi or rales.  Chest:     Chest wall: No tenderness.  Abdominal:     General: Bowel sounds are normal. There is no distension.     Palpations: Abdomen is soft. There is no mass.     Tenderness: There is no abdominal tenderness. There is no guarding or rebound.  Musculoskeletal:        General: No tenderness or deformity. Normal range of motion.     Cervical back: Normal range of motion and neck supple. No rigidity.     Right lower leg: No edema.     Left lower leg: No edema.  Lymphadenopathy:     Cervical: No cervical adenopathy.  Skin:    General: Skin is warm.     Coloration: Skin is not jaundiced or pale.     Findings: No bruising, erythema, lesion or rash.  Neurological:     Mental Status: He is alert and oriented to person, place, and time.     Cranial Nerves: No cranial nerve deficit.     Sensory: No sensory deficit.     Motor: No weakness, tremor, atrophy or abnormal muscle tone.     Coordination: Coordination normal.     Gait: Gait normal.     Deep Tendon Reflexes: Reflexes are normal and symmetric. Reflexes normal.  Psychiatric:        Behavior: Behavior normal.        Thought Content: Thought content normal.        Judgment: Judgment normal.   Patient has a small umbilical hernia        Assessment & Plan:  Need for vaccination - Plan: Zoster Recombinant (Shingrix )  General medical exam  Coronary artery disease involving native coronary artery of native heart without angina pectoris  Pure hypercholesterolemia Patient's blood pressure today is excellent.  His LDL cholesterol is mildly elevated at  66.  However this is decrease since it was 59 in October.  His cardiologist added Zetia to his Lipitor.  We discussed switching to Repatha but the patient states that his insurance will not cover that medication for him.  The remainder of his lab work is excellent.  His PSA was recently checked at his urologist office.  His colonoscopy has been scheduled for next month.  He had a colonoscopy 1 years ago that showed more than 10 polyps.  Therefore they are rechecking him this year.  Patient received the shingles vaccine today.  He declines the RSV vaccine.  The remainder of his immunizations are up-to-date Immunization History  Administered Date(s) Administered   Fluad Quad(high Dose 65+) 11/18/2018, 12/16/2019, 01/24/2021, 01/15/2022   Fluad Trivalent(High Dose 65+) 12/26/2022   Influenza,inj,Quad PF,6+ Mos 02/06/2016, 12/31/2016, 12/01/2017   PFIZER(Purple Top)SARS-COV-2 Vaccination 04/23/2019, 05/14/2019   Pneumococcal Conjugate-13 01/16/2017   Pneumococcal Polysaccharide-23 02/06/2016   Zoster Recombinant(Shingrix) 04/29/2023

## 2023-04-29 NOTE — Progress Notes (Signed)
04/29/2023 Todd Patterson [id number] [birth year]   Chief Complaint: Schedule a colonoscopy   History of Present Illness: Todd Patterson. 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  Status post appendectomy 1974.  He presents today to schedule a colonoscopy. His initial screening colonoscopy was done by Dr. 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 A repeat colonoscopy was done 08/06/2016 which identified one > 50mm tubulovillous polyp removed from the cecum, incomplete resection, one hyperplastic polyp was removed from the rectum and diverticulosis in the sigmoid colon. He was referred to [facility] and underwent a colonoscopy 10/17/2016 and the 35mm cecal polyp was removed via mucosal resection and clips were placed. A 3 month follow up colonoscopy 01/29/2017 did not show any evidence of residual TV polyp to the cecum and 8 or 9 tubular adenomatous polyps were removed from the colon. Previously evaluated by genetic counselor 06/2021. His most recent colonoscopy was 03/25/2022 which identified 11 polyps measuring 1 to 11 mm which were removed from the colon and internal hemorrhoids.  Path report consistent with tubular adenomatous polyps and 2 fragments with hyperplastic change without evidence of dysplasia or malignancy.  He was advised to repeat a colonoscopy in 1 year.  He denies having any abdominal pain. He passes a normal brown formed stool once daily. No rectal bleeding or black stools. No GERD symptoms on  Pantoprazole 40 mg daily.  No dysphagia. No chest pain. No palpitations, dizziness or SOB. He walks 3 miles daily on the treadmill. 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   He was briefly admitted to the hospital 10/28 - 01/14/2023 due to having chest pain.  EKG showed normal sinus rhythm with abnormal R wave progression.  Troponin levels were negative.  Chest CTA showed diffuse calcified atherosclerotic plaque without dissection or aneurysm.  Abd/pelvic CT showed a 3.9 cm cystic lesion in the right lower quadrant caudal to the cecum, possible appendiceal lesion/mucocele or other cystic mass, 15 mm left adrenal mass, right liver lesion measuring 12 mm in addition to separate hepatic cysts, renal and splenic cysts. An abdominal MRI w/wo contrast showed nonenhancing liver lesions compatible with simple cysts and/or small biliary hamartomas and some areas of hypervascular enhancement were noted in the right lobe of the liver without identifying any suspicious liver lesions or biliary ductal dilatation, 4.7 cm splenic simple cyst with several smaller similar-appearing lesions adjacent to it and a 1.3 cm right kidney cyst and a 2.1 cm left adrenal gland lesion likely represents a benign mildly lipoma or lipid rich adenoma and no further imaging was recommended.  ECHO was normal.  His clinical status was stable and there was no concern for ACS and he was discharged home 01/14/2023.  An outpatient cardiac PET study 02/06/2023 was negative for ischemia.  He was seen by his cardiologist Dr. Nanetta Patterson 03/03/2023 who reviewed his chest CTA, ECHO and cardiac PET study and no further cardiac workup was required at that juncture and he was advised to follow-up in 3 months.  He denies having any chest pain since his hospital admission 01/13/2023.  In regard to the 3.9 cm cystic lesion in the RLQ seen on CTA as noted above, he underwent an outpatient pelvic MRI 02/12/2023  which identified the prior.  Appendiceal lesion seen on CT was consistent with an outpouching from the cecum without suspicious mass.  Note, patient underwent an appendectomy in 1974.     Latest Ref Rng & Units 04/21/2023    8:02 AM 01/14/2023    4:16 AM 01/13/2023   10:40 AM  CBC  WBC 3.8 - 10.8 Thousand/uL 11.3  10.2  11.8   Hemoglobin 13.2 - 17.1 g/dL 78.2  95.6  21.3   Hematocrit 38.5 - 50.0 % 46.3  43.2  48.5   Platelets 140 - 400 Thousand/uL 265  221  261        Latest Ref Rng & Units 04/21/2023    8:02 AM 01/21/2023    9:55 AM 01/14/2023    4:16 AM  CMP  Glucose 65 - 99 mg/dL 87  98  086   BUN 7 - 25 mg/dL 16  15  13    Creatinine 0.70 - 1.28 mg/dL 5.78  4.69  6.29   Sodium 135 - 146 mmol/L 140  143  139   Potassium 3.5 - 5.3 mmol/L 3.8  5.0  3.8   Chloride 98 - 110 mmol/L 97  104  103   CO2 20 - 32 mmol/L 33  28  28   Calcium 8.6 - 10.3 mg/dL 9.6  9.3  9.0   Total Protein 6.1 - 8.1 g/dL 6.6     Total Bilirubin 0.2 - 1.2 mg/dL 0.7     AST 10 - 35 U/L 16     ALT 9 - 46 U/L 23       Chest/abdominal/pelvic CTA 01/13/2023:  FINDINGS: CTA CHEST FINDINGS   Cardiovascular: On the noncontrast dataset no curvilinear high density material along the course of the thoracic aorta. Scattered coronary and aortic calcified atherosclerotic plaque identified there is also some plaque along the origin of the great vessels. Diameter of the ascending aorta at the level of the right pulmonary artery measures 3.8 by 3.8 cm. The descending thoracic aorta same level measures 2.8 by 2.8 cm. Distal aortic arch diameter of 2.6 cm. Diameter of the very proximal ascending aorta just above the aortic root measures a diameter of 3.2 cm. No dissection or aneurysm formation. Heart is nonenlarged. Trace pericardial fluid.   Mediastinum/Nodes: Slightly heterogeneous thyroid gland. Normal caliber thoracic esophagus. No specific abnormal lymph node enlargement identified in the axillary regions, hilum  or mediastinum. Only a few small less than 1 cm size nodes are seen in the hilum and mediastinum, nonpathologic by size criteria.   Lungs/Pleura: No pneumothorax, effusion or consolidation. Few areas of peripheral ground-glass. There are some peripheral areas of interstitial septal thickening. Scarring and fibrotic changes. No consolidation, pneumothorax or effusion.   Musculoskeletal: Osteopenia. Scattered degenerative changes. Old left-sided rib fractures. These are healed.   Review of the MIP images confirms the above findings.   CTA ABDOMEN AND PELVIS FINDINGS   VASCULAR   Aorta: Scattered vascular calcifications identified. No dissection or aneurysm formation.   Celiac: Patent without evidence of aneurysm, dissection, vasculitis or significant stenosis.   SMA: Patent without evidence of aneurysm, dissection, vasculitis or significant stenosis.   Renals: Scattered calcified plaque. There are 2 bilateral renal arteries.   IMA: Patent without evidence of aneurysm, dissection, vasculitis or  significant stenosis.   Inflow: Scattered calcified plaque along the iliac vessels. Mild areas of stenosis seen along the distal left common iliac.   Veins: No obvious venous abnormality within the limitations of this arterial phase study.   Review of the MIP images confirms the above findings.   NON-VASCULAR   Hepatobiliary: With the limits of the early phase of the arterial bolus, there is smaller areas of asymmetric enhancement in the right hepatic lobe on series 10, image 142 measuring 13 mm. Probable vascular lesion. Separate benign-appearing cyst in segment 4. Gallbladder is nondilated.   Pancreas: Unremarkable. No pancreatic ductal dilatation or surrounding inflammatory changes.   Spleen: Multiple benign-appearing splenic cysts.   Adrenals/Urinary Tract: Right adrenal gland is preserved. 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 There is however a separate smaller focus more caudal which is more indeterminate measuring 15 mm on series 10, image 143. Not clearly a benign lesion. Nonspecific perinephric stranding. No enhancing renal mass or collecting system dilatation. Exophytic Bosniak 1 right-sided renal cyst medial measuring 15 mm with Hounsfield unit of close to 0 on series 10, image 185. The ureters have normal course and caliber down to the bladder. Preserved contours of the urinary bladder.   Stomach/Bowel: On this non oral contrast exam, the large bowel has a normal course and caliber. Stomach and small bowel are nondilated. Just caudal to the base of the cecum but separate from the terminal ileum is a cystic lesion. On coronal series 12, image 78 this measures 3.9 cm. Axial series 10, image 265. Based on appearance and location this could be an appendiceal lesion such as a mucocele or other process.   Lymphatic: No specific abnormal lymph node enlargement identified in the abdomen and pelvis.   Reproductive: Brachytherapy changes in the location of the prostate.   Other: No free air or free fluid. Small bilateral fat containing inguinal hernias, left-greater-than-right. Small fat containing umbilical hernia as well.   Musculoskeletal: Scattered degenerative changes of the spine and pelvis. Osteopenia. Unilateral left-sided pars defect at L5 with trace listhesis.   Review of the MIP images confirms the above findings.   IMPRESSION: Diffuse calcified atherosclerotic plaque. No dissection or aneurysm formation identified.   No consolidation, pneumothorax or effusion. Some chronic lung changes.   3.9 cm cystic lesion identified in the right lower quadrant caudal to the cecum. This appears separate from the terminal ileum but the appendix is not seen as a separate structure. Possibilities would include an appendiceal lesion such as a mucocele or other cystic mass lesion. Overall  recommend further evaluation of the structure.   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 Please correlate with any prior or dedicated workup of the left adrenal gland when clinically appropriate such as MRI or pre and postcontrast washout CT.   Separate hepatic, renal and splenic cysts   Hypervascular right hepatic lobe liver lesion measuring 12 mm. This also has a differential. This also could be assessed on a follow up dynamic MRI with and without contrast. Please correlate with any prior as well.  Abdominal MRI with and without contrast 01/13/2023: FINDINGS: Lower chest: Unremarkable.   Hepatobiliary: Well-defined T1 hypointense, T2 hyperintense, nonenhancing lesions are noted in the liver, compatible with simple cysts and/or small biliary hamartomas, largest of which measures 1.9 cm in the periphery of segment 4A. Some areas of hypervascular enhancement are  noted in the right lobe of the liver, without corresponding signal abnormality on pre gadolinium T1 or T2 weighted images, and with normalization of signal intensity on more delayed phase post gadolinium imaging, compatible with benign perfusion anomalies (Transient Hepatic Intensity Differences (THIDs)). No other suspicious appearing cystic or solid hepatic lesions are noted. No intra or extrahepatic biliary ductal dilatation.   Pancreas: No pancreatic mass. No pancreatic ductal dilatation. No pancreatic or peripancreatic fluid collections or inflammatory changes.   Spleen: Well-defined T1 hypointense, T2 hyperintense nonenhancing lesion measuring 4.7 cm in the anterior aspect of the spleen has imaging characteristics compatible with a simple cyst, with several smaller similar-appearing lesions adjacent to it (no imaging follow-up recommended).   Adrenals/Urinary Tract: Exophytic 1.3 cm T1 hypointense, T2 hyperintense,  nonenhancing lesion noted in the medial aspect of the right kidney, compatible with a simple cyst (no imaging follow-up recommended). Left kidney and right adrenal gland are normal in appearance. In the left adrenal gland there is a lesion of complex signal intensity measuring 2.1 cm in diameter which demonstrates predominantly fatty signal intensity, and has other components which demonstrate diffuse loss of signal intensity on out of phase dual echo imaging, indicative of significant intracellular lipid. This either represents a benign myelolipoma or lipid rich adenoma (no imaging follow-up recommended). No hydroureteronephrosis in the visualized portions of the abdomen.   Stomach/Bowel: Visualized portions of the abdomen are unremarkable. Pelvis was not comprehensively imaged on this abdominal examination.   Vascular/Lymphatic: Aortic atherosclerosis, without evidence of aneurysm in the visualized abdominal vasculature. No lymphadenopathy noted in the abdomen.   Other: No significant volume of ascites noted in the visualized portions of the peritoneal cavity.   Musculoskeletal: No aggressive appearing osseous lesions are noted in the visualized portions of the skeleton.   IMPRESSION: 1. Lesion of concern in the right lobe of the liver is not confidently identified on today's magnetic resonance examination, although other perfusion anomalies are evident in the adjacent parenchyma. The finding on the recent CT examination was also likely a benign perfusion anomaly. 2. Left adrenal lesion has benign imaging characteristics, either a myelolipoma or lipid rich adenoma. No imaging follow-up is recommended. 3. Aortic atherosclerosis.  ECHO 01/14/2023: Left ventricular ejection fraction, by estimation, is 60 to 65%. The left ventricle has normal function. The left ventricle has no regional wall motion abnormalities. There is moderate concentric left ventricular hypertrophy. Left  ventricular diastolic parameters are consistent with Grade I diastolic dysfunction (impaired relaxation). Elevated left ventricular end-diastolic pressure. 1. Right ventricular systolic function is normal. The right ventricular size is normal. There is normal pulmonary artery systolic pressure. The estimated right ventricular systolic pressure is 30.1 mmHg. 2. The mitral valve is normal in structure. No evidence of mitral valve regurgitation. No evidence of mitral stenosis. 3. The aortic valve is tricuspid. Aortic valve regurgitation is not visualized. Aortic valve sclerosis/calcification is present, without any evidence of aortic stenosis. Aortic valve area, by VTI measures 2.93 cm. Aortic valve mean gradient measures 3.0 mmHg. Aortic valve Vmax measures 1.14 m/s. 4. The inferior vena cava is dilated in size with >50% respiratory variability, suggesting right atrial pressure of 8 mmHg.  Cardiac PET study 03/08/2023: No evidence of scar or ischemia.   PAST GI PROCEDURES:   Colonoscopy 04/24/2016 by Dr. Lavon Patterson: - One 4 mm polyp at the appendiceal orifice, removed with a cold snare. Resected and retrieved. - Rule out malignancy, 40mm polypoid lesion in the cecum. Biopsied. - Four 15 to 20 mm polyps in  the sigmoid colon, in the descending colon and in the transverse colon, removed with a hot snare. Resected and retrieved. - Two 7 to 9 mm polyps in the sigmoid colon and in the ascending colon, removed with a cold snare. Resected and retrieved. - Non-bleeding internal hemorrhoids. 1. Colon, polyp(s), cecum, ascending, polyp (2) - TUBULAR ADENOMA (X4 FRAGMENTS). - NO HIGH GRADE DYSPLASIA OR MALIGNANCY. 2. Colon, polyp(s), cecum (large, friable), polyp - MULTIPLE SUPERFICIAL FRAGMENTS OF TUBULOVILLOUS ADENOMA, SEE COMMENT. - NO HIGH GRADE DYSPLASIA OR MALIGNANCY. 3. Colon, polyp(s), transverse, polyp - TUBULAR ADENOMA (X6 FRAGMENTS). - NO HIGH GRADE DYSPLASIA OR MALIGNANCY. 4. Colon, polyp(s),  sigmoid and descending, polyp (4) - MULTIPLE FRAGMENTS OF TUBULAR ADENOMA. - NO HIGH GRADE DYSPLASIA OR MALIGNANCY.   Colonoscopy 08/06/2016 by Dr. Lavon Patterson: - Perianal rash found on perianal exam. - One greater than 50 mm polyp in the cecum, removed with a hot snare. Incomplete resection. Resected tissue retrieved. Clip (MR conditional) was placed. Clips (MR conditional) were placed. - One 2 mm polyp in the rectum, removed with a cold biopsy forceps. Resected and retrieved. - Diverticulosis in the sigmoid colon. - Non-bleeding internal hemorrhoids. 1. Surgical [P], cecum, polyp - MULTIPLE FRAGMENTS OF TUBULOVILLOUS ADENOMA. - NO HIGH GRADE DYSPLASIA OR MALIGNANCY. 2. Surgical [P], rectum, polyp - HYPERPLASTIC POLYP (X2 FRAGMENTS). - NO HIGH GRADE DYSPLASIA OR MALIGNANCY.   Colonoscopy 10/17/2016 at [facility]:  35mm polyp in the cecum removed with mucosal resection, clips placed Repeat colonoscopy in 3 months  Multiple fragments of villous adenoma. No high grade dysplasia or carcinoma is seen.   Colonoscopy 01/29/2017 at [facility]: Post polypectomy scare in the cecum, biopsies Eight 1 to 3 mm polyps removed from the descending, transverse, ascending colon and cecum and one 5mm polyp removed from the ascending colon Repeat colonoscopy in 1 year A.  Colon, cecal polypectomy site, endoscopic biopsy:   Colonic mucosa with mild architectural disarray and reactive changes. Negative for residual adenomatous mucosa.   B.  Colon polyps, endoscopic polypectomy:   Tubular adenoma, multiple fragments.   C.  Descending colon nodule, endoscopic biopsy:   Colonic mucosa with no significant pathologic diagnosis  Colonoscopy 03/25/2022 by Dr. Lavon Patterson:  - One less than 1 mm polyp in the ascending colon, removed with a cold biopsy forceps. Resected and retrieved. - Ten 3 to 11 mm polyps in the sigmoid colon, in the transverse colon, in the ascending colon and in the cecum, removed with a cold snare.  Resected and retrieved.  - Non-bleeding internal hemorrhoids - Recall colonoscopy 1 year   1. Surgical [P], colon, ascending and cecum, transverse, polyp (5) - TUBULAR ADENOMA(S) (6 FRAGMENTS) - COLONIC MUCOSA WITH SURFACE HYPERPLASTIC CHANGE (2 FRAGMENTS) - NEGATIVE FOR HIGH-GRADE DYSPLASIA OR MALIGNANCY 2. Surgical [P], colon, descending, sigmoid, polyp (6) - TUBULAR ADENOMA(S) (5 OF 5 FRAGMENTS) - NEGATIVE FOR HIGH-GRADE DYSPLASIA OR MALIGNANCY  Past Surgical History:  Procedure Laterality Date   APPENDECTOMY  51   age 68   CORONARY ANGIOPLASTY WITH STENT PLACEMENT  2010   DES x1 LAD  (in [place])   CYSTOSCOPY N/A 01/15/2021   Procedure: CYSTOSCOPY FLEXIBLE;  Surgeon: Jannifer Hick, MD;  Location: [facility];  Service: Urology;  Laterality: N/A;  No seeds detected in bladder per Dr. Cardell Peach   PARATHYROIDECTOMY N/A 07/04/2016   Procedure: PARATHYROIDECTOMY;  Surgeon: Avel Peace, MD;  Location: [facility];  Service: General;  Laterality: N/A;   RADIOACTIVE SEED IMPLANT N/A 01/15/2021   Procedure: RADIOACTIVE  SEED IMPLANT/BRACHYTHERAPY IMPLANT;  Surgeon: Jannifer Hick, MD;  Location[facility];  Service: Urology;  Laterality: N/A;   SPACE OAR INSTILLATION N/A 01/15/2021   Procedure: SPACE OAR INSTILLATION;  Surgeon: Jannifer Hick, MD;  Location: [facility];  Service: Urology;  Laterality: N/A;   TONSILLECTOMY AND ADENOIDECTOMY  1970   age 31      Current Outpatient Medications on File Prior to Visit  Medication Sig Dispense Refill   atorvastatin (LIPITOR) 80 MG tablet TAKE 1 TABLET BY MOUTH EVERY DAY (Patient taking differently: Take 80 mg by mouth daily.) 90 tablet 1   carvedilol (COREG) 6.25 MG tablet Take 1 tablet (6.25 mg total) by mouth 2 (two) times daily with a meal. 180 tablet 3   clopidogrel (PLAVIX) 75 MG tablet TAKE 1 TABLET (75 MG TOTAL) BY MOUTH DAILY. PLEASE REQUEST FUTURE REFILLS FROM PCP. (Patient taking  differently: Take 75 mg by mouth in the morning.) 90 tablet 1   diazepam (VALIUM) 10 MG tablet Take 1 tablet (10 mg total) by mouth once as needed for up to 1 dose for anxiety (1 hou rbefore mri.). 3 tablet 1   ezetimibe (ZETIA) 10 MG tablet Take 1 tablet (10 mg total) by mouth daily. 90 tablet 3   hydrochlorothiazide (HYDRODIURIL) 25 MG tablet Take 1 tablet (25 mg total) by mouth daily. 90 tablet 3   ibuprofen (ADVIL) 200 MG tablet Take 600 mg by mouth every 6 (six) hours as needed for mild pain (pain score 1-3) or headache.     latanoprost (XALATAN) 0.005 % ophthalmic solution Place 1 drop into both eyes at bedtime.     losartan (COZAAR) 100 MG tablet Take 1 tablet (100 mg total) by mouth daily. 90 tablet 3   pantoprazole (PROTONIX) 40 MG tablet Take 1 tablet (40 mg total) by mouth daily. 90 tablet 0   tamsulosin (FLOMAX) 0.4 MG CAPS capsule Take 0.4 mg by mouth at bedtime.     timolol (BETIMOL) 0.5 % ophthalmic solution Place 1 drop into both eyes in the morning.     nitroGLYCERIN (NITROSTAT) 0.4 MG SL tablet Place 1 tablet (0.4 mg total) under the tongue every 5 (five) minutes as needed for chest pain. (Patient not taking: Reported on 04/29/2023) 25 tablet 1   No current facility-administered medications on file prior to visit.   Allergies  Allergen Reactions   Lisinopril Cough   Current Medications, Allergies, Past Medical History, Past Surgical History, Family History and Social History were reviewed in [employer] record.  Review of Systems:   Constitutional: Negative for fever, sweats, chills or weight loss.  Respiratory: Negative for shortness of breath.   Cardiovascular: Negative for chest pain, palpitations and leg swelling.  Gastrointestinal: See HPI.  Musculoskeletal: Negative for back pain or muscle aches.  Neurological: Negative for dizziness, headaches or paresthesias.   Physical Exam: BP 120/70 (BP Location: Left Arm, Patient Position: Sitting, Cuff  Size: Normal)   Pulse 68 Comment: irregular  Ht 5\' 8"  (1.727 m)   Wt 222 lb 2 oz (100.8 kg)   BMI 33.77 kg/m   Wt Readings from Last 3 Encounters:  04/29/23 222 lb 2 oz (100.8 kg)  03/03/23 231 lb (104.8 kg)  01/30/23 227 lb (103 kg)    General: 73 year old male in no acute distress. Head: Normocephalic and atraumatic. Eyes: No scleral icterus. Conjunctiva pink . Ears: Normal auditory acuity. Mouth: Dentition intact. No ulcers or lesions.  Lungs: Clear throughout  to auscultation. Heart: Regular rate and rhythm, no murmur. Abdomen: Soft, protuberant, nontender and nondistended. No masses or hepatomegaly. Normal bowel sounds x 4 quadrants.  Rectal: Deferred.  Musculoskeletal: Symmetrical with no gross deformities. Extremities: No edema. Neurological: Alert oriented x 4. No focal deficits.  Psychological: Alert and cooperative. Normal mood and affect  Assessment and Recommendations:  73 year old male with a history of numerous tubular adenomatous colon polyps and one large cecal tubulovillous polyp removed by mucosal resection at [facility] in 10/2016, follow up colonoscopy 01/2017 did not show any evidence of residual TV polyp to the cecum and 8 or 9 tubular adenomatous polyps were removed from the colon. His most recent colonoscopy was 03/25/2022 which identified 11 polyps measuring 1 to 11 mm which were removed from the colon and internal hemorrhoids.  Path report consistent with tubular adenomatous polyps and 2 fragments with hyperplastic change without evidence of dysplasia or malignancy.  -Colonoscopy benefits and risks discussed including risk with sedation, risk of bleeding, perforation and infection  -Our office will contact Dr. [name] to verify Plavix hold instructions prior to the patient proceeding with a colonoscopy -Further recommendations to be determined after colonoscopy completed  Coronary artery disease s/p MI, s/p DES in 2010 on Plavix.  LVEF 60 to 65% with grade 1  diastolic dysfunction per ECHO 16/12/9602 cardiac PET study 03/08/2023: No evidence of scar or ischemia.    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 Pelvic MRI showed an outpouching of the cecum. S/P appendectomy 1994.

## 2023-05-05 ENCOUNTER — Other Ambulatory Visit: Payer: Self-pay | Admitting: Family Medicine

## 2023-05-05 NOTE — Telephone Encounter (Signed)
Prescription Request  05/05/2023  LOV: 04/29/2023  What is the name of the medication or equipment?   atorvastatin (LIPITOR) 80 MG tablet   Have you contacted your pharmacy to request a refill? Yes   Which pharmacy would you like this sent to?  [facility] [place], [place] - [address] [place] [address] Phone: [phone] Fax: [phone]    Patient notified that their request is being sent to the clinical staff for review and that they should receive a response within 2 business days.   Please advise pharmacist.

## 2023-05-06 MED ORDER — ATORVASTATIN CALCIUM 80 MG PO TABS: 80.0000 mg | ORAL_TABLET | Freq: Every day | ORAL | 1 refills | Status: DC

## 2023-05-06 NOTE — Telephone Encounter (Signed)
Requested Prescriptions  Pending Prescriptions Disp Refills   atorvastatin (LIPITOR) 80 MG tablet 90 tablet 1    Sig: Take 1 tablet (80 mg total) by mouth daily.     Cardiovascular:  Antilipid - Statins Failed - 05/06/2023  9:59 AM      Failed - Valid encounter within last 12 months    Recent Outpatient Visits           3 years ago Essential hypertension   [facility] [name], [name], MD   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, left   [facility] [name], [name], MD   4 years ago Chronic pain of right knee   [facility] [name], [name], MD   4 years ago Acute pain of right knee   [facility] [name], [name], MD   5 years ago Upper airway cough syndrome   [facility] Donita Brooks, MD              Failed - Lipid Panel in normal range within the last 12 months    Cholesterol, Total  Date Value Ref Range Status  07/08/2019 192 100 - 199 mg/dL Final   Cholesterol  Date Value Ref Range Status  04/21/2023 146 <200 mg/dL Final   LDL Cholesterol (Calc)  Date Value Ref Range Status  04/21/2023 66 mg/dL (calc) Final    Comment:    Reference range: <100 . Desirable range <100 mg/dL for primary prevention;   <70 mg/dL for patients with CHD or diabetic patients  with > or = 2 CHD risk factors. Marland Kitchen LDL-C is now calculated using the Martin-Hopkins  calculation, which is a validated novel method providing  better accuracy than the Friedewald equation in the  estimation of LDL-C.  Horald Pollen et al. Lenox Ahr. 8756;433(29): 2061-2068  (http://education.[facility].com/faq/FAQ164)    HDL  Date Value Ref Range Status  04/21/2023 58 > OR = 40 mg/dL Final  51/88/4166 65 >06 mg/dL Final   Triglycerides  Date Value Ref Range Status  04/21/2023 136 <150 mg/dL Final         Passed - Patient is not pregnant

## 2023-05-08 ENCOUNTER — Other Ambulatory Visit: Payer: Self-pay

## 2023-05-08 ENCOUNTER — Telehealth: Payer: Self-pay | Admitting: Family Medicine

## 2023-05-08 MED ORDER — ATORVASTATIN CALCIUM 80 MG PO TABS: 80.0000 mg | ORAL_TABLET | Freq: Every day | ORAL | 1 refills | Status: DC

## 2023-05-08 NOTE — Telephone Encounter (Signed)
Prescription Request  05/08/2023  LOV: 04/29/2023 **Please see pharmacy change request info below**  What is the name of the medication or equipment?   atorvastatin (LIPITOR) 80 MG tablet  **Patient stated he did not receive the refill from [facility]  **Patient now has BCBS and wants his pharmacy permanently changed to [facility] in [place] (info listed below)**  Have you contacted your pharmacy to request a refill? Yes   Which pharmacy would you like this sent to?  [id number] - [place], [place] - [address] [place] [address] Phone: [phone] Fax: [phone]    Patient notified that their request is being sent to the clinical staff for review and that they should receive a response within 2 business days.   Please advise pharmacist.

## 2023-05-27 ENCOUNTER — Other Ambulatory Visit: Payer: Self-pay

## 2023-05-27 ENCOUNTER — Telehealth: Payer: Self-pay | Admitting: Family Medicine

## 2023-05-27 MED ORDER — CLOPIDOGREL BISULFATE 75 MG PO TABS: 75.0000 mg | ORAL_TABLET | Freq: Every day | ORAL | 1 refills | Status: DC

## 2023-05-27 NOTE — Telephone Encounter (Signed)
 Prescription Request  05/27/2023  LOV: 04/29/2023  What is the name of the medication or equipment?   clopidogrel (PLAVIX) 75 MG tablet [[id number]]   Have you contacted your pharmacy to request a refill? Yes   Which pharmacy would you like this sent to?  [id number] [place], [place] - [address] [place] [address] Phone: [phone] Fax: [phone]    Patient notified that their request is being sent to the clinical staff for review and that they should receive a response within 2 business days.   Please advise pharmacist.

## 2023-05-30 ENCOUNTER — Telehealth: Payer: Self-pay

## 2023-05-30 NOTE — Telephone Encounter (Signed)
 Pt's wife came in to ask if pcp would send in this med clopidogrel (PLAVIX) 75 MG tablet [[id number]]  around 07/14/23 due to pt having surgery, pt has enough to last. Pt would also like to make sure that all meds are now sent to [facility] in [place].  Cb#: [phone]

## 2023-06-06 ENCOUNTER — Encounter: Payer: Self-pay | Admitting: Gastroenterology

## 2023-06-06 ENCOUNTER — Ambulatory Visit: Payer: Medicare Other | Admitting: Gastroenterology

## 2023-06-06 VITALS — BP 118/76 | HR 53 | Temp 98.2°F | Resp 15 | Ht 68.0 in | Wt 222.0 lb

## 2023-06-06 DIAGNOSIS — K5730 Diverticulosis of large intestine without perforation or abscess without bleeding: Secondary | ICD-10-CM | POA: Diagnosis not present

## 2023-06-06 DIAGNOSIS — D122 Benign neoplasm of ascending colon: Secondary | ICD-10-CM | POA: Diagnosis not present

## 2023-06-06 DIAGNOSIS — K644 Residual hemorrhoidal skin tags: Secondary | ICD-10-CM

## 2023-06-06 DIAGNOSIS — Z860100 Personal history of colon polyps, unspecified: Secondary | ICD-10-CM

## 2023-06-06 DIAGNOSIS — Z1211 Encounter for screening for malignant neoplasm of colon: Secondary | ICD-10-CM | POA: Diagnosis not present

## 2023-06-06 DIAGNOSIS — K648 Other hemorrhoids: Secondary | ICD-10-CM

## 2023-06-06 DIAGNOSIS — D123 Benign neoplasm of transverse colon: Secondary | ICD-10-CM

## 2023-06-06 DIAGNOSIS — K635 Polyp of colon: Secondary | ICD-10-CM | POA: Diagnosis not present

## 2023-06-06 MED ORDER — SODIUM CHLORIDE 0.9 % IV SOLN: 500.0000 mL | Freq: Once | INTRAVENOUS | Status: DC

## 2023-06-06 NOTE — Patient Instructions (Signed)
 Resume previous diet and medications. Awaiting pathology results. Repeat Colonoscopy in 3 years for surveillance. Resume Plavix tomorrow at previous dose. Handouts provided on: Colon polyps, Diverticulosis and Hemorrhoids  YOU HAD AN ENDOSCOPIC PROCEDURE TODAY AT THE [facility]:   Refer to the procedure report that was given to you for any specific questions about what was found during the examination.  If the procedure report does not answer your questions, please call your gastroenterologist to clarify.  If you requested that your care partner not be given the details of your procedure findings, then the procedure report has been included in a sealed envelope for you to review at your convenience later.  YOU SHOULD EXPECT: Some feelings of bloating in the abdomen. Passage of more gas than usual.  Walking can help get rid of the air that was put into your GI tract during the procedure and reduce the bloating. If you had a lower endoscopy (such as a colonoscopy or flexible sigmoidoscopy) you may notice spotting of blood in your stool or on the toilet paper. If you underwent a bowel prep for your procedure, you may not have a normal bowel movement for a few days.  Please Note:  You might notice some irritation and congestion in your nose or some drainage.  This is from the oxygen used during your procedure.  There is no need for concern and it should clear up in a day or so.  SYMPTOMS TO REPORT IMMEDIATELY:  Following lower endoscopy (colonoscopy or flexible sigmoidoscopy):  Excessive amounts of blood in the stool  Significant tenderness or worsening of abdominal pains  Swelling of the abdomen that is new, acute  Fever of 100F or higher  For urgent or emergent issues, a gastroenterologist can be reached at any hour by calling [phone]. Do not use MyChart messaging for urgent concerns.    DIET:  We do recommend a small meal at first, but then you may proceed to your regular  diet.  Drink plenty of fluids but you should avoid alcoholic beverages for 24 hours.  ACTIVITY:  You should plan to take it easy for the rest of today and you should NOT DRIVE or use heavy machinery until tomorrow (because of the sedation medicines used during the test).    FOLLOW UP: Our staff will call the number listed on your records the next business day following your procedure.  We will call around 7:15- 8:00 am to check on you and address any questions or concerns that you may have regarding the information given to you following your procedure. If we do not reach you, we will leave a message.     If any biopsies were taken you will be contacted by phone or by letter within the next 1-3 weeks.  Please call us at [phone] if you have not heard about the biopsies in 3 weeks.    SIGNATURES/CONFIDENTIALITY: You and/or your care partner have signed paperwork which will be entered into your electronic medical record.  These signatures attest to the fact that that the information above on your After Visit Summary has been reviewed and is understood.  Full responsibility of the confidentiality of this discharge information lies with you and/or your care-partner.

## 2023-06-06 NOTE — Progress Notes (Signed)
 Pt's states no medical or surgical changes since previsit or office visit.

## 2023-06-06 NOTE — Op Note (Addendum)
 [facility] Patient Name: Todd Patterson Procedure Date: 06/06/2023 10:09 AM MRN: [id number] Endoscopist: Napoleon Form , MD, [id number] Age: 73 Referring MD:  Date of Birth: [birth year] Gender: Male Account #: [id number] Procedure:                Colonoscopy Indications:              Surveillance: History of numerous (> 10) adenomas                            on last colonoscopy (< 3 yrs), High risk colon                            cancer surveillance: Personal history of adenoma                            (10 mm or greater in size), Last colonoscopy:                            January 2024 Medicines:                Monitored Anesthesia Care Procedure:                Pre-Anesthesia Assessment:                           - Prior to the procedure, a History and Physical                            was performed, and patient medications and                            allergies were reviewed. The patient's tolerance of                            previous anesthesia was also reviewed. The risks                            and benefits of the procedure and the sedation                            options and risks were discussed with the patient.                            All questions were answered, and informed consent                            was obtained. Prior Anticoagulants: The patient                            last took Plavix (clopidogrel) 5 days prior to the                            procedure. ASA Grade Assessment: III - A patient  with severe systemic disease. After reviewing the                            risks and benefits, the patient was deemed in                            satisfactory condition to undergo the procedure.                           After obtaining informed consent, the colonoscope                            was passed under direct vision. Throughout the                            procedure, the patient's blood  pressure, pulse, and                            oxygen saturations were monitored continuously. The                            Olympus Scope [id number] was introduced through the                            anus and advanced to the the cecum, identified by                            appendiceal orifice and ileocecal valve. The                            colonoscopy was performed without difficulty. The                            patient tolerated the procedure well. The quality                            of the bowel preparation was good. The ileocecal                            valve, appendiceal orifice, and rectum were                            photographed. Scope In: 10:15:56 AM Scope Out: 10:34:11 AM Scope Withdrawal Time: 0 hours 11 minutes 13 seconds  Total Procedure Duration: 0 hours 18 minutes 15 seconds  Findings:                 The perianal and digital rectal examinations were                            normal.                           Two sessile polyps were found in the transverse  colon and ascending colon. The polyps were 3 to 5                            mm in size. These polyps were removed with a cold                            snare. Resection and retrieval were complete.                           Scattered small-mouthed diverticula were found in                            the sigmoid colon and descending colon.                           Non-bleeding external and internal hemorrhoids were                            found during retroflexion. The hemorrhoids were                            medium-sized. Complications:            No immediate complications. Estimated Blood Loss:     Estimated blood loss was minimal. Impression:               - Two 3 to 5 mm polyps in the transverse colon and                            in the ascending colon, removed with a cold snare.                            Resected and retrieved.                           -  Diverticulosis in the sigmoid colon and in the                            descending colon.                           - Non-bleeding external and internal hemorrhoids. Recommendation:           - Patient has a contact number available for                            emergencies. The signs and symptoms of potential                            delayed complications were discussed with the                            patient. Return to normal activities tomorrow.  Written discharge instructions were provided to the                            patient.                           - Resume previous diet.                           - Continue present medications.                           - Await pathology results.                           - Repeat colonoscopy in 3 years for surveillance                            based on pathology results.                           - Resume Plavix (clopidogrel) at prior dose                            tomorrow. Napoleon Form, MD 06/06/2023 10:45:09 AM This report has been signed electronically.

## 2023-06-06 NOTE — Progress Notes (Signed)
 Pt A/O x 3, gd SR's, pleased with anesthesia, report to RN

## 2023-06-06 NOTE — Progress Notes (Signed)
 Called to room to assist during endoscopic procedure.  Patient ID and intended procedure confirmed with present staff. Received instructions for my participation in the procedure from the performing physician.

## 2023-06-06 NOTE — Progress Notes (Signed)
 [facility] History and Physical   Primary Care Physician:  Donita Brooks, MD   Reason for Procedure:  History of adenomatous colon polyps  Plan:    Surveillance colonoscopy with possible interventions as needed     HPI: Todd Patterson is a very pleasant 73 y.o. male here for surveillance colonoscopy. Denies any nausea, vomiting, abdominal pain, melena or bright red blood per rectum  The risks and benefits as well as alternatives of endoscopic procedure(s) have been discussed and reviewed. All questions answered. The patient agrees to proceed.    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   neurologist-- dr Terrace Arabia;  09-05-2017 acute onset right 6th nerve palsy with ischemic event,  started on plavix;  06/ 2021 left 6th nerve palsy probable same etiology as the right   Anticoagulant long-term use    plavix---  managed by pcp   Arthritis    Colon polyps    Coronary artery disease    cardiologist--- dr Allyson Sabal; 2010  hx MI s/p cath with DES x1  to LAD per cardiology note done in [place]   Dental bridge present    x2 upper  both permanent   Family history of breast cancer    Glaucoma, both eyes    History of COVID-19 03/21/2020   result in epic   History of myocardial infarction 2010   PCI w/ DES to LAD   Hx of colonic polyps    colonoscopy 2018, recommended repeat in 1 year due to numbe rof polyps.    Hyperlipidemia    Hypertension    Malignant neoplasm prostate [facility])    urologist-- dr gay;  dx 06/ 2022, gleason 3+3, PSA 8.82   Polyposis coli    Primary hyperparathyroidism (HCC)    S/P drug eluting coronary stent placement 2010   to LAD    Past Surgical History:  Procedure Laterality Date   APPENDECTOMY  1974   age 8   COLONOSCOPY     CORONARY ANGIOPLASTY WITH STENT PLACEMENT  2010   DES x1 LAD  (in [place])   CYSTOSCOPY N/A 01/15/2021   Procedure: CYSTOSCOPY FLEXIBLE;  Surgeon: Jannifer Hick, MD;  Location: [facility];  Service: Urology;  Laterality: N/A;  No seeds detected in bladder per Dr. Cardell Peach   PARATHYROIDECTOMY N/A 07/04/2016   Procedure: PARATHYROIDECTOMY;  Surgeon: Avel Peace, MD;  Location: [facility];  Service: General;  Laterality: N/A;   RADIOACTIVE SEED IMPLANT N/A 01/15/2021   Procedure: RADIOACTIVE SEED IMPLANT/BRACHYTHERAPY IMPLANT;  Surgeon: Jannifer Hick, MD;  [facility];  Service: Urology;  Laterality: N/A;   SPACE OAR INSTILLATION N/A 01/15/2021   Procedure: SPACE OAR INSTILLATION;  Surgeon: Jannifer Hick, MD;  Location: [facility];  Service: Urology;  Laterality: N/A;   TONSILLECTOMY AND ADENOIDECTOMY  1970   age 69    Prior to Admission medications   Medication Sig Start Date End Date Taking? Authorizing Provider  atorvastatin (LIPITOR) 80 MG tablet Take 1 tablet (80 mg total) by mouth daily. 05/08/23  Yes Donita Brooks, MD  carvedilol (COREG) 6.25 MG tablet Take 1 tablet (6.25 mg total) by mouth 2 (two) times daily with a meal. 01/30/23  Yes Donita Brooks, MD  ezetimibe (ZETIA) 10 MG tablet Take 1 tablet (10 mg total) by mouth daily. 02/07/23  Yes [name], MD  hydrochlorothiazide (HYDRODIURIL) 25 MG tablet Take 1 tablet (25 mg total)  by mouth daily. 01/30/23  Yes Donita Brooks, MD  latanoprost (XALATAN) 0.005 % ophthalmic solution Place 1 drop into both eyes at bedtime.   Yes [provider]  losartan (COZAAR) 100 MG tablet Take 1 tablet (100 mg total) by mouth daily. 12/26/22  Yes Donita Brooks, MD  pantoprazole (PROTONIX) 40 MG tablet Take 1 tablet (40 mg total) by mouth daily. 01/15/23  Yes Rodolph Bong, MD  tamsulosin (FLOMAX) 0.4 MG CAPS capsule Take 0.4 mg by mouth at bedtime. 04/18/23  Yes [provider]  timolol (BETIMOL) 0.5 % ophthalmic solution Place 1 drop into both eyes in the morning.   Yes [provider]  clopidogrel (PLAVIX) 75 MG tablet Take 1  tablet (75 mg total) by mouth daily. Please request future refills from PCP. 05/27/23   Donita Brooks, MD  ibuprofen (ADVIL) 200 MG tablet Take 600 mg by mouth every 6 (six) hours as needed for mild pain (pain score 1-3) or headache.    [provider]  nitroGLYCERIN (NITROSTAT) 0.4 MG SL tablet Place 1 tablet (0.4 mg total) under the tongue every 5 (five) minutes as needed for chest pain. 01/14/23   Rodolph Bong, MD    Current Outpatient Medications  Medication Sig Dispense Refill   atorvastatin (LIPITOR) 80 MG tablet Take 1 tablet (80 mg total) by mouth daily. 90 tablet 1   carvedilol (COREG) 6.25 MG tablet Take 1 tablet (6.25 mg total) by mouth 2 (two) times daily with a meal. 180 tablet 3   ezetimibe (ZETIA) 10 MG tablet Take 1 tablet (10 mg total) by mouth daily. 90 tablet 3   hydrochlorothiazide (HYDRODIURIL) 25 MG tablet Take 1 tablet (25 mg total) by mouth daily. 90 tablet 3   latanoprost (XALATAN) 0.005 % ophthalmic solution Place 1 drop into both eyes at bedtime.     losartan (COZAAR) 100 MG tablet Take 1 tablet (100 mg total) by mouth daily. 90 tablet 3   pantoprazole (PROTONIX) 40 MG tablet Take 1 tablet (40 mg total) by mouth daily. 90 tablet 0   tamsulosin (FLOMAX) 0.4 MG CAPS capsule Take 0.4 mg by mouth at bedtime.     timolol (BETIMOL) 0.5 % ophthalmic solution Place 1 drop into both eyes in the morning.     clopidogrel (PLAVIX) 75 MG tablet Take 1 tablet (75 mg total) by mouth daily. Please request future refills from PCP. 90 tablet 1   ibuprofen (ADVIL) 200 MG tablet Take 600 mg by mouth every 6 (six) hours as needed for mild pain (pain score 1-3) or headache.     nitroGLYCERIN (NITROSTAT) 0.4 MG SL tablet Place 1 tablet (0.4 mg total) under the tongue every 5 (five) minutes as needed for chest pain. 25 tablet 1   Current Facility-Administered Medications  Medication Dose Route Frequency Provider Last Rate Last Admin   0.9 %  sodium chloride infusion  500 mL  Intravenous Once Napoleon Form, MD        Allergies as of 06/06/2023 - Review Complete 06/06/2023  Allergen Reaction Noted   Lisinopril Cough 12/31/2016    Family History  Problem Relation Age of Onset   Dementia Mother    Heart disease Father    Cancer Sister        d. 26   Breast cancer Sister 32       d. 45   Colon polyps Sister    Throat cancer Nephew  d. 36   Cancer Niece        NOS, d. 33   Colon cancer Neg Hx    Esophageal cancer Neg Hx    Stomach cancer Neg Hx    Rectal cancer Neg Hx     Social History   Socioeconomic History   Marital status: Married    Spouse name: Not on file   Number of children: 2   Years of education: Not on file   Highest education level: Not on file  Occupational History   Occupation: retired  Tobacco Use   Smoking status: Former    Current packs/day: 0.00    Average packs/day: 2.0 packs/day for 42.0 years (84.0 ttl pk-yrs)    Types: Cigarettes    Start date: 48    Quit date: 2010    Years since quitting: 15.2   Smokeless tobacco: Never  Vaping Use   Vaping status: Never Used  Substance and Sexual Activity   Alcohol use: Yes    Alcohol/week: 14.0 - 21.0 standard drinks of alcohol    Types: 14 - 21 Cans of beer per week    Comment: 01-09-2021  pt stated 2-3 (12oz) beers daily   Drug use: No   Sexual activity: Yes  Other Topics Concern   Not on file  Social History Narrative   Not on file   Social Drivers of Health   Financial Resource Strain: Low Risk  (10/24/2022)   Overall Financial Resource Strain (CARDIA)    Difficulty of Paying Living Expenses: Not hard at all  Food Insecurity: No Food Insecurity (01/13/2023)   Hunger Vital Sign    Worried About Running Out of Food in the Last Year: Never true    Ran Out of Food in the Last Year: Never true  Transportation Needs: No Transportation Needs (01/13/2023)   PRAPARE - Administrator, Civil Service (Medical): No    Lack of Transportation  (Non-Medical): No  Physical Activity: Sufficiently Active (10/24/2022)   Exercise Vital Sign    Days of Exercise per Week: 4 days    Minutes of Exercise per Session: 120 min  Stress: No Stress Concern Present (10/24/2022)   [employer] of Occupational Health - Occupational Stress Questionnaire    Feeling of Stress : Not at all  Social Connections: Moderately Isolated (10/24/2022)   Social Connection and Isolation Panel [NHANES]    Frequency of Communication with Friends and Family: More than three times a week    Frequency of Social Gatherings with Friends and Family: More than three times a week    Attends Religious Services: Never    Database administrator or Organizations: No    Attends Banker Meetings: Never    Marital Status: Married  Catering manager Violence: Not At Risk (01/13/2023)   Humiliation, Afraid, Rape, and Kick questionnaire    Fear of Current or Ex-Partner: No    Emotionally Abused: No    Physically Abused: No    Sexually Abused: No    Review of Systems:  All other review of systems negative except as mentioned in the HPI.  Physical Exam: Vital signs in last 24 hours: BP (!) 185/92   Pulse 66   Temp 98.2 F (36.8 C) (Temporal)   Ht 5\' 8"  (1.727 m)   Wt 222 lb (100.7 kg)   SpO2 95%   BMI 33.75 kg/m  General:   Alert, NAD Lungs:  Clear .   Heart:  Regular rate and  rhythm Abdomen:  Soft, nontender and nondistended. Neuro/Psych:  Alert and cooperative. Normal mood and affect. A and O x 3  Reviewed labs, radiology imaging, old records and pertinent past GI work up  Patient is appropriate for planned procedure(s) and anesthesia in an ambulatory setting   K. Scherry Ran , MD [phone]

## 2023-06-09 ENCOUNTER — Telehealth: Payer: Self-pay

## 2023-06-09 NOTE — Telephone Encounter (Signed)
  Follow up Call-     06/06/2023    9:51 AM 03/25/2022    7:18 AM  Call back number  Post procedure Call Back phone  # [phone]  Permission to leave phone message Yes Yes     Patient questions:  Do you have a fever, pain , or abdominal swelling? No. Pain Score  0 *  Have you tolerated food without any problems? Yes.    Have you been able to return to your normal activities? Yes.    Do you have any questions about your discharge instructions: Diet   No. Medications  No. Follow up visit  No.  Do you have questions or concerns about your Care? No.  Actions: * If pain score is 4 or above: No action needed, pain <4.

## 2023-06-10 LAB — SURGICAL PATHOLOGY

## 2023-06-16 ENCOUNTER — Telehealth: Payer: Self-pay | Admitting: Cardiovascular Disease

## 2023-06-16 NOTE — Telephone Encounter (Signed)
 Called patient Dr.Berry's advice left on personal voice mail.

## 2023-06-16 NOTE — Telephone Encounter (Signed)
 Patient stated he joined a fitness center and wants to know if he can go in the hot tub whirlpool.

## 2023-06-16 NOTE — Telephone Encounter (Signed)
 Called patient left message on personal voice mail I will send message to [facility] for advice.

## 2023-06-19 DIAGNOSIS — K080 Exfoliation of teeth due to systemic causes: Secondary | ICD-10-CM | POA: Diagnosis not present

## 2023-06-19 NOTE — Telephone Encounter (Signed)
 Patient identification verified by 2 forms. Shade Flood, RN     Informed patient per Dr. Allyson Sabal he can absolutely go in the hot tub.   Patient verbalized understanding. No further questions at this time.

## 2023-07-04 ENCOUNTER — Encounter: Payer: Self-pay | Admitting: Family Medicine

## 2023-07-04 ENCOUNTER — Ambulatory Visit: Admitting: Family Medicine

## 2023-07-04 VITALS — BP 142/86 | HR 57 | Temp 97.8°F | Ht 68.0 in | Wt 224.6 lb

## 2023-07-04 DIAGNOSIS — Z23 Encounter for immunization: Secondary | ICD-10-CM

## 2023-07-04 DIAGNOSIS — L219 Seborrheic dermatitis, unspecified: Secondary | ICD-10-CM | POA: Diagnosis not present

## 2023-07-04 MED ORDER — KETOCONAZOLE 2 % EX SHAM: 1.0000 | MEDICATED_SHAMPOO | CUTANEOUS | 11 refills | Status: AC

## 2023-07-04 NOTE — Addendum Note (Signed)
 Addended by: Gillermo Lack K on: 07/04/2023 11:30 AM   Modules accepted: Orders

## 2023-07-04 NOTE — Progress Notes (Signed)
 Subjective:    Patient ID: Todd Patterson, male    DOB: 1950/04/07, 73 y.o.   MRN: [id number] Patient reports itchy scalp.  He also reports itching in his eyebrows in his mustache and around his nose.  Occasionally there is scale in that area although there is none visible today.  He states that occasionally there is erythema there.  It has been like this for several weeks.  On exam today there is mild erythema throughout the scalp but there is no visible scale.  There is no erythema in his eyebrows or in his mustache.  There is some mild white scale in between the hair fibers of his eyebrows and mustache. 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   neurologist-- dr Gracie Lav;  09-05-2017 acute onset right 6th nerve palsy with ischemic event,  started on plavix ;  06/ 2021 left 6th nerve palsy probable same etiology as the right   Anticoagulant long-term use    plavix ---  managed by pcp   Arthritis    Colon polyps    Coronary artery disease    cardiologist--- dr Katheryne Pane; 2010  hx MI s/p cath with DES x1  to LAD per cardiology note done in [place]    Dental bridge present    x2 upper  both permanent   Family history of breast cancer    Glaucoma, both eyes    History of COVID-19 03/21/2020   result in epic   History of myocardial infarction 2010   PCI w/ DES to LAD   Hx of colonic polyps    colonoscopy 2018, recommended repeat in 1 year due to numbe rof polyps.    Hyperlipidemia    Hypertension    Malignant neoplasm prostate [facility])    urologist-- dr gay;  dx 06/ 2022, gleason 3+3, PSA 8.82   Polyposis coli    Primary hyperparathyroidism (HCC)    S/P drug eluting coronary stent placement 2010   to LAD   Past Surgical History:  Procedure Laterality Date   APPENDECTOMY  1974   age 89   COLONOSCOPY     CORONARY ANGIOPLASTY WITH STENT PLACEMENT  2010   DES x1 LAD  (in [place] )   CYSTOSCOPY N/A 01/15/2021   Procedure: CYSTOSCOPY FLEXIBLE;  Surgeon: Lahoma Pigg, MD;  Location: [facility];  Service: Urology;  Laterality: N/A;  No seeds detected in bladder per Dr. Freddi Jaeger   PARATHYROIDECTOMY N/A 07/04/2016   Procedure: PARATHYROIDECTOMY;  Surgeon: Adalberto Hollow, MD;  Location[facility];  Service: General;  Laterality: N/A;   RADIOACTIVE SEED IMPLANT N/A 01/15/2021   Procedure: RADIOACTIVE SEED IMPLANT/BRACHYTHERAPY IMPLANT;  Surgeon: Lahoma Pigg, MD;  Location: [facility];  Service: Urology;  Laterality: N/A;   SPACE OAR INSTILLATION N/A 01/15/2021   Procedure: SPACE OAR INSTILLATION;  Surgeon: Lahoma Pigg, MD;  Location: [facility];  Service: Urology;  Laterality: N/A;   TONSILLECTOMY AND ADENOIDECTOMY  1970   age 54   Current Outpatient Medications on File Prior to Visit  Medication Sig Dispense Refill   atorvastatin  (LIPITOR) 80 MG tablet Take 1 tablet (80 mg total) by mouth daily. 90 tablet 1   carvedilol  (COREG ) 6.25 MG tablet Take 1 tablet (6.25 mg total) by mouth 2 (two) times daily with a meal. 180 tablet 3   clopidogrel  (PLAVIX ) 75 MG tablet Take 1 tablet (75 mg total) by mouth daily. Please request future refills from PCP. 90  tablet 1   ezetimibe  (ZETIA ) 10 MG tablet Take 1 tablet (10 mg total) by mouth daily. 90 tablet 3   hydrochlorothiazide  (HYDRODIURIL ) 25 MG tablet Take 1 tablet (25 mg total) by mouth daily. 90 tablet 3   ibuprofen (ADVIL) 200 MG tablet Take 600 mg by mouth every 6 (six) hours as needed for mild pain (pain score 1-3) or headache.     latanoprost  (XALATAN ) 0.005 % ophthalmic solution Place 1 drop into both eyes at bedtime.     losartan  (COZAAR ) 100 MG tablet Take 1 tablet (100 mg total) by mouth daily. 90 tablet 3   nitroGLYCERIN  (NITROSTAT ) 0.4 MG SL tablet Place 1 tablet (0.4 mg total) under the tongue every 5 (five) minutes as needed for chest pain. 25 tablet 1   pantoprazole  (PROTONIX ) 40 MG tablet Take 1 tablet (40 mg total) by mouth daily. 90 tablet 0    tamsulosin  (FLOMAX ) 0.4 MG CAPS capsule Take 0.4 mg by mouth at bedtime.     timolol  (BETIMOL ) 0.5 % ophthalmic solution Place 1 drop into both eyes in the morning.     No current facility-administered medications on file prior to visit.   Allergies  Allergen Reactions   Lisinopril  Cough   Social History   Socioeconomic History   Marital status: Married    Spouse name: Not on file   Number of children: 2   Years of education: Not on file   Highest education level: Not on file  Occupational History   Occupation: retired  Tobacco Use   Smoking status: Former    Current packs/day: 0.00    Average packs/day: 2.0 packs/day for 42.0 years (84.0 ttl pk-yrs)    Types: Cigarettes    Start date: 68    Quit date: 2010    Years since quitting: 15.3   Smokeless tobacco: Never  Vaping Use   Vaping status: Never Used  Substance and Sexual Activity   Alcohol use: Yes    Alcohol/week: 14.0 - 21.0 standard drinks of alcohol    Types: 14 - 21 Cans of beer per week    Comment: 01-09-2021  pt stated 2-3 (12oz) beers daily   Drug use: No   Sexual activity: Yes  Other Topics Concern   Not on file  Social History Narrative   Not on file   Social Drivers of Health   Financial Resource Strain: Low Risk  (10/24/2022)   Overall Financial Resource Strain (CARDIA)    Difficulty of Paying Living Expenses: Not hard at all  Food Insecurity: No Food Insecurity (01/13/2023)   Hunger Vital Sign    Worried About Running Out of Food in the Last Year: Never true    Ran Out of Food in the Last Year: Never true  Transportation Needs: No Transportation Needs (01/13/2023)   PRAPARE - Administrator, Civil Service (Medical): No    Lack of Transportation (Non-Medical): No  Physical Activity: Sufficiently Active (10/24/2022)   Exercise Vital Sign    Days of Exercise per Week: 4 days    Minutes of Exercise per Session: 120 min  Stress: No Stress Concern Present (10/24/2022)   [employer] of  Occupational Health - Occupational Stress Questionnaire    Feeling of Stress : Not at all  Social Connections: Moderately Isolated (10/24/2022)   Social Connection and Isolation Panel [NHANES]    Frequency of Communication with Friends and Family: More than three times a week    Frequency of Social Gatherings with Friends  and Family: More than three times a week    Attends Religious Services: Never    Active Member of Clubs or Organizations: No    Attends Banker Meetings: Never    Marital Status: Married  Catering manager Violence: Not At Risk (01/13/2023)   Humiliation, Afraid, Rape, and Kick questionnaire    Fear of Current or Ex-Partner: No    Emotionally Abused: No    Physically Abused: No    Sexually Abused: No   Family History  Problem Relation Age of Onset   Dementia Mother    Heart disease Father    Cancer Sister        d. 53   Breast cancer Sister 91       d. 54   Colon polyps Sister    Throat cancer Nephew        d. 18   Cancer Niece        NOS, d. 72   Colon cancer Neg Hx    Esophageal cancer Neg Hx    Stomach cancer Neg Hx    Rectal cancer Neg Hx       Review of Systems  Skin:  Positive for rash.  Neurological:  Positive for headaches.  All other systems reviewed and are negative.      Objective:   Physical Exam Vitals reviewed.  Constitutional:      General: He is not in acute distress.    Appearance: He is well-developed. He is not diaphoretic.  HENT:     Head: Normocephalic and atraumatic.      Mouth/Throat:     Pharynx: No oropharyngeal exudate.  Eyes:     Pupils: Pupils are equal, round, and reactive to light.  Neck:     Thyroid : No thyromegaly.     Vascular: No JVD.     Trachea: No tracheal deviation.  Cardiovascular:     Rate and Rhythm: Normal rate and regular rhythm.     Heart sounds: Normal heart sounds. No murmur heard.    No friction rub. No gallop.  Pulmonary:     Effort: Pulmonary effort is normal. No respiratory  distress.     Breath sounds: Normal breath sounds. No stridor. No wheezing or rales.  Chest:     Chest wall: No tenderness.  Skin:    General: Skin is warm.     Coloration: Skin is not pale.     Findings: Erythema and rash present. Rash is scaling.  Neurological:     Mental Status: He is alert.     Motor: No tremor, atrophy or abnormal muscle tone.     Deep Tendon Reflexes: Reflexes are normal and symmetric.           Assessment & Plan:  Seborrheic dermatitis I believe the patient is dealing with seborrheic dermatitis.  Begin Nizoral  shampoo.  Apply to the affected areas twice a week.  Allow to sit for 10 minutes then rinse off.  If not improving, consider zoryve.

## 2023-07-16 ENCOUNTER — Ambulatory Visit: Payer: Medicare Other

## 2023-07-16 ENCOUNTER — Telehealth: Payer: Self-pay | Admitting: Pharmacist Clinician (PhC)/ Clinical Pharmacy Specialist

## 2023-07-16 NOTE — Telephone Encounter (Signed)
 New Message:   Please call the patient  He says it is concerning his Ezetimibe 

## 2023-07-25 NOTE — Telephone Encounter (Signed)
 Pt concerned over cost of ezetimibe .  Has $18/3 month copay.  Most of his other medications are no charge and  he was hoping to get a similar product that might be free as well.   Explained that there is no similar medications but we could get a [employer] grant to cover his cost.  Goodrx price was $15 at [facility].  He did not want to switch pharmacies.    Will let us  know if this becomes cost prohibitive in the future.

## 2023-07-29 ENCOUNTER — Other Ambulatory Visit: Payer: Self-pay

## 2023-07-29 ENCOUNTER — Telehealth: Payer: Self-pay

## 2023-07-29 ENCOUNTER — Ambulatory Visit: Payer: Self-pay | Admitting: Gastroenterology

## 2023-07-29 DIAGNOSIS — I639 Cerebral infarction, unspecified: Secondary | ICD-10-CM

## 2023-07-29 DIAGNOSIS — I2510 Atherosclerotic heart disease of native coronary artery without angina pectoris: Secondary | ICD-10-CM

## 2023-07-29 MED ORDER — CLOPIDOGREL BISULFATE 75 MG PO TABS
75.0000 mg | ORAL_TABLET | Freq: Every day | ORAL | 1 refills | Status: DC
Start: 2023-07-29 — End: 2024-01-20

## 2023-07-29 NOTE — Telephone Encounter (Signed)
 Pt came into office to request for this med to now be sent over to pharmacy listed:  clopidogrel  (PLAVIX ) 75 MG tablet [[id number]]  Pt stated that since changing pharmacy's, this rx has to be renewed/resent by pcp. Please advise.  LOV: 07/04/23  PHARMACY: [id number] - [place], [place] - [address], [place] [address] Phone: [phone]  Fax: [phone]

## 2023-08-16 IMAGING — DX DG CHEST 2V
2 series · 2 of 2 positions shown · non-contrast
Comparison: 07/02/2016

CLINICAL DATA: Preop radiation seed implant

EXAM:
CHEST - 2 VIEW

[chest pa]
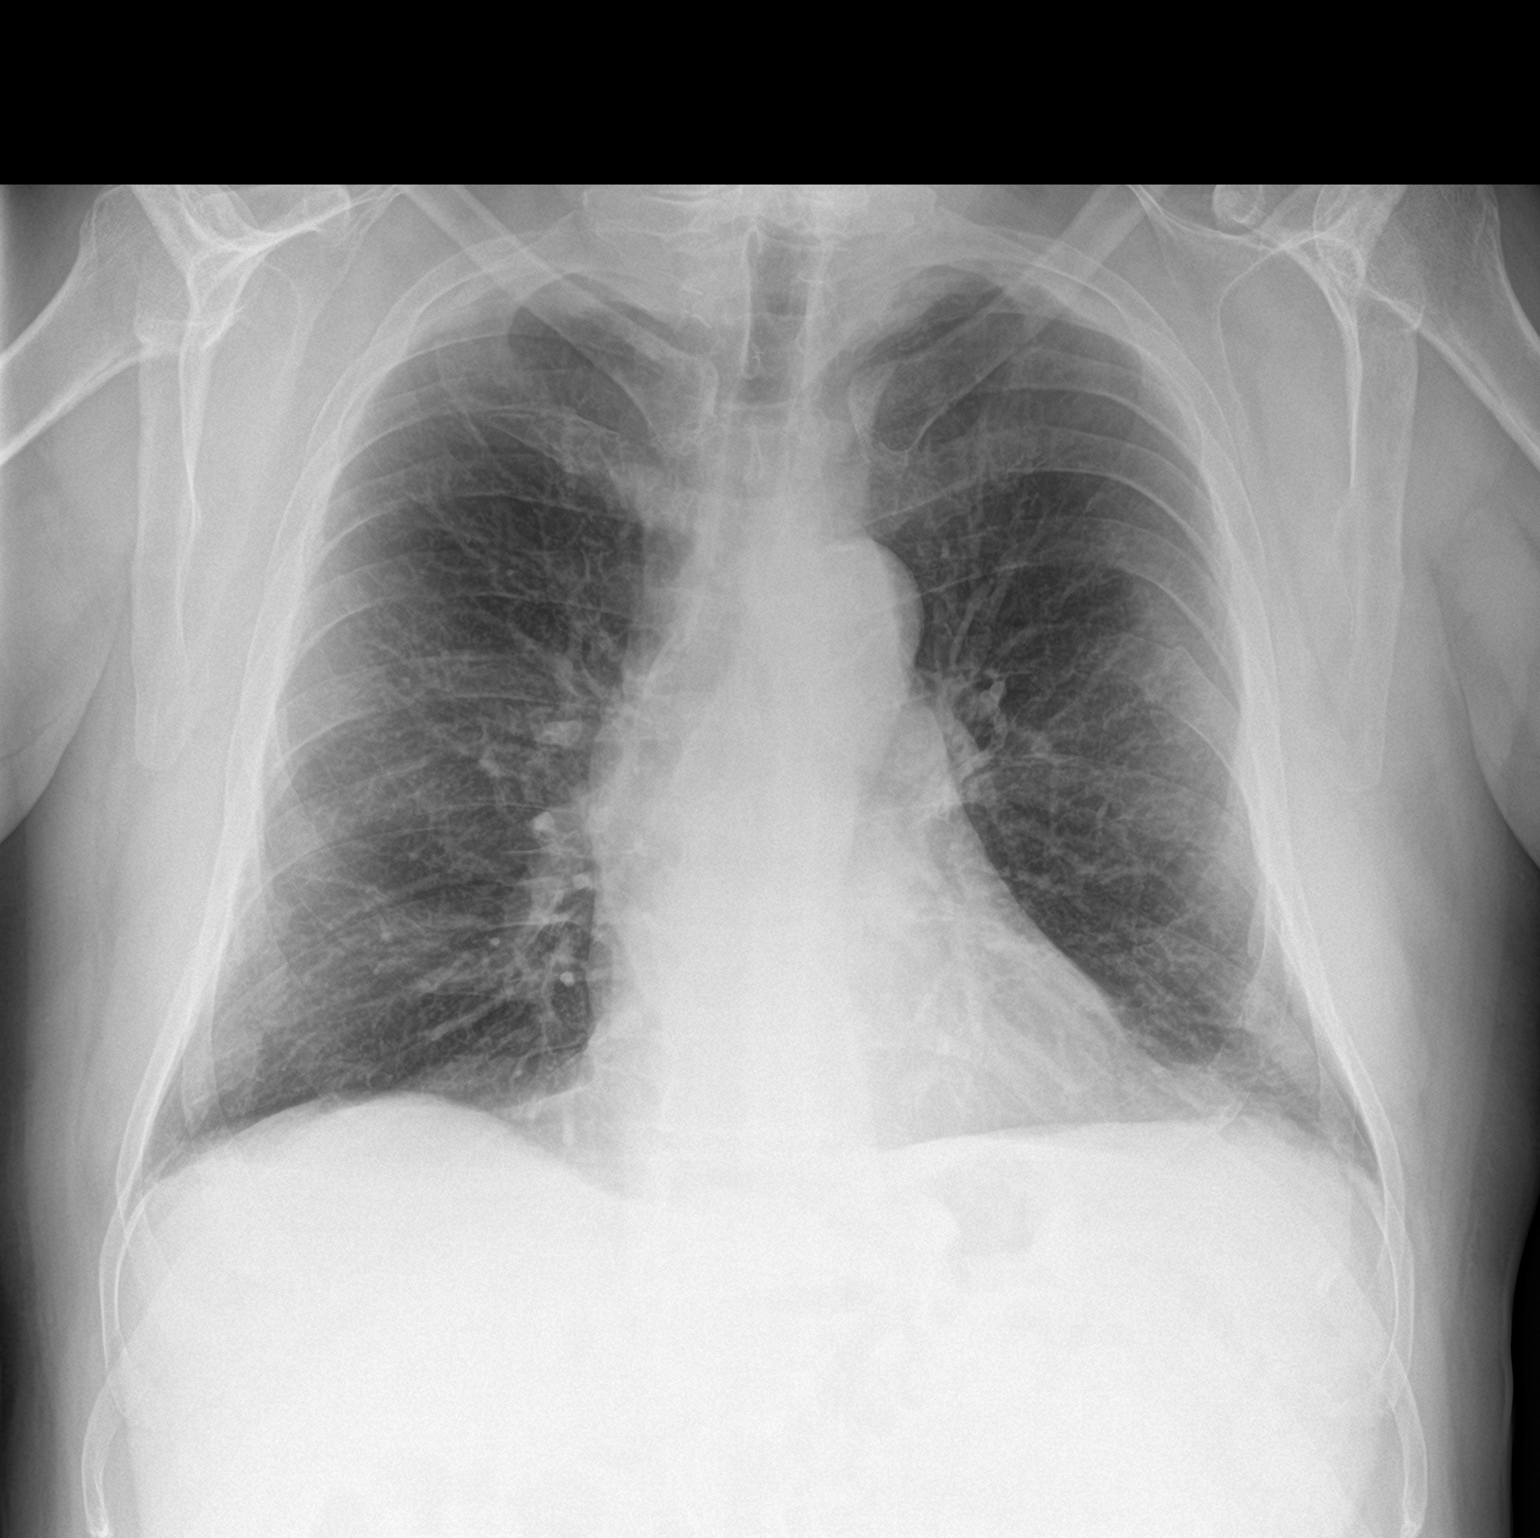

[chest lat]
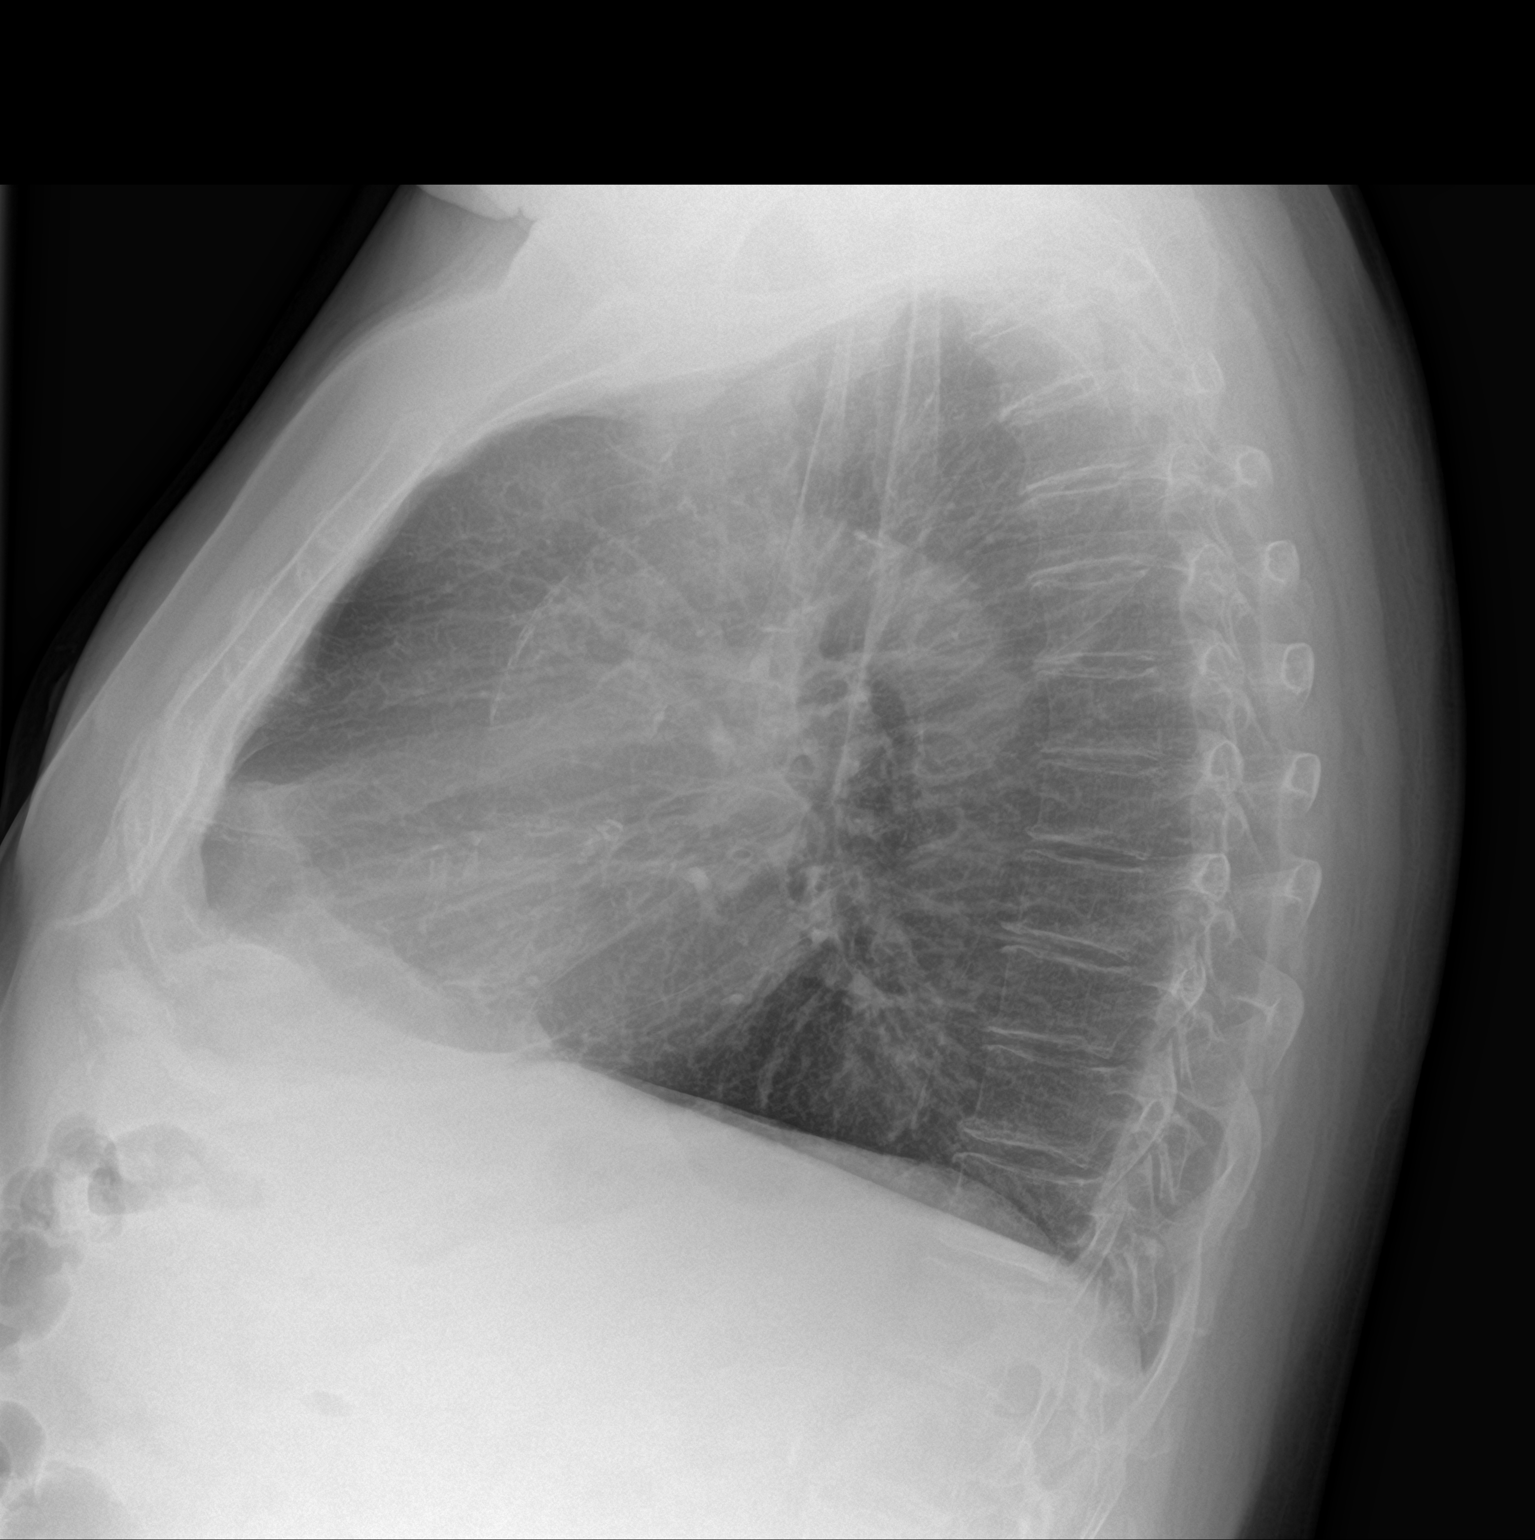

[2 of 2 positions shown; findings below may reference images not displayed]

FINDINGS: Heart and mediastinal contours are within normal limits. No focal
opacities or effusions. No acute bony abnormality.
IMPRESSION: No active cardiopulmonary disease.

## 2023-09-02 ENCOUNTER — Other Ambulatory Visit: Payer: Self-pay | Admitting: Family Medicine

## 2023-10-09 ENCOUNTER — Encounter: Payer: Self-pay | Admitting: Pharmacist

## 2023-10-09 NOTE — Progress Notes (Signed)
 Pharmacy Quality Measure Review  This patient is appearing on a report for being at risk of failing the adherence measure for cholesterol (statin) medications this calendar year.   Medication: atorvastatin  20 mg  Last fill date: 6/15 for 90 day supply  Insurance report was not up to date. No action needed at this time.   Catie IVAR Centers, PharmD, [facility] Clinical Pharmacist [phone]

## 2023-10-16 ENCOUNTER — Encounter: Payer: Self-pay | Admitting: Family Medicine

## 2023-10-16 ENCOUNTER — Ambulatory Visit: Admitting: Family Medicine

## 2023-10-16 VITALS — BP 132/82 | HR 65 | Temp 98.6°F | Ht 68.0 in | Wt 220.1 lb

## 2023-10-16 DIAGNOSIS — A46 Erysipelas: Secondary | ICD-10-CM

## 2023-10-16 MED ORDER — CEPHALEXIN 500 MG PO CAPS: 500.0000 mg | ORAL_CAPSULE | Freq: Four times a day (QID) | ORAL | 0 refills | Status: DC

## 2023-10-16 MED ORDER — CEPHALEXIN 500 MG PO CAPS: 500.0000 mg | ORAL_CAPSULE | Freq: Four times a day (QID) | ORAL | 0 refills | Status: AC

## 2023-10-16 NOTE — Progress Notes (Signed)
 Subjective:  HPI: Todd Patterson is a 73 y.o. male presenting on 10/16/2023 for Facial Swelling (Pt states he had a pimple appear on his face 1 week ago. This morning he popped it and liquid/pus came out and now his face is swollen. )   HPI Patient is in today for a concerning large pimple that appeared on his left cheek 1 week ago and continued to grow. It came to a head last night and he was able to drain purulent fluid from it this AM. The area is red and warm. Had a similar occurrence a year ago on his forehead that required draining and he is concerned this may need to be lanced. He denies fever, chills, body aches.  Review of Systems  All other systems reviewed and are negative.   Relevant past medical history reviewed and updated as indicated.   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   neurologist-- dr onita;  09-05-2017 acute onset right 6th nerve palsy with ischemic event,  started on plavix ;  06/ 2021 left 6th nerve palsy probable same etiology as the right   Anticoagulant long-term use    plavix ---  managed by pcp   Arthritis    Colon polyps    Coronary artery disease    cardiologist--- dr court; 2010  hx MI s/p cath with DES x1  to LAD per cardiology note done in [place]    Dental bridge present    x2 upper  both permanent   Family history of breast cancer    Glaucoma, both eyes    History of COVID-19 03/21/2020   result in epic   History of myocardial infarction 2010   PCI w/ DES to LAD   Hx of colonic polyps    colonoscopy 2018, recommended repeat in 1 year due to numbe rof polyps.    Hyperlipidemia    Hypertension    Malignant neoplasm prostate [facility])    urologist-- dr gay;  dx 06/ 2022, gleason 3+3, PSA 8.82   Polyposis coli    Primary hyperparathyroidism (HCC)    S/P drug eluting coronary stent placement 2010   to LAD     Past Surgical History:  Procedure Laterality Date   APPENDECTOMY  1974   age 10   COLONOSCOPY      CORONARY ANGIOPLASTY WITH STENT PLACEMENT  2010   DES x1 LAD  (in [place] )   CYSTOSCOPY N/A 01/15/2021   Procedure: CYSTOSCOPY FLEXIBLE;  Surgeon: Selma Donnice JONELLE, MD;  Location: [facility];  Service: Urology;  Laterality: N/A;  No seeds detected in bladder per Dr. Selma   PARATHYROIDECTOMY N/A 07/04/2016   Procedure: PARATHYROIDECTOMY;  Surgeon: Krystal Russell, MD;  Location[facility];  Service: General;  Laterality: N/A;   RADIOACTIVE SEED IMPLANT N/A 01/15/2021   Procedure: RADIOACTIVE SEED IMPLANT/BRACHYTHERAPY IMPLANT;  Surgeon: Selma Donnice JONELLE, MD;  Location: [facility];  Service: Urology;  Laterality: N/A;   SPACE OAR INSTILLATION N/A 01/15/2021   Procedure: SPACE OAR INSTILLATION;  Surgeon: Selma Donnice JONELLE, MD;  Location: [facility];  Service: Urology;  Laterality: N/A;   TONSILLECTOMY AND ADENOIDECTOMY  1970   age 19    Allergies and medications reviewed and updated.   Current Outpatient Medications:    atorvastatin  (LIPITOR) 80 MG tablet, TAKE 1 TABLET(80 MG) BY MOUTH DAILY, Disp: 90 tablet, Rfl: 1   carvedilol  (COREG ) 6.25 MG tablet, Take 1 tablet (6.25 mg total) by  mouth 2 (two) times daily with a meal., Disp: 180 tablet, Rfl: 3   clopidogrel  (PLAVIX ) 75 MG tablet, Take 1 tablet (75 mg total) by mouth daily., Disp: 90 tablet, Rfl: 1   ezetimibe  (ZETIA ) 10 MG tablet, Take 1 tablet (10 mg total) by mouth daily., Disp: 90 tablet, Rfl: 3   hydrochlorothiazide  (HYDRODIURIL ) 25 MG tablet, Take 1 tablet (25 mg total) by mouth daily., Disp: 90 tablet, Rfl: 3   ibuprofen (ADVIL) 200 MG tablet, Take 600 mg by mouth every 6 (six) hours as needed for mild pain (pain score 1-3) or headache., Disp: , Rfl:    ketoconazole  (NIZORAL ) 2 % shampoo, Apply 1 Application topically 2 (two) times a week., Disp: 120 mL, Rfl: 11   latanoprost  (XALATAN ) 0.005 % ophthalmic solution, Place 1 drop into both eyes at bedtime., Disp: , Rfl:    losartan  (COZAAR ) 100  MG tablet, Take 1 tablet (100 mg total) by mouth daily., Disp: 90 tablet, Rfl: 3   nitroGLYCERIN  (NITROSTAT ) 0.4 MG SL tablet, Place 1 tablet (0.4 mg total) under the tongue every 5 (five) minutes as needed for chest pain., Disp: 25 tablet, Rfl: 1   pantoprazole  (PROTONIX ) 40 MG tablet, Take 1 tablet (40 mg total) by mouth daily., Disp: 90 tablet, Rfl: 0   SHINGRIX  injection, , Disp: , Rfl:    tamsulosin  (FLOMAX ) 0.4 MG CAPS capsule, Take 0.4 mg by mouth at bedtime., Disp: , Rfl:    timolol  (BETIMOL ) 0.5 % ophthalmic solution, Place 1 drop into both eyes in the morning., Disp: , Rfl:    cephALEXin  (KEFLEX ) 500 MG capsule, Take 1 capsule (500 mg total) by mouth every 6 (six) hours for 5 days., Disp: 20 capsule, Rfl: 0  Allergies  Allergen Reactions   Lisinopril  Cough    Objective:   BP 132/82   Pulse 65   Temp 98.6 F (37 C)   Ht 5' 8 (1.727 m)   Wt 220 lb 2 oz (99.8 kg)   SpO2 99%   BMI 33.47 kg/m      10/16/2023    7:58 AM 07/04/2023   10:52 AM 06/06/2023   10:56 AM  Vitals with BMI  Height 5' 8 5' 8   Weight 220 lbs 2 oz 224 lbs 10 oz   BMI 33.48 34.16   Systolic 132 142 881  Diastolic 82 86 76  Pulse 65 57 53     Physical Exam Vitals and nursing note reviewed.  Constitutional:      Appearance: Normal appearance. He is normal weight.  HENT:     Head: Normocephalic and atraumatic.  Skin:    General: Skin is warm and dry.     Capillary Refill: Capillary refill takes less than 2 seconds.     Comments: 0.75 x 1.25cm area of redness and induration  Neurological:     General: No focal deficit present.     Mental Status: He is alert and oriented to person, place, and time. Mental status is at baseline.  Psychiatric:        Mood and Affect: Mood normal.        Behavior: Behavior normal.        Thought Content: Thought content normal.        Judgment: Judgment normal.     Assessment & Plan:  Erysipelas Assessment & Plan: Recommended warm compress and monitor  for worsening symptoms including fever, chills, malaise. Start Keflex  500mg  every 6hours x5d. Return if symptoms persist or worsen.  Other orders -     Cephalexin ; Take 1 capsule (500 mg total) by mouth every 6 (six) hours for 5 days.  Dispense: 20 capsule; Refill: 0     Follow up plan: Return if symptoms worsen or fail to improve.  Jeoffrey GORMAN Barrio, FNP

## 2023-10-16 NOTE — Assessment & Plan Note (Signed)
 Recommended warm compress and monitor for worsening symptoms including fever, chills, malaise. Start Keflex  500mg  every 6hours x5d. Return if symptoms persist or worsen.

## 2023-10-17 ENCOUNTER — Other Ambulatory Visit: Payer: Self-pay | Admitting: Family Medicine

## 2023-10-17 ENCOUNTER — Other Ambulatory Visit: Payer: Self-pay | Admitting: Cardiovascular Disease

## 2023-10-17 NOTE — Telephone Encounter (Signed)
 Requested Prescriptions  Pending Prescriptions Disp Refills   carvedilol  (COREG ) 6.25 MG tablet [Pharmacy Med Name: CARVEDILOL  6.25MG  TABLETS] 180 tablet 0    Sig: TAKE 1 TABLET BY MOUTH 2 TIMES DAILY WITH A MEAL     Cardiovascular: Beta Blockers 3 Passed - 10/17/2023  2:52 PM      Passed - Cr in normal range and within 360 days    Creat  Date Value Ref Range Status  04/21/2023 0.84 0.70 - 1.28 mg/dL Final         Passed - AST in normal range and within 360 days    AST  Date Value Ref Range Status  04/21/2023 16 10 - 35 U/L Final         Passed - ALT in normal range and within 360 days    ALT  Date Value Ref Range Status  04/21/2023 23 9 - 46 U/L Final         Passed - Last BP in normal range    BP Readings from Last 1 Encounters:  10/16/23 132/82         Passed - Last Heart Rate in normal range    Pulse Readings from Last 1 Encounters:  10/16/23 65         Passed - Valid encounter within last 6 months    Recent Outpatient Visits           Yesterday Erysipelas   [place] [facility] Kayla Jeoffrey RAMAN, FNP   3 months ago Seborrheic dermatitis   [place] [facility] Duanne, Butler DASEN, MD   5 months ago Need for vaccination   [place] [facility] Duanne Butler DASEN, MD   8 months ago Chest pain, unspecified type   [place] [facility] Duanne Butler DASEN, MD   9 months ago Coronary artery disease involving native coronary artery of native heart without angina pectoris   [place] [facility] Pickard, Butler DASEN, MD               hydrochlorothiazide  (HYDRODIURIL ) 25 MG tablet [Pharmacy Med Name: HYDROCHLOROTHIAZIDE  25MG  TABLETS] 90 tablet 0    Sig: TAKE 1 TABLET BY MOUTH DAILY     Cardiovascular: Diuretics - Thiazide Passed - 10/17/2023  2:52 PM      Passed - Cr in normal range and within 180 days    Creat  Date Value Ref Range Status  04/21/2023 0.84 0.70 - 1.28 mg/dL  Final         Passed - K in normal range and within 180 days    Potassium  Date Value Ref Range Status  04/21/2023 3.8 3.5 - 5.3 mmol/L Final         Passed - Na in normal range and within 180 days    Sodium  Date Value Ref Range Status  04/21/2023 140 135 - 146 mmol/L Final  01/21/2023 143 134 - 144 mmol/L Final         Passed - Last BP in normal range    BP Readings from Last 1 Encounters:  10/16/23 132/82         Passed - Valid encounter within last 6 months    Recent Outpatient Visits           Yesterday Erysipelas   [place] Kayla Jeoffrey RAMAN, FNP   3 months ago Seborrheic dermatitis   [place] [facility] Duanne Butler DASEN, MD   5 months ago Need for vaccination   [place] [facility] [facility] Duanne Butler DASEN, MD   8 months ago Chest pain, unspecified type   [place] [facility] Duanne Butler DASEN, MD   9 months ago Coronary artery disease involving native coronary artery of native heart without angina pectoris   [place] [facility] Pickard, Butler DASEN, MD

## 2023-10-21 DIAGNOSIS — C61 Malignant neoplasm of prostate: Secondary | ICD-10-CM | POA: Diagnosis not present

## 2023-10-29 DIAGNOSIS — N401 Enlarged prostate with lower urinary tract symptoms: Secondary | ICD-10-CM | POA: Diagnosis not present

## 2023-10-29 DIAGNOSIS — R351 Nocturia: Secondary | ICD-10-CM | POA: Diagnosis not present

## 2023-10-29 DIAGNOSIS — C61 Malignant neoplasm of prostate: Secondary | ICD-10-CM | POA: Diagnosis not present

## 2023-12-08 ENCOUNTER — Other Ambulatory Visit: Payer: Self-pay | Admitting: Family Medicine

## 2023-12-09 ENCOUNTER — Other Ambulatory Visit: Payer: Self-pay

## 2023-12-09 DIAGNOSIS — I639 Cerebral infarction, unspecified: Secondary | ICD-10-CM

## 2023-12-09 DIAGNOSIS — E7800 Pure hypercholesterolemia, unspecified: Secondary | ICD-10-CM

## 2023-12-09 DIAGNOSIS — I2510 Atherosclerotic heart disease of native coronary artery without angina pectoris: Secondary | ICD-10-CM

## 2023-12-09 MED ORDER — ATORVASTATIN CALCIUM 80 MG PO TABS: 80.0000 mg | ORAL_TABLET | Freq: Every day | ORAL | 1 refills | Status: DC

## 2023-12-09 NOTE — Telephone Encounter (Signed)
 Requested Prescriptions  Refused Prescriptions Disp Refills   atorvastatin  (LIPITOR) 80 MG tablet [Pharmacy Med Name: ATORVASTATIN  80MG  TABLETS] 90 tablet 1    Sig: TAKE 1 TABLET(80 MG) BY MOUTH DAILY     Cardiovascular:  Antilipid - Statins Failed - 12/09/2023 10:18 AM      Failed - Lipid Panel in normal range within the last 12 months    Cholesterol, Total  Date Value Ref Range Status  07/08/2019 192 100 - 199 mg/dL Final   Cholesterol  Date Value Ref Range Status  04/21/2023 146 <200 mg/dL Final   LDL Cholesterol (Calc)  Date Value Ref Range Status  04/21/2023 66 mg/dL (calc) Final    Comment:    Reference range: <100 . Desirable range <100 mg/dL for primary prevention;   <70 mg/dL for patients with CHD or diabetic patients  with > or = 2 CHD risk factors. SABRA LDL-C is now calculated using the Martin-Hopkins  calculation, which is a validated novel method providing  better accuracy than the Friedewald equation in the  estimation of LDL-C.  Gladis APPLETHWAITE et al. SANDREA. 7986;689(80): 2061-2068  (http://education.[facility].com/faq/FAQ164)    HDL  Date Value Ref Range Status  04/21/2023 58 > OR = 40 mg/dL Final  95/77/7978 65 >60 mg/dL Final   Triglycerides  Date Value Ref Range Status  04/21/2023 136 <150 mg/dL Final         Passed - Patient is not pregnant      Passed - Valid encounter within last 12 months    Recent Outpatient Visits           1 month ago Erysipelas   [place] [facility] Kayla Jeoffrey RAMAN, FNP   5 months ago Seborrheic dermatitis   [place] [facility] Duanne, Butler DASEN, MD   7 months ago Need for vaccination   [facility] Duanne Butler DASEN, MD   10 months ago Chest pain, unspecified type    [facility] Duanne Butler DASEN, MD   11 months ago Coronary artery disease involving native coronary artery of native heart without angina pectoris   [facility] Pickard, Butler DASEN, MD

## 2023-12-10 ENCOUNTER — Other Ambulatory Visit: Payer: Self-pay

## 2023-12-10 ENCOUNTER — Ambulatory Visit

## 2023-12-10 DIAGNOSIS — I2510 Atherosclerotic heart disease of native coronary artery without angina pectoris: Secondary | ICD-10-CM

## 2023-12-10 DIAGNOSIS — E7800 Pure hypercholesterolemia, unspecified: Secondary | ICD-10-CM

## 2023-12-10 DIAGNOSIS — I639 Cerebral infarction, unspecified: Secondary | ICD-10-CM

## 2023-12-10 MED ORDER — ATORVASTATIN CALCIUM 80 MG PO TABS: 80.0000 mg | ORAL_TABLET | Freq: Every day | ORAL | 1 refills | Status: AC

## 2024-01-07 DIAGNOSIS — K080 Exfoliation of teeth due to systemic causes: Secondary | ICD-10-CM | POA: Diagnosis not present

## 2024-01-11 ENCOUNTER — Other Ambulatory Visit: Payer: Self-pay | Admitting: Family Medicine

## 2024-01-14 ENCOUNTER — Other Ambulatory Visit: Payer: Self-pay | Admitting: Family Medicine

## 2024-01-18 ENCOUNTER — Other Ambulatory Visit: Payer: Self-pay | Admitting: Family Medicine

## 2024-01-18 DIAGNOSIS — I2510 Atherosclerotic heart disease of native coronary artery without angina pectoris: Secondary | ICD-10-CM

## 2024-01-18 DIAGNOSIS — I639 Cerebral infarction, unspecified: Secondary | ICD-10-CM

## 2024-03-02 ENCOUNTER — Other Ambulatory Visit: Payer: Self-pay | Admitting: Family Medicine

## 2024-03-02 ENCOUNTER — Other Ambulatory Visit: Payer: Self-pay | Admitting: Cardiovascular Disease

## 2024-03-03 ENCOUNTER — Ambulatory Visit (INDEPENDENT_AMBULATORY_CARE_PROVIDER_SITE_OTHER)

## 2024-03-03 DIAGNOSIS — Z23 Encounter for immunization: Secondary | ICD-10-CM | POA: Diagnosis not present

## 2024-03-03 NOTE — Progress Notes (Signed)
 Patient is in office today for a nurse visit for Immunization. Patient Injection was given in the  Right deltoid. Patient tolerated injection well.

## 2024-04-29 ENCOUNTER — Ambulatory Visit

## 2024-04-29 ENCOUNTER — Ambulatory Visit: Admitting: Family Medicine
